# Patient Record
Sex: Male | Born: 1955 | Race: White | Hispanic: No | Marital: Married | Smoking: Never smoker
Health system: Southern US, Community
[De-identification: ages and names within clinical notes are randomized; demographics above are authoritative.]

## PROBLEM LIST (undated history)

## (undated) DIAGNOSIS — R7303 Prediabetes: Secondary | ICD-10-CM

## (undated) DIAGNOSIS — K219 Gastro-esophageal reflux disease without esophagitis: Secondary | ICD-10-CM

## (undated) DIAGNOSIS — E559 Vitamin D deficiency, unspecified: Secondary | ICD-10-CM

## (undated) DIAGNOSIS — E291 Testicular hypofunction: Secondary | ICD-10-CM

## (undated) DIAGNOSIS — I10 Essential (primary) hypertension: Secondary | ICD-10-CM

## (undated) DIAGNOSIS — E785 Hyperlipidemia, unspecified: Secondary | ICD-10-CM

## (undated) HISTORY — DX: Essential (primary) hypertension: I10

## (undated) HISTORY — DX: Gastro-esophageal reflux disease without esophagitis: K21.9

## (undated) HISTORY — DX: Prediabetes: R73.03

## (undated) HISTORY — DX: Hyperlipidemia, unspecified: E78.5

## (undated) HISTORY — DX: Vitamin D deficiency, unspecified: E55.9

## (undated) HISTORY — DX: Testicular hypofunction: E29.1

---

## 2000-02-28 ENCOUNTER — Encounter: Payer: Self-pay | Admitting: Emergency Medicine

## 2000-02-28 ENCOUNTER — Emergency Department (HOSPITAL_COMMUNITY): Admission: EM | Admit: 2000-02-28 | Discharge: 2000-02-28 | Payer: Self-pay | Admitting: Emergency Medicine

## 2003-11-10 ENCOUNTER — Ambulatory Visit (HOSPITAL_COMMUNITY): Admission: RE | Admit: 2003-11-10 | Discharge: 2003-11-10 | Payer: Self-pay | Admitting: Internal Medicine

## 2003-11-12 ENCOUNTER — Ambulatory Visit (HOSPITAL_COMMUNITY): Admission: RE | Admit: 2003-11-12 | Discharge: 2003-11-12 | Payer: Self-pay | Admitting: Internal Medicine

## 2004-01-26 HISTORY — PX: VASECTOMY: SHX75

## 2005-03-27 HISTORY — PX: URETHRAL DILATION: SUR417

## 2005-09-22 ENCOUNTER — Ambulatory Visit: Payer: Self-pay

## 2006-01-23 ENCOUNTER — Encounter: Admission: RE | Admit: 2006-01-23 | Discharge: 2006-01-23 | Payer: Self-pay | Admitting: Urology

## 2006-01-24 ENCOUNTER — Ambulatory Visit (HOSPITAL_BASED_OUTPATIENT_CLINIC_OR_DEPARTMENT_OTHER): Admission: RE | Admit: 2006-01-24 | Discharge: 2006-01-24 | Payer: Self-pay | Admitting: Urology

## 2007-11-08 IMAGING — CR DG CHEST 2V
2 series · 2 of 2 positions shown · non-contrast
Comparison: none

HISTORY: Urethral stricture, preoperative evaluation

CHEST 2 VIEWS:
Comparison 11/09/2005
Normal heart size, mediastinal contours, pulmonary vascularity.
Minimal prominence at left hilum stable.
Minimal scarring left lung base stable.
No infiltrate, effusion, or mass.
Bones unremarkable.
Very minimal chronic peribronchial thickening noted.

[view not recorded (1 of 2)]
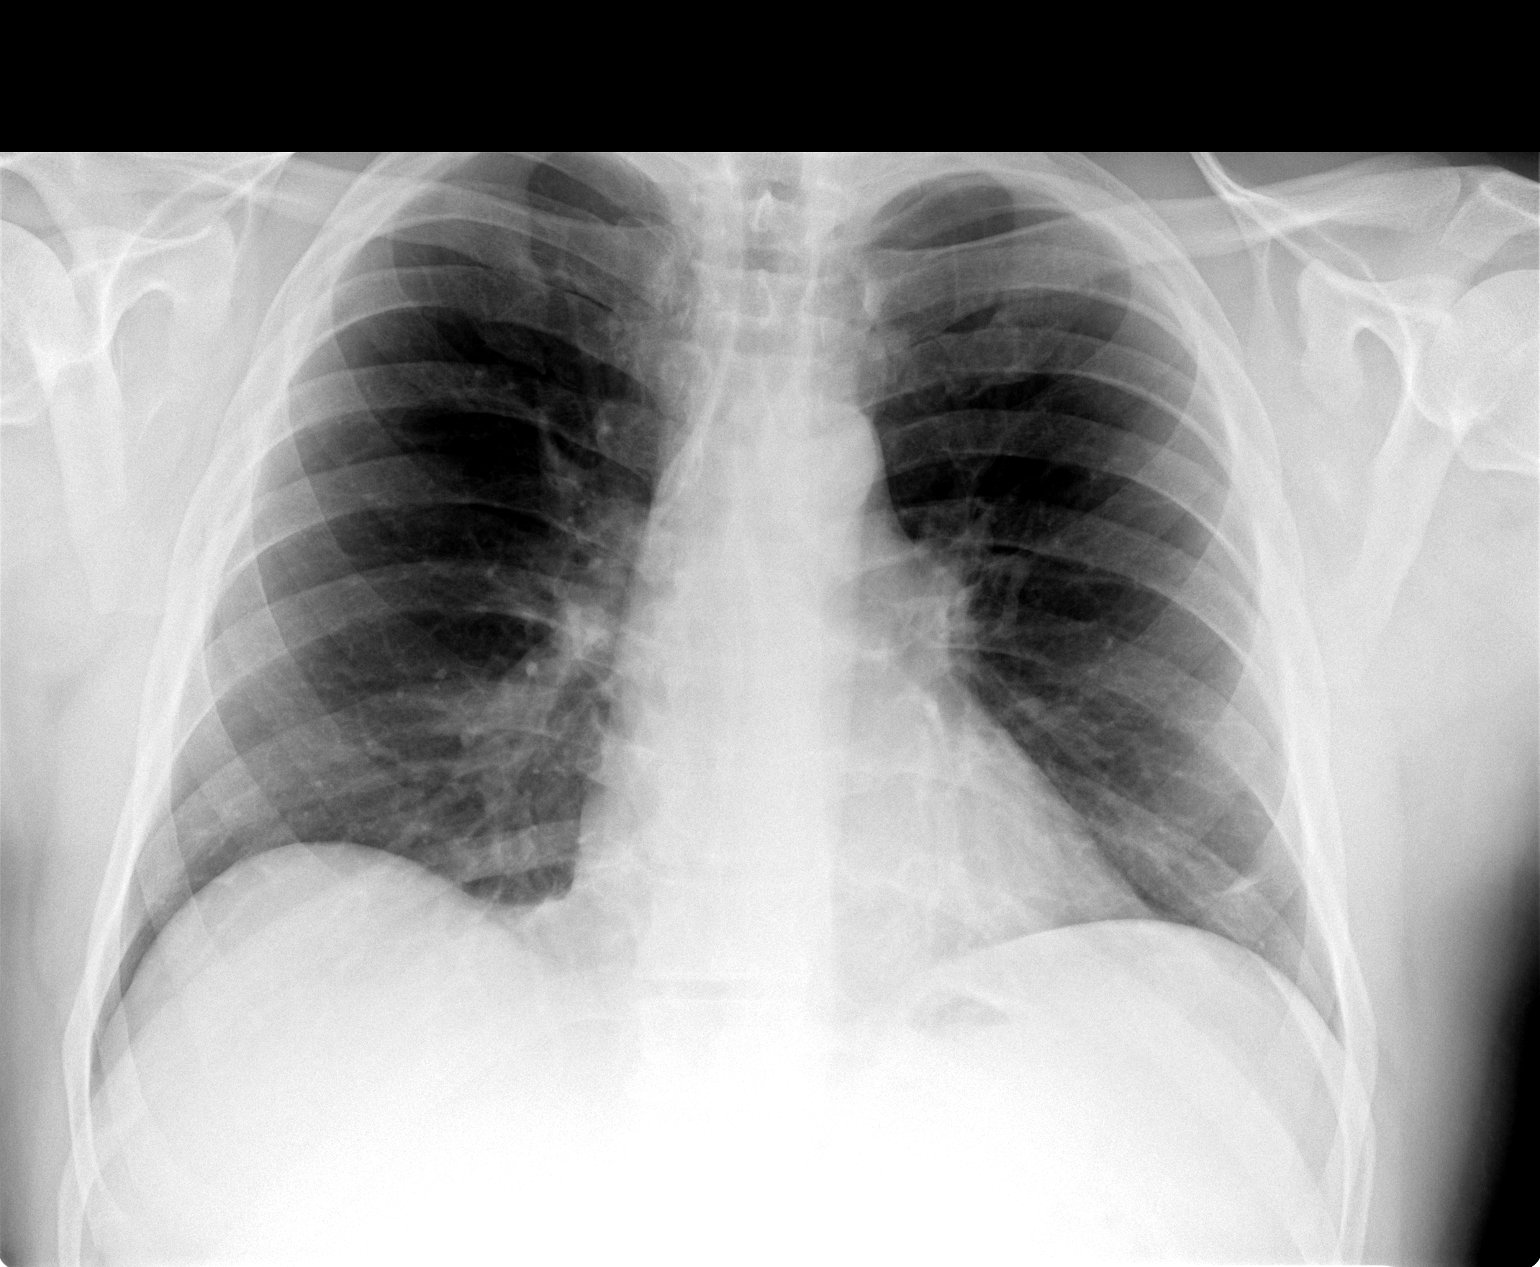

[view not recorded (2 of 2)]
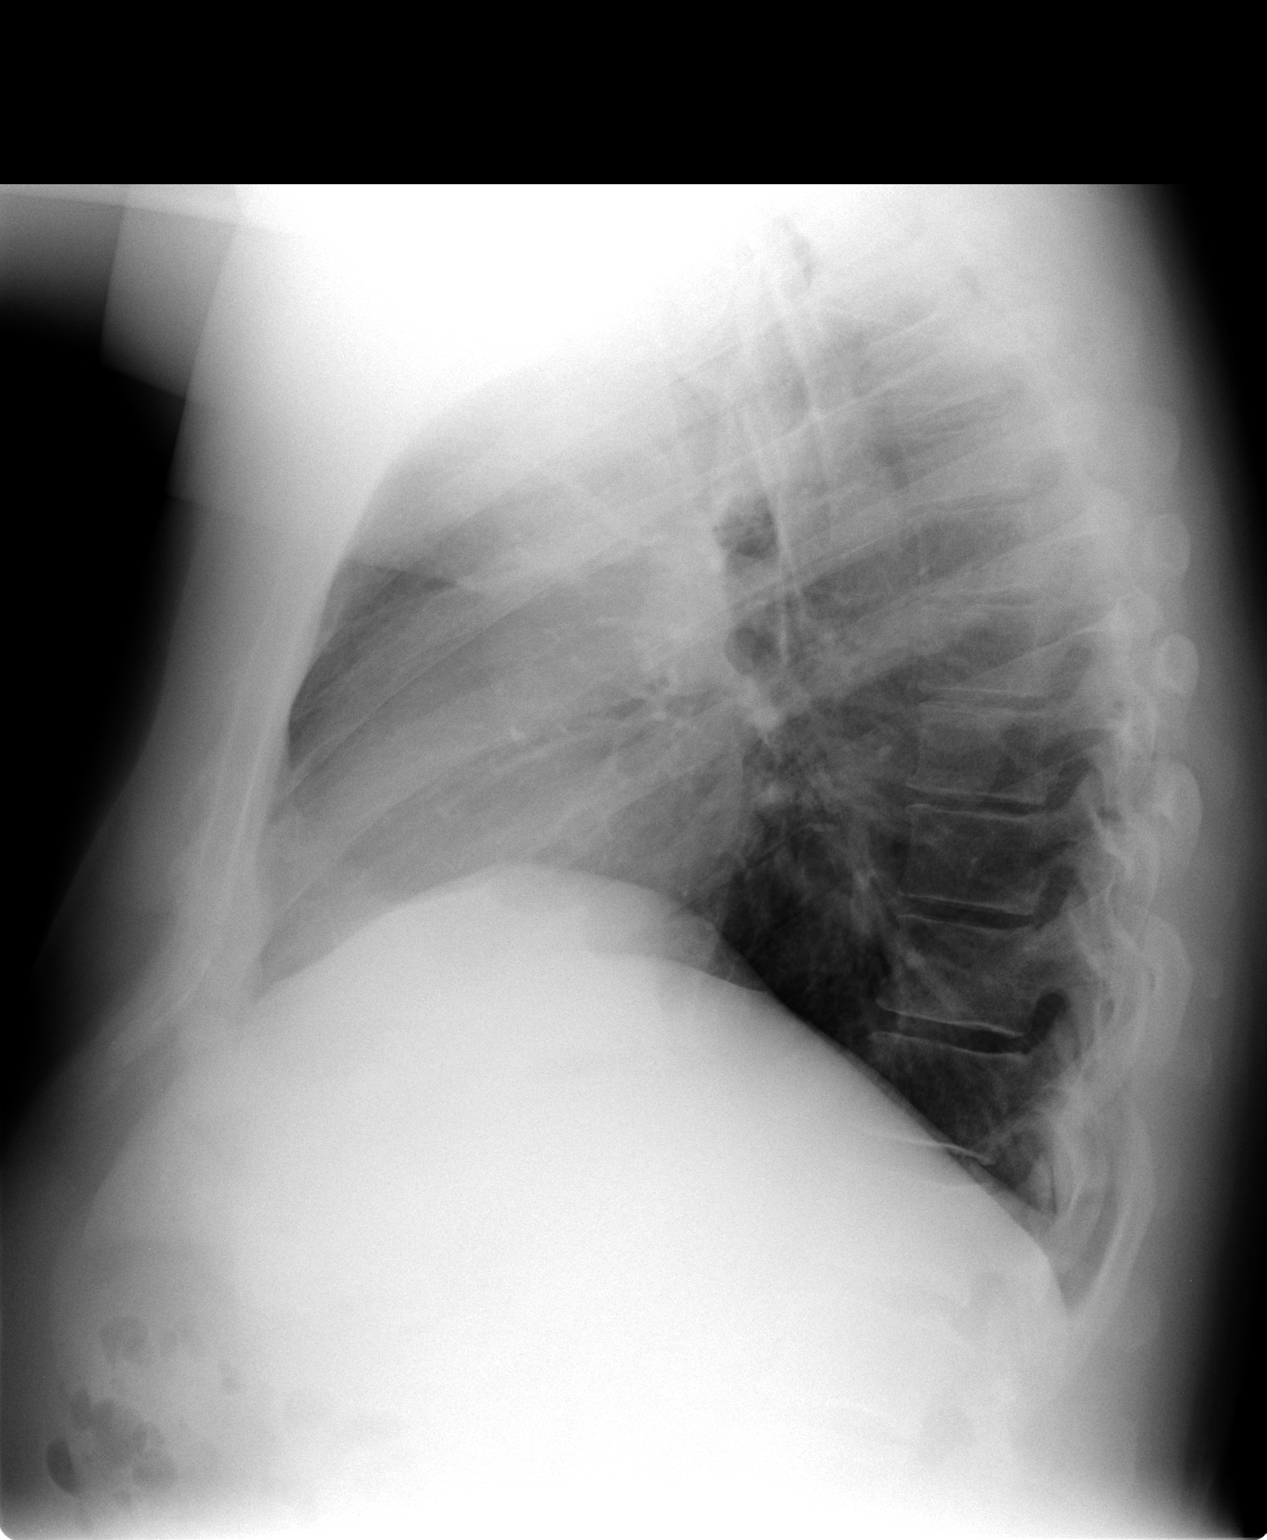

[2 of 2 positions shown; findings below may reference images not displayed]

IMPRESSION: No acute abnormalities as above.

## 2009-10-27 ENCOUNTER — Encounter (INDEPENDENT_AMBULATORY_CARE_PROVIDER_SITE_OTHER): Payer: Self-pay | Admitting: *Deleted

## 2009-10-28 ENCOUNTER — Ambulatory Visit: Payer: Self-pay | Admitting: Gastroenterology

## 2009-11-04 ENCOUNTER — Ambulatory Visit: Payer: Self-pay | Admitting: Gastroenterology

## 2009-11-08 ENCOUNTER — Encounter: Payer: Self-pay | Admitting: Gastroenterology

## 2010-04-28 NOTE — Miscellaneous (Signed)
Summary: [facility] Previsit/prep  Clinical Lists Changes  Medications: Added new medication of MOVIPREP 100 GM  SOLR (PEG-KCL-NACL-NASULF-NA ASC-C) As per prep instructions. - Signed Rx of MOVIPREP 100 GM  SOLR (PEG-KCL-NACL-NASULF-NA ASC-C) As per prep instructions.;  #1 x 0;  Signed;  Entered by: Wyona Almas RN;  Authorized by: Meryl Dare MD [facility];  Method used: Electronically to [facility] [address] [place], [place], [place]  [address], Ph: [phone], Fax: [phone] Allergies: Added new allergy or adverse reaction of CODEINE Observations: Added new observation of NKA: F (10/28/2009 16:12)    Prescriptions: MOVIPREP 100 GM  SOLR (PEG-KCL-NACL-NASULF-NA ASC-C) As per prep instructions.  #1 x 0   Entered by:   Wyona Almas RN   Authorized by:   Meryl Dare MD [facility]   Signed by:   Wyona Almas RN on 10/28/2009   Method used:   Electronically to        [facility] (retail)       [address]       [place], [place]  [address]       Ph: [phone]       Fax: [phone]   RxID:   [id number]

## 2010-04-28 NOTE — Letter (Signed)
Summary: [facility] Instructions  [facility]  [address] [place], [place] [address]   Phone: [phone]  Fax: [phone]       Mark [name]    April 01, 1955    MRN: [id number]        Procedure Day [name]:  Mark [name]  11/04/09     Arrival Time:  7:30AM     Procedure Time:  8:30AM     Location of Procedure:                    Mark [name]  [facility] (4th Floor)                       PREPARATION FOR COLONOSCOPY WITH MOVIPREP   Starting 5 days prior to your procedure 10/30/09 do not eat nuts, seeds, popcorn, corn, beans, peas,  salads, or any raw vegetables.  Do not take any fiber supplements (e.g. Metamucil, Citrucel, and Benefiber).  THE DAY BEFORE YOUR PROCEDURE         DATE: 11/03/09  DAY: WEDNESDAY  1.  Drink clear liquids the entire day-NO SOLID FOOD  2.  Do not drink anything colored red or purple.  Avoid juices with pulp.  No orange juice.  3.  Drink at least 64 oz. (8 glasses) of fluid/clear liquids during the day to prevent dehydration and help the prep work efficiently.  CLEAR LIQUIDS INCLUDE: Water Jello Ice Popsicles Tea (sugar ok, no milk/cream) Powdered fruit flavored drinks Coffee (sugar ok, no milk/cream) Gatorade Juice: apple, white grape, white cranberry  Lemonade Clear bullion, consomm, broth Carbonated beverages (any kind) Strained chicken noodle soup Hard Candy                             4.  In the morning, mix first dose of MoviPrep solution:    Empty 1 Pouch A and 1 Pouch B into the disposable container    Add lukewarm drinking water to the top line of the container. Mix to dissolve    Refrigerate (mixed solution should be used within 24 hrs)  5.  Begin drinking the prep at 5:00 p.m. The MoviPrep container is divided by 4 marks.   Every 15 minutes drink the solution down to the next mark (approximately 8 oz) until the full liter is complete.   6.  Follow completed prep with 16 oz of clear liquid of your choice (Nothing  red or purple).  Continue to drink clear liquids until bedtime.  7.  Before going to bed, mix second dose of MoviPrep solution:    Empty 1 Pouch A and 1 Pouch B into the disposable container    Add lukewarm drinking water to the top line of the container. Mix to dissolve    Refrigerate  THE DAY OF YOUR PROCEDURE      DATE: 11/04/09  DAY: THURSDAY  Beginning at 3:30AM (5 hours before procedure):         1. Every 15 minutes, drink the solution down to the next mark (approx 8 oz) until the full liter is complete.  2. Follow completed prep with 16 oz. of clear liquid of your choice.    3. You may drink clear liquids until 6:30AM (2 HOURS BEFORE PROCEDURE).   MEDICATION INSTRUCTIONS  Unless otherwise instructed, you should take regular prescription medications with a small sip of water   as early as possible the morning of  your procedure.    Additional medication instructions:  Hold Bisopr/HCTZ the day of procedure.         OTHER INSTRUCTIONS  You will need a responsible adult at least 56 years of age to accompany you and drive you home.   This person must remain in the waiting room during your procedure.  Wear loose fitting clothing that is easily removed.  Leave jewelry and other valuables at home.  However, you may wish to bring a book to read or  an iPod/MP3 player to listen to music as you wait for your procedure to start.  Remove all body piercing jewelry and leave at home.  Total time from sign-in until discharge is approximately 2-3 hours.  You should go home directly after your procedure and rest.  You can resume normal activities the  day after your procedure.  The day of your procedure you should not:   Drive   Make legal decisions   Operate machinery   Drink alcohol   Return to work  You will receive specific instructions about eating, activities and medications before you leave.    The above instructions have been reviewed and explained to me  by   Mark Almas RN  October 28, 2009 4:42 PM     I fully understand and can verbalize these instructions _____________________________ Date _________

## 2010-04-28 NOTE — Letter (Signed)
Summary: Patient Notice-Hyperplastic Polyps  [facility]  [address] [place], [place] [address]   Phone: [phone]  Fax: [phone]        November 08, 2009 MRN: [id number]    Mark [name] [address] [place], [place]  [address]    Dear [name],  I am pleased to inform you that the colon polyp(s) removed during your recent colonoscopy was (were) found to be hyperplastic. These types of polyps are NOT pre-cancerous.  It is my recommendation that you have a repeat colonoscopy examination in 10 years for routine colorectal cancer screening.  Should you develop new or worsening symptoms of abdominal pain, bowel habit changes or bleeding from the rectum or bowels, please schedule an evaluation with either your primary care physician or with me.  Continue treatment plan as outlined the day of your exam.  Please call us if you are having persistent problems or have questions about your condition that have not been fully answered at this time.  Sincerely,  Meryl Dare MD [facility]  This letter has been electronically signed by your physician.  Appended Document: Patient Notice-Hyperplastic Polyps letter mailed

## 2010-04-28 NOTE — Procedures (Signed)
Summary: Colonoscopy  Patient: [name] Note: All result statuses are Final unless otherwise noted.  Tests: (1) Colonoscopy (COL)   COL Colonoscopy           DONE     [facility]     [address]     [place], [place]  [address]           COLONOSCOPY PROCEDURE REPORT     PATIENT:  Mark [name], Mark [name]  MR#:  [id number]     BIRTHDATE:  November 19, 1955, 53 yrs. old  GENDER:  male     ENDOSCOPIST:  Judie Petit T. Russella Dar, MD, [facility]     Referred by:  Lucky Cowboy, M.D.     PROCEDURE DATE:  11/04/2009     PROCEDURE:  Colonoscopy with biopsy     ASA CLASS:  Class II     INDICATIONS:  1) Routine Risk Screening     MEDICATIONS:   Fentanyl 50 mcg IV, Versed 7 mg IV     DESCRIPTION OF PROCEDURE:   After the risks benefits and     alternatives of the procedure were thoroughly explained, informed     consent was obtained.  Digital rectal exam was performed and     revealed no abnormalities.   The LB PCF-H180AL [id number] endoscope     was introduced through the anus and advanced to the cecum, which     was identified by both the appendix and ileocecal valve, without     limitations.  The quality of the prep was excellent, using     MoviPrep.  The instrument was then slowly withdrawn as the colon     was fully examined.     <<PROCEDUREIMAGES>>     FINDINGS:  A sessile polyp was found at the hepatic flexure. It     was 4 mm in size. The polyp was removed using cold biopsy forceps.     A sessile polyp was found in the sigmoid colon. It was 4 mm in     size. The polyp was removed using cold biopsy forceps.  A normal     appearing cecum, ileocecal valve, and appendiceal orifice were     identified. The ascending, transverse, splenic flexure, descending     colon, and rectum appeared unremarkable.   Retroflexed views in     the rectum revealed no abnormalities.  The time to cecum =  2     minutes. The scope was then withdrawn (time =  9.67  min) from the     patient and the procedure completed.       COMPLICATIONS:  None           ENDOSCOPIC IMPRESSION:     1) 4 mm sessile polyp at the hepatic flexure     2) 4 mm sessile polyp in the sigmoid colon           RECOMMENDATIONS:     1) Await pathology results     2) If the polyps removed today are adenomatous (pre-cancerous),     you will need a repeat colonoscopy in 5 years. Otherwise you     should continue to follow colorectal cancer screening guidelines     for "routine risk" patients with colonoscopy in 10 years.     Venita Lick. Russella Dar, MD, [facility]           n.     eSIGNED:   Venita Lick. Stark at 11/04/2009 08:44 AM  Mark [name], Mark [name], [id number]  Note: An exclamation mark (!) indicates a result that was not dispersed into the flowsheet. Document Creation Date: 11/04/2009 8:44 AM _______________________________________________________________________  (1) Order result status: Final Collection or observation date-time: 11/04/2009 08:40 Requested date-time:  Receipt date-time:  Reported date-time:  Referring Physician:   Ordering Physician: Claudette Head [phone]) Specimen Source:  Source: Launa Grill Order Number: [id number] Lab site:   Appended Document: Colonoscopy     Procedures Next Due Date:    Colonoscopy: 10/2019

## 2010-08-12 NOTE — Op Note (Signed)
[name], Mark [name]                ACCOUNT NO.:  [id number]   MEDICAL RECORD NO.:  [id number]          PATIENT TYPE:  AMB   LOCATION:  [facility]                         [facility]ACILITY[facility]   PHYSICIAN:  Courtney Paris, M.D.DATE OF BIRTH:  21-Oct-1955   DATE OF PROCEDURE:  01/24/2006  DATE OF DISCHARGE:                                 OPERATIVE REPORT   PREOPERATIVE DIAGNOSES:  1. Urethral stricture.  2. Elective sterilization.   POSTOPERATIVE DIAGNOSES:  1. Urethral stricture.  2. Elective sterilization.   OPERATION:  Bilateral vas ligation and direct vision internal urethrotomy.   ANESTHESIA:  General.   SURGEON:  Dr. 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  He was previously dilated about 10-12 years ago, wore a  catheter for about a week afterwards.  He presents with a weak stream.  He  had questionable UTI off and on for 3 months.  He also has 3 children and  wishes elective sterilization at this time in the office.  He had a very  tight anterior stricture which was visualized but not dilated as he comes in  for treatment of this at this time.   The patient was placed on the operating table in dorsal lithotomy position.  After satisfactory induction of general anesthesia, was prepped and draped  with Betadine and given IV Cipro.  The scrotum was shaved and prepped.  The  left vas was picked up in the upper scrotum.  A nonscalpel technique was  used to make an opening in the skin.  The vas was delivered through this,  ligated, and cut and the ends tied off with a 3-0 chromic catgut suture.  This was dropped back in the left hemiscrotal compartment and on the right  side, a similar procedure was made.  A small opening with the sharpened  hemostat was made.  The vas was delivered through this and, again, a small  segment was cut and ligated with 3-0 chromic catgut suture.  There is very  little bleeding, and  there was good separation between the 2 cut ends of the  vas on each side.  There was a single stitch placed with 4-0 chromic on each  incision.  Next, attention was then turned to the urethra.  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  Using  the camera, pictures were made of the stricture.  At first, it seemed to be  about a centimeter long.  The #3 whistle-tip ureteral catheter was passed  through this without difficulty and using the curved blade at 12 o'clock,  the stricture was opened.  They really did not bleed much.  I was able get  to fairly easily into the bladder.  He had a mildly enlarged prostate.  The  bladder mucosal lesions were seen.  As I came out and incised a little bit  more anteriorly, made a picture of the final opening, and removed the  instrument and passed a #22 Foley catheter  without difficulty up into  the bladder, the irrigant was clear.  The patient  was then taken to the recovery room in good condition and later sent home  with this catheter which he will have as a stent for 2 weeks and come back  to the office in follow up.  Postop instructions were given.      Courtney Paris, M.D.  Electronically Signed     HMK/MEDQ  D:  01/24/2006  T:  01/24/2006  Job:  [id number]

## 2013-02-03 ENCOUNTER — Ambulatory Visit: Payer: Self-pay

## 2013-02-03 VITALS — BP 122/70 | HR 64 | Temp 98.1°F | Resp 16 | Ht 72.5 in | Wt 241.0 lb

## 2013-02-03 DIAGNOSIS — E291 Testicular hypofunction: Secondary | ICD-10-CM

## 2013-02-03 MED ORDER — TESTOSTERONE CYPIONATE 200 MG/ML IM SOLN
400.0000 mg | Freq: Once | INTRAMUSCULAR | Status: AC
  Administered 2013-02-03: 400 mg via INTRAMUSCULAR

## 2013-02-03 NOTE — Addendum Note (Signed)
Addended by: Ralph Leyden A on: 02/03/2013 04:47 PM   Modules accepted: Level of Service

## 2013-02-03 NOTE — Progress Notes (Signed)
Patient ID: Mark [name], male   DOB: [birth year], 57 y.o.   MRN: [id number] Patient received testosterone injection, 2 cc right glut, tolerated well.

## 2013-02-03 NOTE — Progress Notes (Signed)
Patient ID: Mark [name], male   DOB: [birth year], 57 y.o.   MRN: [id number]

## 2013-02-13 ENCOUNTER — Encounter: Payer: Self-pay | Admitting: Internal Medicine

## 2013-03-13 ENCOUNTER — Ambulatory Visit (INDEPENDENT_AMBULATORY_CARE_PROVIDER_SITE_OTHER): Payer: BC Managed Care – PPO

## 2013-03-13 DIAGNOSIS — E291 Testicular hypofunction: Secondary | ICD-10-CM

## 2013-03-13 MED ORDER — TESTOSTERONE CYPIONATE 200 MG/ML IM SOLN
400.0000 mg | Freq: Once | INTRAMUSCULAR | Status: AC
  Administered 2013-03-13: 400 mg via INTRAMUSCULAR

## 2013-03-13 NOTE — Progress Notes (Signed)
Patient ID: Mark [name], male   DOB: 03-Feb-1956, 57 y.o.   MRN: [id number]  Patient here today for testosterone injection, received 2 cc IM left glut.

## 2013-04-17 ENCOUNTER — Encounter: Payer: Self-pay | Admitting: Physician Assistant

## 2013-04-22 DIAGNOSIS — E785 Hyperlipidemia, unspecified: Secondary | ICD-10-CM | POA: Insufficient documentation

## 2013-04-22 DIAGNOSIS — E119 Type 2 diabetes mellitus without complications: Secondary | ICD-10-CM | POA: Insufficient documentation

## 2013-04-22 DIAGNOSIS — E1169 Type 2 diabetes mellitus with other specified complication: Secondary | ICD-10-CM | POA: Insufficient documentation

## 2013-04-22 DIAGNOSIS — I10 Essential (primary) hypertension: Secondary | ICD-10-CM | POA: Insufficient documentation

## 2013-04-22 DIAGNOSIS — E559 Vitamin D deficiency, unspecified: Secondary | ICD-10-CM | POA: Insufficient documentation

## 2013-04-22 DIAGNOSIS — E349 Endocrine disorder, unspecified: Secondary | ICD-10-CM | POA: Insufficient documentation

## 2013-04-22 DIAGNOSIS — K219 Gastro-esophageal reflux disease without esophagitis: Secondary | ICD-10-CM | POA: Insufficient documentation

## 2013-04-23 ENCOUNTER — Ambulatory Visit (INDEPENDENT_AMBULATORY_CARE_PROVIDER_SITE_OTHER): Payer: BC Managed Care – PPO | Admitting: Physician Assistant

## 2013-04-23 ENCOUNTER — Encounter: Payer: Self-pay | Admitting: Physician Assistant

## 2013-04-23 VITALS — BP 138/82 | HR 80 | Temp 98.1°F | Resp 16 | Ht 72.5 in | Wt 241.0 lb

## 2013-04-23 DIAGNOSIS — Z125 Encounter for screening for malignant neoplasm of prostate: Secondary | ICD-10-CM

## 2013-04-23 DIAGNOSIS — E559 Vitamin D deficiency, unspecified: Secondary | ICD-10-CM

## 2013-04-23 DIAGNOSIS — I10 Essential (primary) hypertension: Secondary | ICD-10-CM

## 2013-04-23 DIAGNOSIS — Z0000 Encounter for general adult medical examination without abnormal findings: Secondary | ICD-10-CM

## 2013-04-23 DIAGNOSIS — Z79899 Other long term (current) drug therapy: Secondary | ICD-10-CM

## 2013-04-23 LAB — CBC WITH DIFFERENTIAL/PLATELET
BASOS PCT: 0 % (ref 0–1)
Basophils Absolute: 0 10*3/uL (ref 0.0–0.1)
Eosinophils Absolute: 0.2 10*3/uL (ref 0.0–0.7)
Eosinophils Relative: 3 % (ref 0–5)
HCT: 43.6 % (ref 39.0–52.0)
HEMOGLOBIN: 14.8 g/dL (ref 13.0–17.0)
LYMPHS ABS: 2.6 10*3/uL (ref 0.7–4.0)
Lymphocytes Relative: 30 % (ref 12–46)
MCH: 29.4 pg (ref 26.0–34.0)
MCHC: 33.9 g/dL (ref 30.0–36.0)
MCV: 86.7 fL (ref 78.0–100.0)
Monocytes Absolute: 0.7 10*3/uL (ref 0.1–1.0)
Monocytes Relative: 8 % (ref 3–12)
NEUTROS PCT: 59 % (ref 43–77)
Neutro Abs: 5 10*3/uL (ref 1.7–7.7)
PLATELETS: 229 10*3/uL (ref 150–400)
RBC: 5.03 MIL/uL (ref 4.22–5.81)
RDW: 13.4 % (ref 11.5–15.5)
WBC: 8.5 10*3/uL (ref 4.0–10.5)

## 2013-04-23 NOTE — Progress Notes (Signed)
Complete Physical HPI Patient presents for complete physical.   Patient's blood pressure has been controlled at home. Patient denies chest pain, shortness of breath, dizziness. BP: 138/82 mmHg  Patient's cholesterol is diet controlled. They are on Pravastatin and denies myalgias. The patient's cholesterol last visit was LDL 121, trigs 222.  CPK was 244. Hypogonadism was 153- has been a long time since his last testosterone. Vitamin D 26.  The patient has been working on diet and exercise for prediabetes, denies changes in vision, polys, and paresthesias. Last A1C in office was 6.3.   Current Medications:  Current Outpatient Prescriptions on File Prior to Visit  Medication Sig Dispense Refill  . aspirin 81 MG chewable tablet Chew by mouth daily.      . bisoprolol-hydrochlorothiazide (ZIAC) 5-6.25 MG per tablet Take 1 tablet by mouth daily.      . fenofibrate micronized (LOFIBRA) 134 MG capsule Take 134 mg by mouth daily before breakfast.      . pravastatin (PRAVACHOL) 40 MG tablet Take 40 mg by mouth daily.      . ranitidine (ZANTAC) 300 MG tablet Take 300 mg by mouth at bedtime.      Marland Kitchen testosterone cypionate (DEPOTESTOTERONE CYPIONATE) 200 MG/ML injection Inject 400 mg into the muscle every 14 (fourteen) days.       No current facility-administered medications on file prior to visit.   Health Maintenance:  Tetanus: 2007 Pneumovax: 2013 Flu vaccine: N/A Zostavax:N/A DEXA: N/A Colonoscopy: 10/2009 due 10 years EGD:N/A  Allergies:  Allergies  Allergen Reactions  . Codeine     REACTION: Anxiety  . Sertraline Swelling   Medical History:  Past Medical History  Diagnosis Date  . Hyperlipidemia   . Hypertension   . Prediabetes   . GERD (gastroesophageal reflux disease)   . Vitamin D deficiency   . Other testicular hypofunction    Surgical History:  Past Surgical History  Procedure Laterality Date  . Vasectomy  01/2004  . Urethral dilation  2007   Family History:  Family  History  Problem Relation Age of Onset  . Hypertension Mother   . Hyperlipidemia Mother   . COPD Father   . Heart disease Father   . Hypertension Father    Social History:  History   Social History  . Marital Status: Married    Spouse Name: N/A    Number of Children: N/A  . Years of Education: N/A   Occupational History  . Not on file.   Social History Main Topics  . Smoking status: Never Smoker   . Smokeless tobacco: Not on file  . Alcohol Use: No  . Drug Use: No  . Sexual Activity: Not on file   Other Topics Concern  . Not on file   Social History Narrative  . No narrative on file   ROS Constitutional: Denies weight loss/gain, headaches, insomnia, fatigue, night sweats, and change in appetite. Eyes: Denies redness, blurred vision, diplopia, discharge, itchy, watery eyes.  ENT: Denies discharge, congestion, post nasal drip, sore throat, earache, hearing loss, dental pain, Tinnitus, Vertigo, Sinus pain, snoring.  Cardio: Denies chest pain, palpitations, irregular heartbeat, dyspnea, diaphoresis, orthopnea, PND, claudication, edema Respiratory: denies cough, dyspnea, pleurisy, hoarseness, wheezing.  Gastrointestinal: Dr. Russella Dar  Denies dysphagia, heartburn, pain, cramps, nausea, vomiting, bloating, diarrhea, constipation, hematemesis, melena, hematochezia, hemorrhoids Genitourinary: Denies dysuria, frequency, urgency, nocturia, hesitancy, discharge, hematuria, flank pain Musculoskeletal: Denies arthralgia, myalgia, stiffness, Jt. Swelling, pain, Skin: Denies pruritis, rash, hives,  acne, eczema, changing in skin lesion  Neuro: Denies Weakness, tremor, incoordination, spasms, paresthesia, pain Psychiatric: Denies confusion, memory loss, sensory loss Endocrine: Denies change in weight, skin, hair change, nocturia, and paresthesia, Diabetic Denies Polys, visual blurring, hyper /hypo glycemic episodes.  Heme/Lymph: Denies Excessive bleeding, bruising, enlarged lymph  nodes  Physical Exam: Estimated body mass index is 32.22 kg/(m^2) as calculated from the following:   Height as of this encounter: 6' 0.5" (1.842 m).   Weight as of this encounter: 241 lb (109.317 kg). Filed Vitals:   04/23/13 1355  BP: 138/82  Pulse: 80  Temp: 98.1 F (36.7 C)  Resp: 16   General Appearance: Well nourished, in no apparent distress. Eyes: PERRLA, EOMs, conjunctiva no swelling or erythema, normal fundi and vessels. Sinuses: No Frontal/maxillary tenderness ENT/Mouth: Ext aud canals clear, normal light reflex with TMs without erythema, bulging. Good dentition. No erythema, swelling, or exudate on post pharynx. Tonsils not swollen or erythematous. Hearing normal.  Neck: Supple, thyroid normal. No bruits Respiratory: Respiratory effort normal, BS equal bilaterally without rales, rhonchi, wheezing or stridor. Cardio: RRR without murmurs, rubs or gallops. Brisk peripheral pulses without edema.  Chest: symmetric, with normal excursions and percussion. Abdomen: Soft, +BS. Non tender, no guarding, rebound, hernias, masses, or organomegaly. .  Lymphatics: Non tender without lymphadenopathy.  Genitourinary: defer Musculoskeletal: Full ROM all peripheral extremities,5/5 strength, and normal gait. Skin: Warm, dry without rashes, lesions, ecchymosis.  Neuro: Cranial nerves intact, reflexes equal bilaterally. Normal muscle tone, no cerebellar symptoms. Sensation intact.  Psych: Awake and oriented X 3, normal affect, Insight and Judgment appropriate.   EKG: WNL no changes.  Assessment and Plan: Hyperlipidemia- check lipids, continue diet/exercise and medications  Hypertension- at goal, continue diet/exercise  Prediabetes- discussed diet/exercise, check A1C   GERD (gastroesophageal reflux disease)- continue medication  Vitamin D deficiency- check level, continue supplement  Other testicular hypofunction- continue injections, check level    Discussed med's effects and SE's.  Screening labs and tests as requested with regular follow-up as recommended.  Quentin Mullingollier, Grenda Lora 2:01 PM

## 2013-04-23 NOTE — Patient Instructions (Signed)
   Bad carbs also include fruit juice, alcohol, and sweet tea. These are empty calories that do not signal to your brain that you are full.   Please remember the good carbs are still carbs which convert into sugar. So please measure them out no more than 1/2-1 cup of rice, oatmeal, pasta, and beans.  Veggies are however free foods! Pile them on.   I like lean protein at every meal such as chicken, turkey, pork chops, cottage cheese, etc. Just do not fry these meats and please center your meal around vegetable, the meats should be a side dish.   No all fruit is created equal. Please see the list below, the fruit at the bottom is higher in sugars than the fruit at the top   We want weight loss that will last so you should lose 1-2 pounds a week.  THAT IS IT! Please pick THREE things a month to change. Once it is a habit check off the item. Then pick another three items off the list to become habits.  If you are already doing a habit on the list GREAT!  Cross that item off! o Don't drink your calories. Ie, alcohol, soda, fruit juice, and sweet tea.  o Drink more water. Drink a glass when you feel hungry or before each meal.  o Eat breakfast - Complex carb and protein (likeDannon light and fit yogurt, oatmeal, fruit, eggs, turkey bacon). o Measure your cereal.  Eat no more than one cup a day. (ie Kashi) o Eat an apple a day. o Add a vegetable a day. o Try a new vegetable a month. o Use Pam! Stop using oil or butter to cook. o Don't finish your plate or use smaller plates. o Share your dessert. o Eat sugar free Jello for dessert or frozen grapes. o Don't eat 2-3 hours before bed. o Switch to whole wheat bread, pasta, and brown rice. o Make healthier choices when you eat out. No fries! o Pick baked chicken, NOT fried. o Don't forget to SLOW DOWN when you eat. It is not going anywhere.  o Take the stairs. o Park far away in the parking lot o Lift soup cans (or weights) for 10 minutes while  watching TV. o Walk at work for 10 minutes during break. o Walk outside 1 time a week with your friend, kids, dog, or significant other. o Start a walking group at church. o Walk the mall as much as you can tolerate.  o Keep a food diary. o Weigh yourself daily. o Walk for 15 minutes 3 days per week. o Cook at home more often and eat out less.  If life happens and you go back to old habits, it is okay.  Just start over. You can do it!   If you experience chest pain, get short of breath, or tired during the exercise, please stop immediately and inform your doctor.   

## 2013-04-24 ENCOUNTER — Telehealth: Payer: Self-pay

## 2013-04-24 LAB — HEPATIC FUNCTION PANEL
ALT: 28 U/L (ref 0–53)
AST: 18 U/L (ref 0–37)
Albumin: 4.3 g/dL (ref 3.5–5.2)
Alkaline Phosphatase: 109 U/L (ref 39–117)
BILIRUBIN DIRECT: 0.1 mg/dL (ref 0.0–0.3)
BILIRUBIN INDIRECT: 0.2 mg/dL (ref 0.2–1.2)
BILIRUBIN TOTAL: 0.3 mg/dL (ref 0.2–1.2)
Total Protein: 7 g/dL (ref 6.0–8.3)

## 2013-04-24 LAB — TESTOSTERONE: Testosterone: 198 ng/dL — ABNORMAL LOW (ref 300–890)

## 2013-04-24 LAB — BASIC METABOLIC PANEL WITH GFR
BUN: 17 mg/dL (ref 6–23)
CALCIUM: 9.2 mg/dL (ref 8.4–10.5)
CO2: 29 meq/L (ref 19–32)
Chloride: 101 mEq/L (ref 96–112)
Creat: 1 mg/dL (ref 0.50–1.35)
GFR, Est Non African American: 83 mL/min
Glucose, Bld: 152 mg/dL — ABNORMAL HIGH (ref 70–99)
Potassium: 4.4 mEq/L (ref 3.5–5.3)
SODIUM: 138 meq/L (ref 135–145)

## 2013-04-24 LAB — URINALYSIS, ROUTINE W REFLEX MICROSCOPIC
Bilirubin Urine: NEGATIVE
Glucose, UA: 250 mg/dL — AB
Hgb urine dipstick: NEGATIVE
Ketones, ur: NEGATIVE mg/dL
LEUKOCYTES UA: NEGATIVE
Nitrite: NEGATIVE
PH: 6 (ref 5.0–8.0)
Protein, ur: NEGATIVE mg/dL
SPECIFIC GRAVITY, URINE: 1.02 (ref 1.005–1.030)
UROBILINOGEN UA: 0.2 mg/dL (ref 0.0–1.0)

## 2013-04-24 LAB — IRON AND TIBC
%SAT: 22 % (ref 20–55)
IRON: 54 ug/dL (ref 42–165)
TIBC: 248 ug/dL (ref 215–435)
UIBC: 194 ug/dL (ref 125–400)

## 2013-04-24 LAB — MICROALBUMIN / CREATININE URINE RATIO
Creatinine, Urine: 146.1 mg/dL
MICROALB/CREAT RATIO: 3.6 mg/g (ref 0.0–30.0)
Microalb, Ur: 0.53 mg/dL (ref 0.00–1.89)

## 2013-04-24 LAB — LIPID PANEL
CHOL/HDL RATIO: 5.8 ratio
CHOLESTEROL: 220 mg/dL — AB (ref 0–200)
HDL: 38 mg/dL — AB (ref >39)
TRIGLYCERIDES: 457 mg/dL — AB (ref <150)

## 2013-04-24 LAB — PSA: PSA: 1.53 ng/mL (ref <=4.00)

## 2013-04-24 LAB — HEMOGLOBIN A1C
Hgb A1c MFr Bld: 7 % — ABNORMAL HIGH (ref <5.7)
Mean Plasma Glucose: 154 mg/dL — ABNORMAL HIGH (ref <117)

## 2013-04-24 LAB — INSULIN, FASTING: Insulin fasting, serum: 74 u[IU]/mL — ABNORMAL HIGH (ref 3–28)

## 2013-04-24 LAB — TSH: TSH: 1.416 u[IU]/mL (ref 0.350–4.500)

## 2013-04-24 LAB — FERRITIN: Ferritin: 352 ng/mL — ABNORMAL HIGH (ref 22–322)

## 2013-04-24 LAB — VITAMIN D 25 HYDROXY (VIT D DEFICIENCY, FRACTURES): Vit D, 25-Hydroxy: 29 ng/mL — ABNORMAL LOW (ref 30–89)

## 2013-04-24 LAB — VITAMIN B12: Vitamin B-12: 373 pg/mL (ref 211–911)

## 2013-04-24 LAB — MAGNESIUM: Magnesium: 2 mg/dL (ref 1.5–2.5)

## 2013-04-24 MED ORDER — METFORMIN HCL ER 500 MG PO TB24: 1000.0000 mg | ORAL_TABLET | Freq: Two times a day (BID) | ORAL | Status: DC

## 2013-04-24 NOTE — Addendum Note (Signed)
Addended by: Quentin MullingOLLIER, Alfonzia Woolum R on: 04/24/2013 08:09 AM   Modules accepted: Orders

## 2013-04-24 NOTE — Progress Notes (Signed)
PT CALLED SAID HE KNOWS WHAT TO DO & IS GOING TO WORK ON IT WILL CALL US BACK WITH ANY QUESTIONS OR CONCERNS

## 2013-04-24 NOTE — Telephone Encounter (Signed)
Spoke with patient regarding his lab results and instructions, declines medication at this time, states he will stay on a strict diet and come back in 3 months, states he will call back to schedule, also checked [facility] for patient to see when testosterone was last filled, was last filled 11-27-12, advised patient I had refilled

## 2013-04-30 ENCOUNTER — Other Ambulatory Visit: Payer: Self-pay | Admitting: Physician Assistant

## 2013-04-30 MED ORDER — PRAVASTATIN SODIUM 40 MG PO TABS: 40.0000 mg | ORAL_TABLET | Freq: Every day | ORAL | Status: DC

## 2013-04-30 MED ORDER — FENOFIBRATE MICRONIZED 134 MG PO CAPS: 134.0000 mg | ORAL_CAPSULE | Freq: Every day | ORAL | Status: DC

## 2013-05-01 ENCOUNTER — Ambulatory Visit (INDEPENDENT_AMBULATORY_CARE_PROVIDER_SITE_OTHER): Payer: BC Managed Care – PPO | Admitting: *Deleted

## 2013-05-01 ENCOUNTER — Encounter: Payer: Self-pay | Admitting: Internal Medicine

## 2013-05-01 DIAGNOSIS — E291 Testicular hypofunction: Secondary | ICD-10-CM

## 2013-05-01 MED ORDER — TESTOSTERONE CYPIONATE 200 MG/ML IM SOLN
400.0000 mg | Freq: Once | INTRAMUSCULAR | Status: AC
  Administered 2013-05-01: 400 mg via INTRAMUSCULAR

## 2013-06-26 ENCOUNTER — Ambulatory Visit: Payer: Self-pay | Admitting: Internal Medicine

## 2013-06-27 ENCOUNTER — Ambulatory Visit: Payer: Self-pay | Admitting: Emergency Medicine

## 2013-06-27 ENCOUNTER — Ambulatory Visit (INDEPENDENT_AMBULATORY_CARE_PROVIDER_SITE_OTHER): Payer: BC Managed Care – PPO | Admitting: Physician Assistant

## 2013-06-27 VITALS — BP 122/70 | HR 60 | Temp 100.0°F | Resp 16 | Wt 233.0 lb

## 2013-06-27 DIAGNOSIS — J0100 Acute maxillary sinusitis, unspecified: Secondary | ICD-10-CM

## 2013-06-27 DIAGNOSIS — E291 Testicular hypofunction: Secondary | ICD-10-CM

## 2013-06-27 LAB — POC INFLUENZA A&B (BINAX/QUICKVUE)
INFLUENZA B, POC: NEGATIVE
Influenza A, POC: NEGATIVE

## 2013-06-27 MED ORDER — PREDNISONE 20 MG PO TABS: ORAL_TABLET | ORAL | Status: DC

## 2013-06-27 MED ORDER — TESTOSTERONE CYPIONATE 200 MG/ML IM SOLN
400.0000 mg | Freq: Once | INTRAMUSCULAR | Status: AC
  Administered 2013-06-27: 400 mg via INTRAMUSCULAR

## 2013-06-27 MED ORDER — AZITHROMYCIN 250 MG PO TABS: ORAL_TABLET | ORAL | Status: DC

## 2013-06-27 NOTE — Patient Instructions (Signed)

## 2013-06-27 NOTE — Progress Notes (Signed)
   Subjective:    Patient ID: Mark [name], male    DOB: [birth year], 58 y.o.   MRN: [id number]  Cough This is a new problem. Episode onset: 2 days. The problem has been gradually worsening. The problem occurs constantly. The cough is productive of purulent sputum. Associated symptoms include chills, a fever, myalgias, nasal congestion, postnasal drip, rhinorrhea and a sore throat. Pertinent negatives include no chest pain, ear congestion, ear pain, headaches, heartburn, hemoptysis, rash, shortness of breath, sweats, weight loss or wheezing. Nothing aggravates the symptoms. Treatments tried: advil, nyquil. The treatment provided no relief.    Review of Systems  Constitutional: Positive for fever and chills. Negative for weight loss.  HENT: Positive for postnasal drip, rhinorrhea and sore throat. Negative for ear pain.   Respiratory: Positive for cough. Negative for hemoptysis, shortness of breath and wheezing.   Cardiovascular: Negative for chest pain.  Gastrointestinal: Negative for heartburn.  Musculoskeletal: Positive for myalgias.  Skin: Negative for rash.  Neurological: Negative for headaches.       Objective:   Physical Exam  Constitutional: He appears well-developed and well-nourished.  HENT:  Head: Normocephalic and atraumatic.  Right Ear: External ear normal.  Left Ear: External ear normal.  Nose: Right sinus exhibits maxillary sinus tenderness. Left sinus exhibits maxillary sinus tenderness.  Mouth/Throat: Posterior oropharyngeal erythema present. No oropharyngeal exudate, posterior oropharyngeal edema or tonsillar abscesses.  Eyes: Conjunctivae are normal. Pupils are equal, round, and reactive to light.  Neck: Normal range of motion. Neck supple.  Cardiovascular: Normal rate, regular rhythm and normal heart sounds.   Pulmonary/Chest: Effort normal and breath sounds normal. He has no wheezes.  Abdominal: Soft. Bowel sounds are normal.  Lymphadenopathy:    He has no  cervical adenopathy.  Skin: Skin is warm and dry.       Assessment & Plan:  Acute maxillary sinusitis - Plan: azithromycin (ZITHROMAX) 250 MG tablet, predniSONE (DELTASONE) 20 MG tablet, POC Influenza A&B  Other testicular hypofunction - Plan: testosterone cypionate (DEPOTESTOTERONE CYPIONATE) injection 400 mg

## 2013-07-23 ENCOUNTER — Ambulatory Visit: Payer: Self-pay | Admitting: Internal Medicine

## 2013-07-30 ENCOUNTER — Encounter: Payer: Self-pay | Admitting: Cardiovascular Disease

## 2013-07-30 ENCOUNTER — Encounter: Payer: Self-pay | Admitting: Cardiology

## 2013-08-12 ENCOUNTER — Encounter: Payer: Self-pay | Admitting: Physician Assistant

## 2013-08-12 ENCOUNTER — Ambulatory Visit (INDEPENDENT_AMBULATORY_CARE_PROVIDER_SITE_OTHER): Payer: BC Managed Care – PPO | Admitting: Physician Assistant

## 2013-08-12 VITALS — BP 122/78 | HR 68 | Temp 98.8°F | Resp 16 | Wt 230.0 lb

## 2013-08-12 DIAGNOSIS — E559 Vitamin D deficiency, unspecified: Secondary | ICD-10-CM

## 2013-08-12 DIAGNOSIS — E291 Testicular hypofunction: Secondary | ICD-10-CM

## 2013-08-12 DIAGNOSIS — R7309 Other abnormal glucose: Secondary | ICD-10-CM

## 2013-08-12 DIAGNOSIS — Z79899 Other long term (current) drug therapy: Secondary | ICD-10-CM

## 2013-08-12 DIAGNOSIS — I10 Essential (primary) hypertension: Secondary | ICD-10-CM

## 2013-08-12 DIAGNOSIS — R7303 Prediabetes: Secondary | ICD-10-CM

## 2013-08-12 DIAGNOSIS — E785 Hyperlipidemia, unspecified: Secondary | ICD-10-CM

## 2013-08-12 LAB — CBC WITH DIFFERENTIAL/PLATELET
BASOS ABS: 0 10*3/uL (ref 0.0–0.1)
Basophils Relative: 0 % (ref 0–1)
Eosinophils Absolute: 0.2 10*3/uL (ref 0.0–0.7)
Eosinophils Relative: 2 % (ref 0–5)
HEMATOCRIT: 40.7 % (ref 39.0–52.0)
HEMOGLOBIN: 13.9 g/dL (ref 13.0–17.0)
LYMPHS PCT: 38 % (ref 12–46)
Lymphs Abs: 2.9 10*3/uL (ref 0.7–4.0)
MCH: 29.4 pg (ref 26.0–34.0)
MCHC: 34.2 g/dL (ref 30.0–36.0)
MCV: 86 fL (ref 78.0–100.0)
MONO ABS: 0.5 10*3/uL (ref 0.1–1.0)
Monocytes Relative: 7 % (ref 3–12)
Neutro Abs: 4 10*3/uL (ref 1.7–7.7)
Neutrophils Relative %: 53 % (ref 43–77)
Platelets: 211 10*3/uL (ref 150–400)
RBC: 4.73 MIL/uL (ref 4.22–5.81)
RDW: 13.7 % (ref 11.5–15.5)
WBC: 7.5 10*3/uL (ref 4.0–10.5)

## 2013-08-12 MED ORDER — TESTOSTERONE CYPIONATE 200 MG/ML IM SOLN
400.0000 mg | Freq: Once | INTRAMUSCULAR | Status: AC
Start: 2013-08-12 — End: 2013-08-12
  Administered 2013-08-12: 400 mg via INTRAMUSCULAR

## 2013-08-12 NOTE — Patient Instructions (Signed)

## 2013-08-12 NOTE — Progress Notes (Signed)
HPI 58 y.o. male  presents for 3 month follow up with hypertension, hyperlipidemia, diabetes and vitamin D. His blood pressure has been controlled at home, today their BP is BP: 122/78 mmHg He does workout, goes to the gym. He denies chest pain, shortness of breath, dizziness.  He is on cholesterol medication and denies myalgias. His cholesterol is not at goal. The cholesterol last visit was:   Lab Results  Component Value Date   CHOL 220* 04/23/2013   HDL 38* 04/23/2013   LDLCALC Comment:   Not calculated due to Triglyceride >400. Suggest ordering Direct LDL (Unit Code: 16109).   Total Cholesterol/HDL Ratio:CHD Risk                        Coronary Heart Disease Risk Table                                        Men       Women          1/2 Average Risk              3.4        3.3              Average Risk              5.0        4.4           2X Average Risk              9.6        7.1           3X Average Risk             23.4       11.0 Use the calculated Patient Ratio above and the CHD Risk table  to determine the patient's CHD Risk. ATP III Classification (LDL):       < 100        mg/dL         Optimal      604 - 129     mg/dL         Near or Above Optimal      130 - 159     mg/dL         Borderline High      160 - 189     mg/dL         High       > 540        mg/dL         Very High   9/81/1914   TRIG 457* 04/23/2013   CHOLHDL 5.8 04/23/2013   He has been working on diet and exercise for diabetes, he has been decreasing white stuff, sodas, and denies paresthesia of the feet, polydipsia and polyuria. Last A1C in the office was:  Lab Results  Component Value Date   HGBA1C 7.0* 04/23/2013   Patient is on Vitamin D supplement.   He is has a history of testosterone deficiency and is on testosterone replacement, he is due today. He states that he feels the shot wears off, gets it every two weeks. He states that the testosterone helps with his energy, libido, muscle mass. Lab Results  Component Value  Date   TESTOSTERONE 198* 04/23/2013    Current Medications:  Current Outpatient  Prescriptions on File Prior to Visit  Medication Sig Dispense Refill  . aspirin 81 MG chewable tablet Chew by mouth daily.      . bisoprolol-hydrochlorothiazide (ZIAC) 5-6.25 MG per tablet Take 1 tablet by mouth daily.      . fenofibrate micronized (LOFIBRA) 134 MG capsule Take 1 capsule (134 mg total) by mouth daily before breakfast.  90 capsule  0  . pravastatin (PRAVACHOL) 40 MG tablet Take 1 tablet (40 mg total) by mouth daily.  90 tablet  0  . ranitidine (ZANTAC) 300 MG tablet Take 300 mg by mouth at bedtime.      Marland Kitchen. testosterone cypionate (DEPOTESTOTERONE CYPIONATE) 200 MG/ML injection Inject 400 mg into the muscle every 14 (fourteen) days.       No current facility-administered medications on file prior to visit.   Medical History:  Past Medical History  Diagnosis Date  . Hyperlipidemia   . Hypertension   . Prediabetes   . GERD (gastroesophageal reflux disease)   . Vitamin D deficiency   . Other testicular hypofunction    Allergies:  Allergies  Allergen Reactions  . Codeine     REACTION: Anxiety  . Sertraline Swelling     Review of Systems: [X]  = complains of  [ ]  = denies  General: Fatigue [ ]  Fever [ ]  Chills [ ]  Weakness [ ]   Insomnia [ ]  Eyes: Redness [ ]  Blurred vision [ ]  Diplopia [ ]   ENT: Congestion [ ]  Sinus Pain [ ]  Post Nasal Drip [ ]  Sore Throat [ ]  Earache [ ]   Cardiac: Chest pain/pressure [ ]  SOB [ ]  Orthopnea [ ]   Palpitations [ ]   Paroxysmal nocturnal dyspnea[ ]  Claudication [ ]  Edema [ ]   Pulmonary: Cough [ ]  Wheezing[ ]   SOB [ ]   Snoring [ ]   GI: Nausea [ ]  Vomiting[ ]  Dysphagia[ ]  Heartburn[ ]  Abdominal pain [ ]  Constipation [ ] ; Diarrhea [ ] ; BRBPR [ ]  Melena[ ]  GU: Hematuria[ ]  Dysuria [ ]  Nocturia[ ]  Urgency [ ]   Hesitancy [ ]  Discharge [ ]  Neuro: Headaches[ ]  Vertigo[ ]  Paresthesias[ ]  Spasm [ ]  Speech changes [ ]  Incoordination [ ]   Ortho: Arthritis [ ]  Joint pain [  ] Muscle pain [ ]  Joint swelling [ ]  Back Pain [ ]  Skin:  Rash [ ]   Pruritis [ ]  Change in skin lesion [ ]   Psych: Depression[ ]  Anxiety[ ]  Confusion [ ]  Memory loss [ ]   Heme/Lypmh: Bleeding [ ]  Bruising [ ]  Enlarged lymph nodes [ ]   Endocrine: Visual blurring [ ]  Paresthesia [ ]  Polyuria [ ]  Polydypsea [ ]    Heat/cold intolerance [ ]  Hypoglycemia [ ]   Family history- Review and unchanged Social history- Review and unchanged Physical Exam: BP 122/78  Pulse 68  Temp(Src) 98.8 F (37.1 C)  Resp 16  Wt 230 lb (104.327 kg) Wt Readings from Last 3 Encounters:  08/12/13 230 lb (104.327 kg)  06/27/13 233 lb (105.688 kg)  04/23/13 241 lb (109.317 kg)   General Appearance: Well nourished, in no apparent distress. Eyes: PERRLA, EOMs, conjunctiva no swelling or erythema Sinuses: No Frontal/maxillary tenderness ENT/Mouth: Ext aud canals clear, TMs without erythema, bulging. No erythema, swelling, or exudate on post pharynx.  Tonsils not swollen or erythematous. Hearing normal.  Neck: Supple, thyroid normal.  Respiratory: Respiratory effort normal, BS equal bilaterally without rales, rhonchi, wheezing or stridor.  Cardio: RRR with no MRGs. Brisk peripheral pulses without edema.  Abdomen: Soft, + BS.  Non tender, no guarding, rebound, hernias, masses. Lymphatics: Non tender without lymphadenopathy.  Musculoskeletal: Full ROM, 5/5 strength, normal gait.  Skin: Warm, dry without rashes, lesions, ecchymosis.  Neuro: Cranial nerves intact. Normal muscle tone, no cerebellar symptoms. Sensation intact.  Psych: Awake and oriented X 3, normal affect, Insight and Judgment appropriate.   Assessment and Plan:  Hypertension: Continue medication, monitor blood pressure at home. Continue DASH diet. Cholesterol: Continue diet and exercise. Check cholesterol.  Diabetes-Continue diet and exercise. Check A1C Vitamin D Def- check level and continue medications.  Hypogonadism- continue to monitor, states  medication is helping with symptoms of low T but feels wearing off.    Continue diet and meds as discussed. Further disposition pending results of labs.  Quentin MullingAmanda Harlyn Italiano 3:45 PM

## 2013-08-13 ENCOUNTER — Other Ambulatory Visit: Payer: Self-pay

## 2013-08-13 LAB — BASIC METABOLIC PANEL WITH GFR
BUN: 20 mg/dL (ref 6–23)
CO2: 26 mEq/L (ref 19–32)
Calcium: 8.9 mg/dL (ref 8.4–10.5)
Chloride: 104 mEq/L (ref 96–112)
Creat: 1.07 mg/dL (ref 0.50–1.35)
GFR, EST AFRICAN AMERICAN: 88 mL/min
GFR, EST NON AFRICAN AMERICAN: 76 mL/min
Glucose, Bld: 80 mg/dL (ref 70–99)
Potassium: 4 mEq/L (ref 3.5–5.3)
SODIUM: 137 meq/L (ref 135–145)

## 2013-08-13 LAB — LIPID PANEL
CHOL/HDL RATIO: 5.6 ratio
Cholesterol: 203 mg/dL — ABNORMAL HIGH (ref 0–200)
HDL: 36 mg/dL — ABNORMAL LOW (ref >39)
TRIGLYCERIDES: 427 mg/dL — AB (ref <150)

## 2013-08-13 LAB — HEPATIC FUNCTION PANEL
ALK PHOS: 84 U/L (ref 39–117)
ALT: 28 U/L (ref 0–53)
AST: 20 U/L (ref 0–37)
Albumin: 4.1 g/dL (ref 3.5–5.2)
BILIRUBIN DIRECT: 0.1 mg/dL (ref 0.0–0.3)
BILIRUBIN TOTAL: 0.4 mg/dL (ref 0.2–1.2)
Indirect Bilirubin: 0.3 mg/dL (ref 0.2–1.2)
TOTAL PROTEIN: 7 g/dL (ref 6.0–8.3)

## 2013-08-13 LAB — MAGNESIUM: Magnesium: 1.9 mg/dL (ref 1.5–2.5)

## 2013-08-13 LAB — HEMOGLOBIN A1C
Hgb A1c MFr Bld: 7.1 % — ABNORMAL HIGH (ref <5.7)
Mean Plasma Glucose: 157 mg/dL — ABNORMAL HIGH (ref <117)

## 2013-08-13 LAB — TSH: TSH: 2.013 u[IU]/mL (ref 0.350–4.500)

## 2013-08-13 LAB — VITAMIN D 25 HYDROXY (VIT D DEFICIENCY, FRACTURES): VIT D 25 HYDROXY: 32 ng/mL (ref 30–89)

## 2013-08-13 LAB — TESTOSTERONE: Testosterone: 227 ng/dL — ABNORMAL LOW (ref 300–890)

## 2013-08-13 MED ORDER — METFORMIN HCL ER 500 MG PO TB24: ORAL_TABLET | ORAL | Status: DC

## 2013-08-13 MED ORDER — ATORVASTATIN CALCIUM 40 MG PO TABS: 40.0000 mg | ORAL_TABLET | Freq: Every day | ORAL | Status: DC

## 2013-08-29 ENCOUNTER — Other Ambulatory Visit: Payer: Self-pay | Admitting: Internal Medicine

## 2013-10-16 ENCOUNTER — Ambulatory Visit (INDEPENDENT_AMBULATORY_CARE_PROVIDER_SITE_OTHER): Payer: BC Managed Care – PPO

## 2013-10-16 DIAGNOSIS — E291 Testicular hypofunction: Secondary | ICD-10-CM

## 2013-10-16 MED ORDER — TESTOSTERONE CYPIONATE 200 MG/ML IM SOLN
400.0000 mg | Freq: Once | INTRAMUSCULAR | Status: AC
  Administered 2013-10-16: 400 mg via INTRAMUSCULAR

## 2013-10-16 NOTE — Progress Notes (Signed)
Patient ID: Mark [name], male   DOB: September 12, 1955, 58 y.o.   MRN: [id number] Patient here today for testosterone injection.Received 2.0 ml IM right glut. Patient tolerated well.

## 2013-10-30 ENCOUNTER — Ambulatory Visit: Payer: Self-pay

## 2013-11-12 ENCOUNTER — Ambulatory Visit: Payer: Self-pay | Admitting: Physician Assistant

## 2013-11-20 ENCOUNTER — Ambulatory Visit (INDEPENDENT_AMBULATORY_CARE_PROVIDER_SITE_OTHER): Payer: BC Managed Care – PPO | Admitting: *Deleted

## 2013-11-20 DIAGNOSIS — E291 Testicular hypofunction: Secondary | ICD-10-CM

## 2013-11-20 MED ORDER — TESTOSTERONE CYPIONATE 200 MG/ML IM SOLN
400.0000 mg | Freq: Once | INTRAMUSCULAR | Status: AC
  Administered 2013-11-20: 400 mg via INTRAMUSCULAR

## 2013-11-20 NOTE — Progress Notes (Signed)
Patient ID: Mark [name], male   DOB: [birth year], 58 y.o.   MRN: [id number] Patient presents for testosterone injection for hypogonadism Tx.  Patient received 2 cc IM left glut.  Patient tolerated well.

## 2014-01-19 ENCOUNTER — Other Ambulatory Visit: Payer: Self-pay | Admitting: Internal Medicine

## 2014-01-21 ENCOUNTER — Ambulatory Visit (INDEPENDENT_AMBULATORY_CARE_PROVIDER_SITE_OTHER): Payer: BC Managed Care – PPO | Admitting: *Deleted

## 2014-01-21 DIAGNOSIS — E291 Testicular hypofunction: Secondary | ICD-10-CM

## 2014-01-21 MED ORDER — TESTOSTERONE CYPIONATE 200 MG/ML IM SOLN
400.0000 mg | Freq: Once | INTRAMUSCULAR | Status: AC
  Administered 2014-01-21: 400 mg via INTRAMUSCULAR

## 2014-01-21 NOTE — Progress Notes (Signed)
Patient ID: Mark [name], male   DOB: 1955-12-28, 58 y.o.   MRN: [id number] Patient presents for testosterone injection for hypogonadism treatment.  Patient received 2 cc IM right glut and tolerated well.  Will f/u for next injection AD.

## 2014-02-17 ENCOUNTER — Encounter: Payer: Self-pay | Admitting: Internal Medicine

## 2014-03-31 ENCOUNTER — Ambulatory Visit (INDEPENDENT_AMBULATORY_CARE_PROVIDER_SITE_OTHER): Payer: BC Managed Care – PPO | Admitting: *Deleted

## 2014-03-31 DIAGNOSIS — E291 Testicular hypofunction: Secondary | ICD-10-CM

## 2014-03-31 MED ORDER — TESTOSTERONE CYPIONATE 200 MG/ML IM SOLN
400.0000 mg | Freq: Once | INTRAMUSCULAR | Status: AC
  Administered 2014-03-31: 400 mg via INTRAMUSCULAR

## 2014-03-31 NOTE — Progress Notes (Signed)
Patient ID: Mark BeagleMichael J Woodfin, male   DOB: 1955-10-29, 59 y.o.   MRN: [id number] Patient presents for testosterone injection for hypogonadism Tx.  Patient received 2 cc IM left glut.  Patient tolerated well.

## 2014-04-05 ENCOUNTER — Other Ambulatory Visit: Payer: Self-pay | Admitting: Physician Assistant

## 2014-04-23 ENCOUNTER — Encounter: Payer: Self-pay | Admitting: Physician Assistant

## 2014-05-12 ENCOUNTER — Other Ambulatory Visit: Payer: Self-pay | Admitting: Physician Assistant

## 2014-05-12 NOTE — Telephone Encounter (Signed)
No refill without OV 

## 2014-05-13 ENCOUNTER — Ambulatory Visit (INDEPENDENT_AMBULATORY_CARE_PROVIDER_SITE_OTHER): Payer: BC Managed Care – PPO | Admitting: *Deleted

## 2014-05-13 DIAGNOSIS — E291 Testicular hypofunction: Secondary | ICD-10-CM

## 2014-05-13 MED ORDER — TESTOSTERONE CYPIONATE 200 MG/ML IM SOLN
400.0000 mg | Freq: Once | INTRAMUSCULAR | Status: AC
  Administered 2014-05-13: 400 mg via INTRAMUSCULAR

## 2014-05-13 NOTE — Progress Notes (Signed)
Patient ID: Mark BeagleMichael J [name], male   DOB: [birth year], 59 y.o.   MRN: [id number] Patient presents for testosterone injection for hypogonadism treatment.  Patient received 2 cc IM right glut.  Patient tolerated well.

## 2014-05-19 ENCOUNTER — Telehealth: Payer: Self-pay | Admitting: *Deleted

## 2014-05-19 MED ORDER — MECLIZINE HCL 25 MG PO TABS: 25.0000 mg | ORAL_TABLET | Freq: Three times a day (TID) | ORAL | Status: DC | PRN

## 2014-05-19 MED ORDER — PREDNISONE 10 MG PO TABS
ORAL_TABLET | ORAL | Status: DC
Start: 2014-05-19 — End: 2014-07-27

## 2014-05-19 NOTE — Telephone Encounter (Signed)
Patient called and states he is having dizzy spells x 3 days.  RX sent in for Meclizine and Prednisone per Dr Oneta RackMcKeown.

## 2014-06-23 ENCOUNTER — Encounter: Payer: Self-pay | Admitting: Physician Assistant

## 2014-07-06 ENCOUNTER — Ambulatory Visit (INDEPENDENT_AMBULATORY_CARE_PROVIDER_SITE_OTHER): Payer: BC Managed Care – PPO

## 2014-07-06 DIAGNOSIS — E291 Testicular hypofunction: Secondary | ICD-10-CM

## 2014-07-06 MED ORDER — TESTOSTERONE CYPIONATE 200 MG/ML IM SOLN
400.0000 mg | Freq: Once | INTRAMUSCULAR | Status: AC
  Administered 2014-07-06: 400 mg via INTRAMUSCULAR

## 2014-07-06 NOTE — Progress Notes (Signed)
Patient ID: Mark [name], male   DOB: 02-08-1956, 59 y.o.   MRN: [id number] Patient here today for testosterone injection. Patient received 2.0 mL IM left glut. Patient tolerated well.

## 2014-07-27 ENCOUNTER — Ambulatory Visit (INDEPENDENT_AMBULATORY_CARE_PROVIDER_SITE_OTHER): Payer: BC Managed Care – PPO | Admitting: Physician Assistant

## 2014-07-27 ENCOUNTER — Encounter: Payer: Self-pay | Admitting: Physician Assistant

## 2014-07-27 VITALS — BP 138/78 | HR 80 | Temp 98.1°F | Resp 16 | Ht 72.5 in | Wt 239.0 lb

## 2014-07-27 DIAGNOSIS — Z125 Encounter for screening for malignant neoplasm of prostate: Secondary | ICD-10-CM

## 2014-07-27 DIAGNOSIS — Z0001 Encounter for general adult medical examination with abnormal findings: Secondary | ICD-10-CM

## 2014-07-27 DIAGNOSIS — Z79899 Other long term (current) drug therapy: Secondary | ICD-10-CM | POA: Insufficient documentation

## 2014-07-27 DIAGNOSIS — R3911 Hesitancy of micturition: Secondary | ICD-10-CM

## 2014-07-27 DIAGNOSIS — R6889 Other general symptoms and signs: Secondary | ICD-10-CM

## 2014-07-27 DIAGNOSIS — E66811 Obesity, class 1: Secondary | ICD-10-CM | POA: Insufficient documentation

## 2014-07-27 DIAGNOSIS — E291 Testicular hypofunction: Secondary | ICD-10-CM

## 2014-07-27 DIAGNOSIS — E559 Vitamin D deficiency, unspecified: Secondary | ICD-10-CM

## 2014-07-27 DIAGNOSIS — E669 Obesity, unspecified: Secondary | ICD-10-CM

## 2014-07-27 DIAGNOSIS — K219 Gastro-esophageal reflux disease without esophagitis: Secondary | ICD-10-CM

## 2014-07-27 DIAGNOSIS — N182 Chronic kidney disease, stage 2 (mild): Secondary | ICD-10-CM

## 2014-07-27 DIAGNOSIS — E1122 Type 2 diabetes mellitus with diabetic chronic kidney disease: Secondary | ICD-10-CM

## 2014-07-27 DIAGNOSIS — E785 Hyperlipidemia, unspecified: Secondary | ICD-10-CM

## 2014-07-27 DIAGNOSIS — I10 Essential (primary) hypertension: Secondary | ICD-10-CM

## 2014-07-27 LAB — CBC WITH DIFFERENTIAL/PLATELET
BASOS ABS: 0 10*3/uL (ref 0.0–0.1)
Basophils Relative: 0 % (ref 0–1)
EOS ABS: 0.2 10*3/uL (ref 0.0–0.7)
EOS PCT: 2 % (ref 0–5)
HEMATOCRIT: 43 % (ref 39.0–52.0)
HEMOGLOBIN: 14.2 g/dL (ref 13.0–17.0)
LYMPHS ABS: 2.1 10*3/uL (ref 0.7–4.0)
Lymphocytes Relative: 24 % (ref 12–46)
MCH: 29 pg (ref 26.0–34.0)
MCHC: 33 g/dL (ref 30.0–36.0)
MCV: 87.8 fL (ref 78.0–100.0)
MONO ABS: 0.5 10*3/uL (ref 0.1–1.0)
MONOS PCT: 6 % (ref 3–12)
MPV: 11 fL (ref 8.6–12.4)
Neutro Abs: 5.8 10*3/uL (ref 1.7–7.7)
Neutrophils Relative %: 68 % (ref 43–77)
Platelets: 253 10*3/uL (ref 150–400)
RBC: 4.9 MIL/uL (ref 4.22–5.81)
RDW: 13 % (ref 11.5–15.5)
WBC: 8.6 10*3/uL (ref 4.0–10.5)

## 2014-07-27 LAB — HEMOGLOBIN A1C
HEMOGLOBIN A1C: 7.9 % — AB (ref <5.7)
MEAN PLASMA GLUCOSE: 180 mg/dL — AB (ref <117)

## 2014-07-27 MED ORDER — TESTOSTERONE CYPIONATE 200 MG/ML IM SOLN
400.0000 mg | Freq: Once | INTRAMUSCULAR | Status: AC
  Administered 2014-07-27: 400 mg via INTRAMUSCULAR

## 2014-07-27 MED ORDER — SULFAMETHOXAZOLE-TRIMETHOPRIM 800-160 MG PO TABS: 1.0000 | ORAL_TABLET | Freq: Two times a day (BID) | ORAL | Status: DC

## 2014-07-27 MED ORDER — TESTOSTERONE 50 MG/5GM (1%) TD GEL: 5.0000 g | Freq: Every day | TRANSDERMAL | Status: DC

## 2014-07-27 NOTE — Progress Notes (Signed)
Complete Physical  Assessment and Plan: 1. Male hypogonadism He would like to try to switch to testim, printed out RX for him - testosterone cypionate (DEPOTESTOTERONE CYPIONATE) injection 400 mg; Inject 2 mLs (400 mg total) into the muscle once. - Testosterone  2. Essential hypertension - continue medications, DASH diet, exercise and monitor at home. Call if greater than 130/80.  - CBC with Differential/Platelet - Hepatic function panel - TSH - Urinalysis, Routine w reflex microscopic - Microalbumin / creatinine urine ratio - EKG 12-Lead  3. Hyperlipidemia -continue medications, check lipids, decrease fatty foods, increase activity.  Not at goal, goal is LDL less than 70 - Lipid panel  4. CKD stage 2 due to type 2 diabetes mellitus Discussed general issues about diabetes pathophysiology and management., Educational material distributed., Suggested low cholesterol diet., Encouraged aerobic exercise., Discussed foot care., Reminded to get yearly retinal exam. - BASIC METABOLIC PANEL WITH GFR - Hemoglobin A1c - Insulin, fasting - HM DIABETES FOOT EXAM  5. Obesity Obesity with co morbidities- long discussion about weight loss, diet, and exercise  6. Gastroesophageal reflux disease without esophagitis Continue PPI/H2 blocker, diet discussed  7. Vitamin D deficiency - Vit D  25 hydroxy (rtn osteoporosis monitoring)  8. Medication management - Magnesium  9. Prostate cancer screening - PSA  10. Urinary hesitancy ? Prostate infection will put on Bactrim BID for 1 month - Urine culture  Discussed med's effects and SE's. Screening labs and tests as requested with regular follow-up as recommended. Moderate- high complexity decision making was done during the course of this visit.   HPI Patient presents for complete physical.   Patient's blood pressure has been controlled at home. Patient denies chest pain, shortness of breath, dizziness. BP: 138/78 mmHg  Patient's  cholesterol is diet controlled. They are on lipitor and denies myalgias. The patient's cholesterol last visit was: Lab Results  Component Value Date   CHOL 203* 08/12/2013   HDL 36* 08/12/2013   LDLCALC NOT CALC 08/12/2013   TRIG 427* 08/12/2013   CHOLHDL 5.6 08/12/2013  CPK was 244.  He has a history of testosterone deficiency and is on testosterone replacement. He states that the testosterone helps with his energy, libido, muscle mass. Lab Results  Component Value Date   TESTOSTERONE 227* 08/12/2013  He is not on supplement. Last Vitamin D was Lab Results  Component Value Date   VD25OH 9 08/12/2013   He has been working on diet and exercise for Diabetes with diabetic chronic kidney disease stage 2 (GFR 76 and 83), his A1C has been in the DM range for the last year, he is on metformin he is on bASA, he is not on ACE/ARB, and denies paresthesia of the feet, polydipsia, polyuria and visual disturbances. Last A1C was:  Lab Results  Component Value Date   HGBA1C 7.1* 08/12/2013   BMI is Body mass index is 31.95 kg/(m^2)., he is working on diet and exercise. Wt Readings from Last 3 Encounters:  07/27/14 239 lb (108.41 kg)  08/12/13 230 lb (104.327 kg)  06/27/13 233 lb (105.688 kg)   He has had cold symptoms for 1 week but has also been having prostate symptoms, he has weak stream, abnormal odor, and states this is like when he has a prostate infection in the past and he has had fever, chills.   Current Medications:  Current Outpatient Prescriptions on File Prior to Visit  Medication Sig Dispense Refill  . aspirin 81 MG chewable tablet Chew by mouth daily.    Marland Kitchen  atorvastatin (LIPITOR) 40 MG tablet TAKE ONE TABLET BY MOUTH ONCE DAILY 30 tablet 0  . bisoprolol-hydrochlorothiazide (ZIAC) 5-6.25 MG per tablet Take 1 tablet by mouth daily.    . fenofibrate micronized (LOFIBRA) 134 MG capsule Take 1 capsule (134 mg total) by mouth daily before breakfast. 90 capsule 0  . meclizine  (ANTIVERT) 25 MG tablet Take 1 tablet (25 mg total) by mouth 3 (three) times daily as needed for dizziness. 50 tablet 0  . metFORMIN (GLUCOPHAGE XR) 500 MG 24 hr tablet Take 2 tablets twice daily 120 tablet 1  . predniSONE (DELTASONE) 10 MG tablet Take 1 tab tid x 5 days 15 tablet 0  . ranitidine (ZANTAC) 300 MG tablet TAKE ONE TABLET BY MOUTH ONCE DAILY FOR  ACID  REFLUX 90 tablet 0  . testosterone cypionate (DEPOTESTOTERONE CYPIONATE) 200 MG/ML injection inject 2 milliliter intramuscularly every 2 weeks 10 mL 0   No current facility-administered medications on file prior to visit.   Health Maintenance:  Tetanus: 2007 Pneumovax: 2013 Prevnar 13: due age 59 Flu vaccine: N/A Zostavax:N/A DEXA: N/A Colonoscopy: 10/2009 due 10 years EGD:N/A  Allergies:  Allergies  Allergen Reactions  . Codeine     REACTION: Anxiety  . Sertraline Swelling   Medical History:  Past Medical History  Diagnosis Date  . Hyperlipidemia   . Hypertension   . Prediabetes   . GERD (gastroesophageal reflux disease)   . Vitamin D deficiency   . Other testicular hypofunction    Surgical History:  Past Surgical History  Procedure Laterality Date  . Vasectomy  01/2004  . Urethral dilation  2007   Family History:  Family History  Problem Relation Age of Onset  . Hypertension Mother   . Hyperlipidemia Mother   . COPD Father   . Heart disease Father   . Hypertension Father    Social History:  History   Social History  . Marital Status: Married    Spouse Name: N/A  . Number of Children: N/A  . Years of Education: N/A   Occupational History  . Not on file.   Social History Main Topics  . Smoking status: Never Smoker   . Smokeless tobacco: Not on file  . Alcohol Use: No  . Drug Use: No  . Sexual Activity: Not on file   Other Topics Concern  . Not on file   Social History Narrative   Review of Systems  Constitutional: Negative.   HENT: Positive for congestion and sore throat. Negative  for ear discharge, ear pain, hearing loss, nosebleeds and tinnitus.   Eyes: Positive for blurred vision. Negative for double vision, photophobia, pain, discharge and redness.  Respiratory: Negative.  Negative for stridor.   Cardiovascular: Negative.  Negative for chest pain.  Gastrointestinal: Negative.   Genitourinary: Positive for dysuria. Negative for urgency, frequency, hematuria and flank pain.  Musculoskeletal: Negative.   Skin: Negative.   Neurological: Negative.  Negative for headaches.  Psychiatric/Behavioral: Negative.  Negative for depression. The patient is not nervous/anxious.    Physical Exam: Estimated body mass index is 31.95 kg/(m^2) as calculated from the following:   Height as of this encounter: 6' 0.5" (1.842 m).   Weight as of this encounter: 239 lb (108.41 kg). Filed Vitals:   07/27/14 1344  BP: 138/78  Pulse: 80  Temp: 98.1 F (36.7 C)  Resp: 16   General Appearance: Well nourished, in no apparent distress. Eyes: PERRLA, EOMs, conjunctiva no swelling or erythema, normal fundi and vessels. Sinuses: No  Frontal/maxillary tenderness ENT/Mouth: Ext aud canals clear, normal light reflex with TMs without erythema, bulging. Good dentition. No erythema, swelling, or exudate on post pharynx. Tonsils not swollen or erythematous. Hearing normal.  Neck: Supple, thyroid normal. No bruits Respiratory: Respiratory effort normal, BS equal bilaterally without rales, rhonchi, wheezing or stridor. Cardio: RRR without murmurs, rubs or gallops. Brisk peripheral pulses without edema.  Chest: symmetric, with normal excursions and percussion. Abdomen: Soft, +BS. Non tender, no guarding, rebound, hernias, masses, or organomegaly. .  Lymphatics: Non tender without lymphadenopathy.  Genitourinary: defer Musculoskeletal: Full ROM all peripheral extremities,5/5 strength, and normal gait. Skin: Warm, dry without rashes, lesions, ecchymosis.  Neuro: Cranial nerves intact, reflexes equal  bilaterally. Normal muscle tone, no cerebellar symptoms. Sensation intact.  Psych: Awake and oriented X 3, normal affect, Insight and Judgment appropriate.   EKG: WNL no changes.   Quentin Mullingollier, Evert Wenrich 2:07 PM

## 2014-07-27 NOTE — Patient Instructions (Addendum)
Diabetes is a very complicated disease...lets simplify it.  An easy way to look at it to understand the complications is if you think of the extra sugar floating in your blood stream as glass shards floating through your blood stream.   Diabetes affects your small vessels first: 1) The glass shards (sugar) scraps down the tiny blood vessels in your eyes and lead to diabetic retinopathy, the leading cause of blindness in the [place]. Diabetes is the leading cause of newly diagnosed adult (4820 to 60 years of age) blindness in the [place].  2) The glass shards scratches down the tiny vessels of your legs leading to nerve damage called neuropathy and can lead to amputations of your feet. More than 60% of all non-traumatic amputations of lower limbs occur in people with diabetes.  3) Over time the small vessels in your brain are shredded and closed off, individually this does not cause any problems but over a long period of time many of the small vessels being blocked can lead to Vascular Dementia.   4) Your kidney's are a filter system and have a "net" that keeps certain things in the body and lets bad things out. Sugar shreds this net and leads to kidney damage and eventually failure. Decreasing the sugar that is destroying the net and certain blood pressure medications can help stop or decrease progression of kidney disease. Diabetes was the primary cause of kidney failure in 44 percent of all new cases in 2011.  5) Diabetes also destroys the small vessels in your penis that lead to erectile dysfunction. Eventually the vessels are so damaged that you may not be responsive to cialis or viagra.   Diabetes and your large vessels: Your larger vessels consist of your coronary arteries in your heart and the carotid vessels to your brain. Diabetes or even increased sugars put you at 300% increased risk of heart attack and stroke and this is why.. The sugar scrapes down your large blood vessels and your body  sees this as an internal injury and tries to repair itself. Just like you get a scab on your skin, your platelets will stick to the blood vessel wall trying to heal it. This is why we have diabetics on low dose aspirin daily, this prevents the platelets from sticking and can prevent plaque formation. In addition, your body takes cholesterol and tries to shove it into the open wound. This is why we want your LDL, or bad cholesterol, below 70.   The combination of platelets and cholesterol over 5-10 years forms plaque that can break off and cause a heart attack or stroke.   PLEASE REMEMBER:  Diabetes is preventable! Up to 85 percent of complications and morbidities among individuals with type 2 diabetes can be prevented, delayed, or effectively treated and minimized with regular visits to a health professional, appropriate monitoring and medication, and a healthy diet and lifestyle.     Bad carbs also include fruit juice, alcohol, and sweet tea. These are empty calories that do not signal to your brain that you are full.   Please remember the good carbs are still carbs which convert into sugar. So please measure them out no more than 1/2-1 cup of rice, oatmeal, pasta, and beans  Veggies are however free foods! Pile them on.   Not all fruit is created equal. Please see the list below, the fruit at the bottom is higher in sugars than the fruit at the top. Please avoid all dried fruits.  9 Ways to Naturally Increase Testosterone Levels  1.   Lose Weight If you're overweight, shedding the excess pounds may increase your testosterone levels, according to research presented at the Endocrine Society's 2012 meeting. Overweight men are more likely to have low testosterone levels to begin with, so this is an important trick to increase your body's testosterone production when you need it most.  2.   High-Intensity Exercise like Peak Fitness  Short intense exercise has a proven positive effect on  increasing testosterone levels and preventing its decline. That's unlike aerobics or prolonged moderate exercise, which have shown to have negative or no effect on testosterone levels. Having a whey protein meal after exercise can further enhance the satiety/testosterone-boosting impact (hunger hormones cause the opposite effect on your testosterone and libido). Here's a summary of what a typical high-intensity Peak Fitness routine might look like: " Warm up for three minutes  " Exercise as hard and fast as you can for 30 seconds. You should feel like you couldn't possibly go on another few seconds  " Recover at a slow to moderate pace for 90 seconds  " Repeat the high intensity exercise and recovery 7 more times .  3.   Consume Plenty of Zinc The mineral zinc is important for testosterone production, and supplementing your diet for as little as six weeks has been shown to cause a marked improvement in testosterone among men with low levels.1 Likewise, research has shown that restricting dietary sources of zinc leads to a significant decrease in testosterone, while zinc supplementation increases it2 -- and even protects men from exercised-induced reductions in testosterone levels.3 It's estimated that up to 61 percent of adults over the age of 60 may have lower than recommended zinc intakes; even when dietary supplements were added in, an estimated 20-25 percent of older adults still had inadequate zinc intakes, according to a [employer] and Nutrition Examination Survey.4 Your diet is the best source of zinc; along with protein-rich foods like meats and fish, other good dietary sources of zinc include raw milk, raw cheese, beans, and yogurt or kefir made from raw milk. It can be difficult to obtain enough dietary zinc if you're a vegetarian, and also for meat-eaters as well, largely because of conventional farming methods that rely heavily on chemical fertilizers and pesticides. These chemicals  deplete the soil of nutrients ... nutrients like zinc that must be absorbed by plants in order to be passed on to you. In many cases, you may further deplete the nutrients in your food by the way you prepare it. For most food, cooking it will drastically reduce its levels of nutrients like zinc . particularly over-cooking, which many people do. If you decide to use a zinc supplement, stick to a dosage of less than 40 mg a day, as this is the recommended adult upper limit. Taking too much zinc can interfere with your body's ability to absorb other minerals, especially copper, and may cause nausea as a side effect.  4.   Strength Training In addition to Peak Fitness, strength training is also known to boost testosterone levels, provided you are doing so intensely enough. When strength training to boost testosterone, you'll want to increase the weight and lower your number of reps, and then focus on exercises that work a large number of muscles, such as dead lifts or squats.  You can "turbo-charge" your weight training by going slower. By slowing down your movement, you're actually turning it into a high-intensity exercise. Super  Slow movement allows your muscle, at the microscopic level, to access the maximum number of cross-bridges between the protein filaments that produce movement in the muscle.   5.   Optimize Your Vitamin D Levels Vitamin D, a steroid hormone, is essential for the healthy development of the nucleus of the sperm cell, and helps maintain semen quality and sperm count. Vitamin D also increases levels of testosterone, which may boost libido. In one study, overweight men who were given vitamin D supplements had a significant increase in testosterone levels after one year.5   6.   Reduce Stress When you're under a lot of stress, your body releases high levels of the stress hormone cortisol. This hormone actually blocks the effects of testosterone,6 presumably because, from a biological  standpoint, testosterone-associated behaviors (mating, competing, aggression) may have lowered your chances of survival in an emergency (hence, the "fight or flight" response is dominant, courtesy of cortisol).  7.   Limit or Eliminate Sugar from Your Diet Testosterone levels decrease after you eat sugar, which is likely because the sugar leads to a high insulin level, another factor leading to low testosterone.7 Based on USDA estimates, the average American consumes 12 teaspoons of sugar a day, which equates to about TWO TONS of sugar during a lifetime.  8.   Eat Healthy Fats By healthy, this means not only mon- and polyunsaturated fats, like that found in avocadoes and nuts, but also saturated, as these are essential for building testosterone. Research shows that a diet with less than 40 percent of energy as fat (and that mainly from animal sources, i.e. saturated) lead to a decrease in testosterone levels.8 My personal diet is about 60-70 percent healthy fat, and other experts agree that the ideal diet includes somewhere between 50-70 percent fat.  It's important to understand that your body requires saturated fats from animal and vegetable sources (such as meat, dairy, certain oils, and tropical plants like coconut) for optimal functioning, and if you neglect this important food group in favor of sugar, grains and other starchy carbs, your health and weight are almost guaranteed to suffer. Examples of healthy fats you can eat more of to give your testosterone levels a boost include: Olives and Olive oil  Coconuts and coconut oil Butter made from raw grass-fed organic milk Raw nuts, such as, almonds or pecans Organic pastured egg yolks Avocados Grass-fed meats Palm oil Unheated organic nut oils   9.   Boost Your Intake of Branch Chain Amino Acids (BCAA) from Foods Like Whey Protein Research suggests that BCAAs result in higher testosterone levels, particularly when taken along with resistance  training.9 While BCAAs are available in supplement form, you'll find the highest concentrations of BCAAs like leucine in dairy products - especially quality cheeses and whey protein. Even when getting leucine from your natural food supply, it's often wasted or used as a building block instead of an anabolic agent. So to create the correct anabolic environment, you need to boost leucine consumption way beyond mere maintenance levels. That said, keep in mind that using leucine as a free form amino acid can be highly counterproductive as when free form amino acids are artificially administrated, they rapidly enter your circulation while disrupting insulin function, and impairing your body's glycemic control. Food-based leucine is really the ideal form that can benefit your muscles without side effects.   Can do spinach, strawberries, whey protein( try to get low sugar), avocado oil, almond mild can add ice if want more like a  smoothie.   I think it is possible that you have sleep apnea. It can cause interrupted sleep, headaches, frequent awakenings, fatigue, dry mouth, fast/slow heart beats, memory issues, anxiety/depression, swelling, numbness tingling hands/feet, weight gain, shortness of breath, and the list goes on. Sleep apnea needs to be ruled out because if it is left untreated it does eventually lead to abnormal heart beats, lung failure or heart failure as well as increasing the risk of heart attack and stroke. There are masks you can wear OR a mouth piece that I can give you information about. Often times though people feel MUCH better after getting treatment.   Sleep Apnea  Sleep apnea is a sleep disorder characterized by abnormal pauses in breathing while you sleep. When your breathing pauses, the level of oxygen in your blood decreases. This causes you to move out of deep sleep and into light sleep. As a result, your quality of sleep is poor, and the system that carries your blood throughout your  body (cardiovascular system) experiences stress. If sleep apnea remains untreated, the following conditions can develop:  High blood pressure (hypertension).  Coronary artery disease.  Inability to achieve or maintain an erection (impotence).  Impairment of your thought process (cognitive dysfunction). There are three types of sleep apnea: 1. Obstructive sleep apnea--Pauses in breathing during sleep because of a blocked airway. 2. Central sleep apnea--Pauses in breathing during sleep because the area of the brain that controls your breathing does not send the correct signals to the muscles that control breathing. 3. Mixed sleep apnea--A combination of both obstructive and central sleep apnea.  RISK FACTORS The following risk factors can increase your risk of developing sleep apnea:  Being overweight.  Smoking.  Having narrow passages in your nose and throat.  Being of older age.  Being male.  Alcohol use.  Sedative and tranquilizer use.  Ethnicity. Among individuals younger than 35 years, African Americans are at increased risk of sleep apnea. SYMPTOMS   Difficulty staying asleep.  Daytime sleepiness and fatigue.  Loss of energy.  Irritability.  Loud, heavy snoring.  Morning headaches.  Trouble concentrating.  Forgetfulness.  Decreased interest in sex. DIAGNOSIS  In order to diagnose sleep apnea, your caregiver will perform a physical examination. Your caregiver may suggest that you take a home sleep test. Your caregiver may also recommend that you spend the night in a sleep lab. In the sleep lab, several monitors record information about your heart, lungs, and brain while you sleep. Your leg and arm movements and blood oxygen level are also recorded. TREATMENT The following actions may help to resolve mild sleep apnea:  Sleeping on your side.   Using a decongestant if you have nasal congestion.   Avoiding the use of depressants, including alcohol,  sedatives, and narcotics.   Losing weight and modifying your diet if you are overweight. There also are devices and treatments to help open your airway:  Oral appliances. These are custom-made mouthpieces that shift your lower jaw forward and slightly open your bite. This opens your airway.  Devices that create positive airway pressure. This positive pressure "splints" your airway open to help you breathe better during sleep. The following devices create positive airway pressure:  Continuous positive airway pressure (CPAP) device. The CPAP device creates a continuous level of air pressure with an air pump. The air is delivered to your airway through a mask while you sleep. This continuous pressure keeps your airway open.  Nasal expiratory positive airway pressure (EPAP)  device. The EPAP device creates positive air pressure as you exhale. The device consists of single-use valves, which are inserted into each nostril and held in place by adhesive. The valves create very little resistance when you inhale but create much more resistance when you exhale. That increased resistance creates the positive airway pressure. This positive pressure while you exhale keeps your airway open, making it easier to breath when you inhale again.  Bilevel positive airway pressure (BPAP) device. The BPAP device is used mainly in patients with central sleep apnea. This device is similar to the CPAP device because it also uses an air pump to deliver continuous air pressure through a mask. However, with the BPAP machine, the pressure is set at two different levels. The pressure when you exhale is lower than the pressure when you inhale.  Surgery. Typically, surgery is only done if you cannot comply with less invasive treatments or if the less invasive treatments do not improve your condition. Surgery involves removing excess tissue in your airway to create a wider passage way. Document Released: 03/03/2002 Document Revised:  07/08/2012 Document Reviewed: 07/20/2011 [facility] Patient Information 2015 [place], [place]. This information is not intended to replace advice given to you by your health care provider. Make sure you discuss any questions you have with your health care provider.

## 2014-07-28 ENCOUNTER — Encounter: Payer: Self-pay | Admitting: Physician Assistant

## 2014-07-28 LAB — LIPID PANEL
CHOLESTEROL: 171 mg/dL (ref 0–200)
HDL: 30 mg/dL — AB (ref >=40)
LDL CALC: 75 mg/dL (ref 0–99)
TRIGLYCERIDES: 332 mg/dL — AB (ref <150)
Total CHOL/HDL Ratio: 5.7 Ratio
VLDL: 66 mg/dL — AB (ref 0–40)

## 2014-07-28 LAB — URINE CULTURE: Colony Count: 60000

## 2014-07-28 LAB — BASIC METABOLIC PANEL WITH GFR
BUN: 16 mg/dL (ref 6–23)
CHLORIDE: 101 meq/L (ref 96–112)
CO2: 26 mEq/L (ref 19–32)
Calcium: 9.1 mg/dL (ref 8.4–10.5)
Creat: 1.14 mg/dL (ref 0.50–1.35)
GFR, Est African American: 81 mL/min
GFR, Est Non African American: 70 mL/min
Glucose, Bld: 218 mg/dL — ABNORMAL HIGH (ref 70–99)
Potassium: 4.2 mEq/L (ref 3.5–5.3)
Sodium: 137 mEq/L (ref 135–145)

## 2014-07-28 LAB — URINALYSIS, MICROSCOPIC ONLY
Casts: NONE SEEN
Crystals: NONE SEEN
SQUAMOUS EPITHELIAL / LPF: NONE SEEN

## 2014-07-28 LAB — URINALYSIS, ROUTINE W REFLEX MICROSCOPIC
Bilirubin Urine: NEGATIVE
GLUCOSE, UA: 500 mg/dL — AB
HGB URINE DIPSTICK: NEGATIVE
Ketones, ur: NEGATIVE mg/dL
LEUKOCYTES UA: NEGATIVE
NITRITE: NEGATIVE
PH: 6.5 (ref 5.0–8.0)
Protein, ur: 30 mg/dL — AB
SPECIFIC GRAVITY, URINE: 1.028 (ref 1.005–1.030)
UROBILINOGEN UA: 0.2 mg/dL (ref 0.0–1.0)

## 2014-07-28 LAB — HEPATIC FUNCTION PANEL
ALBUMIN: 4.2 g/dL (ref 3.5–5.2)
ALT: 23 U/L (ref 0–53)
AST: 17 U/L (ref 0–37)
Alkaline Phosphatase: 98 U/L (ref 39–117)
Bilirubin, Direct: 0.1 mg/dL (ref 0.0–0.3)
Indirect Bilirubin: 0.2 mg/dL (ref 0.2–1.2)
Total Bilirubin: 0.3 mg/dL (ref 0.2–1.2)
Total Protein: 7 g/dL (ref 6.0–8.3)

## 2014-07-28 LAB — MICROALBUMIN / CREATININE URINE RATIO
Creatinine, Urine: 128.5 mg/dL
Microalb Creat Ratio: 3.1 mg/g (ref 0.0–30.0)
Microalb, Ur: 0.4 mg/dL (ref <2.0)

## 2014-07-28 LAB — PSA: PSA: 1.77 ng/mL (ref <=4.00)

## 2014-07-28 LAB — MAGNESIUM: Magnesium: 2.1 mg/dL (ref 1.5–2.5)

## 2014-07-28 LAB — INSULIN, FASTING: INSULIN FASTING, SERUM: 31.2 u[IU]/mL — AB (ref 2.0–19.6)

## 2014-07-28 LAB — TESTOSTERONE: Testosterone: 347 ng/dL (ref 300–890)

## 2014-07-28 LAB — TSH: TSH: 1.469 u[IU]/mL (ref 0.350–4.500)

## 2014-07-28 LAB — VITAMIN D 25 HYDROXY (VIT D DEFICIENCY, FRACTURES): VIT D 25 HYDROXY: 17 ng/mL — AB (ref 30–100)

## 2014-08-06 ENCOUNTER — Other Ambulatory Visit: Payer: Self-pay | Admitting: Physician Assistant

## 2014-08-06 ENCOUNTER — Other Ambulatory Visit: Payer: Self-pay | Admitting: Internal Medicine

## 2014-08-06 DIAGNOSIS — E782 Mixed hyperlipidemia: Secondary | ICD-10-CM

## 2014-10-11 ENCOUNTER — Other Ambulatory Visit: Payer: Self-pay | Admitting: Physician Assistant

## 2014-11-09 ENCOUNTER — Other Ambulatory Visit: Payer: Self-pay | Admitting: Physician Assistant

## 2014-11-13 ENCOUNTER — Encounter: Payer: Self-pay | Admitting: Internal Medicine

## 2014-11-13 ENCOUNTER — Ambulatory Visit (INDEPENDENT_AMBULATORY_CARE_PROVIDER_SITE_OTHER): Payer: BC Managed Care – PPO | Admitting: Internal Medicine

## 2014-11-13 VITALS — BP 110/70 | HR 56 | Temp 98.1°F | Resp 16 | Ht 72.5 in | Wt 225.8 lb

## 2014-11-13 DIAGNOSIS — I10 Essential (primary) hypertension: Secondary | ICD-10-CM

## 2014-11-13 DIAGNOSIS — E559 Vitamin D deficiency, unspecified: Secondary | ICD-10-CM | POA: Diagnosis not present

## 2014-11-13 DIAGNOSIS — E291 Testicular hypofunction: Secondary | ICD-10-CM

## 2014-11-13 DIAGNOSIS — Z6832 Body mass index (BMI) 32.0-32.9, adult: Secondary | ICD-10-CM | POA: Diagnosis not present

## 2014-11-13 DIAGNOSIS — Z79899 Other long term (current) drug therapy: Secondary | ICD-10-CM

## 2014-11-13 DIAGNOSIS — E785 Hyperlipidemia, unspecified: Secondary | ICD-10-CM

## 2014-11-13 DIAGNOSIS — E1122 Type 2 diabetes mellitus with diabetic chronic kidney disease: Secondary | ICD-10-CM

## 2014-11-13 DIAGNOSIS — E349 Endocrine disorder, unspecified: Secondary | ICD-10-CM

## 2014-11-13 DIAGNOSIS — N182 Chronic kidney disease, stage 2 (mild): Secondary | ICD-10-CM

## 2014-11-13 LAB — CBC WITH DIFFERENTIAL/PLATELET
BASOS ABS: 0.1 10*3/uL (ref 0.0–0.1)
Basophils Relative: 1 % (ref 0–1)
EOS ABS: 0.1 10*3/uL (ref 0.0–0.7)
EOS PCT: 2 % (ref 0–5)
HEMATOCRIT: 42.7 % (ref 39.0–52.0)
Hemoglobin: 14.6 g/dL (ref 13.0–17.0)
LYMPHS ABS: 2.1 10*3/uL (ref 0.7–4.0)
Lymphocytes Relative: 32 % (ref 12–46)
MCH: 29.5 pg (ref 26.0–34.0)
MCHC: 34.2 g/dL (ref 30.0–36.0)
MCV: 86.3 fL (ref 78.0–100.0)
MONOS PCT: 7 % (ref 3–12)
MPV: 10.7 fL (ref 8.6–12.4)
Monocytes Absolute: 0.5 10*3/uL (ref 0.1–1.0)
Neutro Abs: 3.8 10*3/uL (ref 1.7–7.7)
Neutrophils Relative %: 58 % (ref 43–77)
Platelets: 211 10*3/uL (ref 150–400)
RBC: 4.95 MIL/uL (ref 4.22–5.81)
RDW: 13.6 % (ref 11.5–15.5)
WBC: 6.5 10*3/uL (ref 4.0–10.5)

## 2014-11-13 LAB — BASIC METABOLIC PANEL WITH GFR
BUN: 17 mg/dL (ref 7–25)
CHLORIDE: 102 mmol/L (ref 98–110)
CO2: 27 mmol/L (ref 20–31)
CREATININE: 1.2 mg/dL (ref 0.70–1.33)
Calcium: 9 mg/dL (ref 8.6–10.3)
GFR, Est African American: 76 mL/min (ref >=60)
GFR, Est Non African American: 66 mL/min (ref >=60)
GLUCOSE: 157 mg/dL — AB (ref 65–99)
POTASSIUM: 4.3 mmol/L (ref 3.5–5.3)
Sodium: 139 mmol/L (ref 135–146)

## 2014-11-13 LAB — HEPATIC FUNCTION PANEL
ALBUMIN: 4.2 g/dL (ref 3.6–5.1)
ALK PHOS: 103 U/L (ref 40–115)
ALT: 23 U/L (ref 9–46)
AST: 18 U/L (ref 10–35)
Bilirubin, Direct: 0.1 mg/dL (ref <=0.2)
Indirect Bilirubin: 0.4 mg/dL (ref 0.2–1.2)
TOTAL PROTEIN: 7 g/dL (ref 6.1–8.1)
Total Bilirubin: 0.5 mg/dL (ref 0.2–1.2)

## 2014-11-13 LAB — HEMOGLOBIN A1C
HEMOGLOBIN A1C: 6.8 % — AB (ref <5.7)
MEAN PLASMA GLUCOSE: 148 mg/dL — AB (ref <117)

## 2014-11-13 LAB — LIPID PANEL
Cholesterol: 182 mg/dL (ref 125–200)
HDL: 35 mg/dL — ABNORMAL LOW (ref >=40)
LDL CALC: 124 mg/dL (ref <130)
Total CHOL/HDL Ratio: 5.2 Ratio — ABNORMAL HIGH (ref <=5.0)
Triglycerides: 117 mg/dL (ref <150)
VLDL: 23 mg/dL (ref <30)

## 2014-11-13 LAB — MAGNESIUM: MAGNESIUM: 2 mg/dL (ref 1.5–2.5)

## 2014-11-13 LAB — TSH: TSH: 1.734 u[IU]/mL (ref 0.350–4.500)

## 2014-11-13 NOTE — Progress Notes (Signed)
Patient ID: Mark [name], male   DOB: [birth year], 59 y.o.   MRN: [id number]     This very nice 59 y.o. 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      Patient is treated for HTN since 2007 & BP has been controlled at home. Today's BP: 110/70 mmHg. Patient had a negative Cardiolite in 2007. Patient has had no complaints of any cardiac type chest pain, palpitations, dyspnea/orthopnea/PND, dizziness, claudication, or dependent edema.     Hyperlipidemia is controlled with diet & meds. Patient denies myalgias or other med SE's. Last Lipids were at goal - Cholesterol 171; HDL 30*; LDL 75;with elevated Triglycerides 332 on 07/27/2014.      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  Last A1c was 7.9% on 07/27/2014.     Patient has been on Testosterone replacement since April 2012 with a low level of 184. He does report improved sense of well being on therapy. Further, the patient also has history of Vitamin D Deficiency  of 24 in 2013 and sporadically supplements vitamin D without any suspected side-effects. Last vitamin D was still very low at 17 on 07/27/2014.     Medication Sig  . aspirin 81 MG chewable tablet Chew by mouth daily.  Marland Kitchen atorvastatin  40 MG tablet TAKE ONE TABLET BY MOUTH ONCE DAILY  . bisoprolol-hctz (ZIAC) 5-6.25  TAKE ONE TABLET BY MOUTH EVERY DAY FOR BLOOD PRESSURE  . fenofibrate  134 MG capsule TAKE ONE CAPSULE BY MOUTH ONCE DAILY BEFORE BREAKFAST.  Marland Kitchen meclizine  25 MG tablet Take 1 tablet (25 mg total) by mouth 3 (three) times daily as needed for dizziness.  . metFORMIN -XR 500 MG  TAKE TWO TABLETS BY MOUTH TWICE DAILY  . pravastatin  40 MG tablet TAKE ONE TABLET BY MOUTH ONCE DAILY  . ranitidine  300 MG tablet  TAKE ONE TABLET BY MOUTH ONCE DAILY FOR  ACID  REFLUX  . testosterone (TESTIM) 50 MG/5GM (1%) GEL Place 5 g onto the skin daily.  Marland Kitchen DEPO-TESTOTERONE  200 MG/ML inj inject 2 milliliter intramuscularly every 2 weeks   Allergies  Allergen Reactions  . Codeine     REACTION: Anxiety  . Sertraline Swelling   PMHx:   Past Medical History  Diagnosis Date  . Hyperlipidemia   . Hypertension   . Prediabetes   . GERD (gastroesophageal reflux disease)   . Vitamin D deficiency   . Other testicular hypofunction    Immunization History  Administered Date(s) Administered  . Td 03/27/2005   Past Surgical History  Procedure Laterality Date  . Vasectomy  01/2004  . Urethral dilation  2007   FHx:    Reviewed / unchanged  SHx:    Reviewed / unchanged  Systems Review:  Constitutional: Denies fever, chills, wt changes, headaches, insomnia, fatigue, night sweats, change in appetite. Eyes: Denies redness, blurred vision, diplopia, discharge, itchy, watery eyes.  ENT: Denies discharge, congestion, post nasal drip, epistaxis, sore throat, earache, hearing loss, dental pain, tinnitus, vertigo, sinus pain, snoring.  CV: Denies chest pain, palpitations, irregular heartbeat, syncope, dyspnea, diaphoresis, orthopnea, PND, claudication or edema. Respiratory: denies cough, dyspnea, DOE, pleurisy, hoarseness, laryngitis, wheezing.  Gastrointestinal: Denies dysphagia, odynophagia, heartburn, reflux, water brash, abdominal pain  or cramps, nausea, vomiting, bloating, diarrhea, constipation, hematemesis, melena, hematochezia  or hemorrhoids. Genitourinary: Denies dysuria, frequency, urgency, nocturia, hesitancy, discharge, hematuria or flank pain. Musculoskeletal: Denies arthralgias, myalgias, stiffness, jt. swelling, pain, limping or strain/sprain.  Skin: Denies pruritus, rash, hives, warts, acne, eczema or change in skin lesion(s). Neuro: No weakness, tremor, incoordination, spasms, paresthesia or  pain. Psychiatric: Denies confusion, memory loss or sensory loss. Endo: Denies change in weight, skin or hair change.  Heme/Lymph: No excessive bleeding, bruising or enlarged lymph nodes.  Physical Exam  BP 110/70   Pulse 56  Temp 98.1 F Resp 16  Ht 6' 0.5"   Wt 225 lb 12.8 oz     BMI 30.19   Appears well nourished and in no distress. Eyes: PERRLA, EOMs, conjunctiva no swelling or erythema. Sinuses: No frontal/maxillary tenderness ENT/Mouth: EAC's clear, TM's nl w/o erythema, bulging. Nares clear w/o erythema, swelling, exudates. Oropharynx clear without erythema or exudates. Oral hygiene is good. Tongue normal, non obstructing. Hearing intact.  Neck: Supple. Thyroid nl. Car 2+/2+ without bruits, nodes or JVD. Chest: Respirations nl with BS clear & equal w/o rales, rhonchi, wheezing or stridor.  Cor: Heart sounds normal w/ regular rate and rhythm without sig. murmurs, gallops, clicks, or rubs. Peripheral pulses normal and equal  without edema.  Abdomen: Soft & bowel sounds normal. Non-tender w/o guarding, rebound, hernias, masses, or organomegaly.  Lymphatics: Unremarkable.  Musculoskeletal: Full ROM all peripheral extremities, joint stability, 5/5 strength, and normal gait.  Skin: Warm, dry without exposed rashes, lesions or ecchymosis apparent.  Neuro: Cranial nerves intact, reflexes equal bilaterally. Sensory-motor testing grossly intact. Tendon reflexes grossly intact.  Pysch: Alert & oriented x 3.  Insight and judgement nl & appropriate. No ideations.  Assessment and Plan:  1. Essential hypertension  - TSH  2. Hyperlipidemia  - Lipid panel  3. 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 Vitamin D deficiency  - Vit D  25 hydroxy (rtn osteoporosis monitoring)  5. Testosterone deficiency  - Testosterone  6. Morbid obesity (BMI 32)    7. BMI 32.0-32.9,adult   8. Medication management  - CBC with Differential/Platelet - BASIC  METABOLIC PANEL WITH GFR - Hepatic function panel - Magnesium   Recommended regular exercise, BP monitoring, weight control, and discussed med and SE's. Recommended labs to assess and monitor clinical status. Further disposition pending results of labs. Over 30 minutes of exam, counseling, chart review was performed

## 2014-11-13 NOTE — Patient Instructions (Addendum)
9 Ways to Naturally Increase Testosterone Levels  1.   Lose Weight  If you're overweight, shedding the excess pounds may increase your testosterone levels, according to research presented at the Endocrine Society's 2012 meeting. Overweight men are more likely to have low testosterone levels to begin with, so this is an important trick to increase your body's testosterone production when you need it most.  2.   High-Intensity Exercise like Peak Fitness   Short intense exercise has a proven positive effect on increasing testosterone levels and preventing its decline. That's unlike aerobics or prolonged moderate exercise, which have shown to have negative or no effect on testosterone levels. Having a whey protein meal after exercise can further enhance the satiety/testosterone-boosting impact (hunger hormones cause the opposite effect on your testosterone and libido). Here's a summary of what a typical high-intensity Peak Fitness routine might look like: " Warm up for three minutes  " Exercise as hard and fast as you can for 30 seconds. You should feel like you couldn't possibly go on another few seconds  " Recover at a slow to moderate pace for 90 seconds  " Repeat the high intensity exercise and recovery 7 more times .  3.   Consume Plenty of Zinc  The mineral zinc is important for testosterone production, and supplementing your diet for as little as six weeks has been shown to cause a marked improvement in testosterone among men with low levels.1 Likewise, research has shown that restricting dietary sources of zinc leads to a significant decrease in testosterone, while zinc supplementation increases it2 -- and even protects men from exercised-induced reductions in testosterone levels.3 It's estimated that up to 45 percent of adults over the age of 60 may have lower than recommended zinc intakes; even when dietary supplements were added in, an estimated 20-25 percent of older adults still had  inadequate zinc intakes, according to a National Health and Nutrition Examination Survey.4 Your diet is the best source of zinc; along with protein-rich foods like meats and fish, other good dietary sources of zinc include raw milk, raw cheese, beans, and yogurt or kefir made from raw milk. It can be difficult to obtain enough dietary zinc if you're a vegetarian, and also for meat-eaters as well, largely because of conventional farming methods that rely heavily on chemical fertilizers and pesticides. These chemicals deplete the soil of nutrients ... nutrients like zinc that must be absorbed by plants in order to be passed on to you. In many cases, you may further deplete the nutrients in your food by the way you prepare it. For most food, cooking it will drastically reduce its levels of nutrients like zinc . particularly over-cooking, which many people do. If you decide to use a zinc supplement, stick to a dosage of less than 40 mg a day, as this is the recommended adult upper limit. Taking too much zinc can interfere with your body's ability to absorb other minerals, especially copper, and may cause nausea as a side effect.  4.   Strength Training  In addition to Peak Fitness, strength training is also known to boost testosterone levels, provided you are doing so intensely enough. When strength training to boost testosterone, you'll want to increase the weight and lower your number of reps, and then focus on exercises that work a large number of muscles, such as dead lifts or squats.  You can "turbo-charge" your weight training by going slower. By slowing down your movement, you're actually turning it into a   high-intensity exercise. Super Slow movement allows your muscle, at the microscopic level, to access the maximum number of cross-bridges between the protein filaments that produce movement in the muscle.   5.   Optimize Your Vitamin D Levels  Vitamin D, a steroid hormone, is essential for the  healthy development of the nucleus of the sperm cell, and helps maintain semen quality and sperm count. Vitamin D also increases levels of testosterone, which may boost libido. In one study, overweight men who were given vitamin D supplements had a significant increase in testosterone levels after one year.5   6.   Reduce Stress  When you're under a lot of stress, your body releases high levels of the stress hormone cortisol. This hormone actually blocks the effects of testosterone,6 presumably because, from a biological standpoint, testosterone-associated behaviors (mating, competing, aggression) may have lowered your chances of survival in an emergency (hence, the "fight or flight" response is dominant, courtesy of cortisol).  7.   Limit or Eliminate Sugar from Your Diet  Testosterone levels decrease after you eat sugar, which is likely because the sugar leads to a high insulin level, another factor leading to low testosterone.7 Based on USDA estimates, the average American consumes 12 teaspoons of sugar a day, which equates to about TWO TONS of sugar during a lifetime.  8.   Eat Healthy Fats  By healthy, this means not only mono- and polyunsaturated fats, like that found in avocadoes and nuts, but also saturated, as these are essential for building testosterone. Research shows that a diet with less than 40 percent of energy as fat (and that mainly from animal sources, i.e. saturated) lead to a decrease in testosterone levels. ie eat less animal products - as Meat , poultry and dairy. Experts agree that the ideal diet includes somewhere between 50-70 percent fat.  It's important to understand that your body requires saturated fats from animal and vegetable sources (such as meat, dairy, certain oils, and tropical plants like coconut) for optimal functioning, and if you neglect this important food group in favor of sugar, grains and other starchy carbs, your health and weight are almost guaranteed to  suffer. Examples of healthy fats you can eat more of to give your testosterone levels a boost include: Olives and Olive oil  Coconuts and coconut oil Butter made from raw grass-fed organic milk Raw nuts, such as, almonds or pecans Organic pastured egg yolks Avocados Grass-fed meats Palm oil Unheated organic nut oils   9.   Boost Your Intake of Branch Chain Amino Acids (BCAA) from Foods Like Whey Protein Research suggests that BCAAs result in higher testosterone levels, particularly when taken along with resistance training. While BCAAs are available in supplement form, you'll find the highest concentrations of BCAAs like leucine in whey protein. Even when getting leucine from your natural food supply, it's often wasted or used as a building block instead of an anabolic agent. So to create the correct anabolic environment, you need to boost leucine consumption way beyond mere maintenance levels. That said, keep in mind that using leucine as a free form amino acid can be highly counterproductive as when free form amino acids are artificially administrated, they rapidly enter your circulation while disrupting insulin function, and impairing your body's glycemic control. Food-based leucine is really the ideal form that can benefit your muscles without side effects.   +++++++++++++++++++++++++++++++ Recommend Adult Low dose Aspirin or   coated  Aspirin 81 mg daily   To reduce risk of Colon   Cancer 20 %,   Skin Cancer 26 % ,   Melanoma 46%   and   Pancreatic cancer 60%  ++++++++++++++++++  Vitamin D goal   is between 70-100.   Please make sure that you are taking your Vitamin D as directed.   It is very important as a natural anti-inflammatory   helping hair, skin, and nails, as well as reducing stroke and heart attack risk.   It helps your bones and helps with mood.  It also decreases numerous cancer risks so please take it as directed.   Low Vit D is associated with a 200-300%  higher risk for CANCER   and 200-300% higher risk for HEART   ATTACK  &  STROKE.   ......................................  It is also associated with higher death rate at younger ages,   autoimmune diseases like Rheumatoid arthritis, Lupus, Multiple Sclerosis.     Also many other serious conditions, like depression, Alzheimer's  Dementia, infertility, muscle aches, fatigue, fibromyalgia - just to name a few.  +++++++++++++++++++  Recommend the book "The END of DIETING" by Dr Joel Fuhrman   & the book "The END of DIABETES " by Dr Joel Fuhrman  At [employer].com - get book & Audio CD's     Being diabetic has a  300% increased risk for heart attack, stroke, cancer, and alzheimer- type vascular dementia. It is very important that you work harder with diet by avoiding all foods that are white. Avoid white rice (brown & wild rice is OK), white potatoes (sweetpotatoes in moderation is OK), White bread or wheat bread or anything made out of white flour like bagels, donuts, rolls, buns, biscuits, cakes, pastries, cookies, pizza crust, and pasta (made from white flour & egg whites) - vegetarian pasta or spinach or wheat pasta is OK. Multigrain breads like Arnold's or Pepperidge Farm, or multigrain sandwich thins or flatbreads.  Diet, exercise and weight loss can reverse and cure diabetes in the early stages.  Diet, exercise and weight loss is very important in the control and prevention of complications of diabetes which affects every system in your body, ie. Brain - dementia/stroke, eyes - glaucoma/blindness, heart - heart attack/heart failure, kidneys - dialysis, stomach - gastric paralysis, intestines - malabsorption, nerves - severe painful neuritis, circulation - gangrene & loss of a leg(s), and finally cancer and Alzheimers.    I recommend avoid fried & greasy foods,  sweets/candy, white rice (brown or wild rice or Quinoa is OK), white potatoes (sweet potatoes are OK) - anything made from white flour  - bagels, doughnuts, rolls, buns, biscuits,white and wheat breads, pizza crust and traditional pasta made of white flour & egg white(vegetarian pasta or spinach or wheat pasta is OK).  Multi-grain bread is OK - like multi-grain flat bread or sandwich thins. Avoid alcohol in excess. Exercise is also important.    Eat all the vegetables you want - avoid meat, especially red meat and dairy - especially cheese.  Cheese is the most concentrated form of trans-fats which is the worst thing to clog up our arteries. Veggie cheese is OK which can be found in the fresh produce section at [employer] or [employer] or [employer]  ++++++++++++++++++++++++++    

## 2014-11-14 ENCOUNTER — Other Ambulatory Visit: Payer: Self-pay | Admitting: Internal Medicine

## 2014-11-14 DIAGNOSIS — E782 Mixed hyperlipidemia: Secondary | ICD-10-CM

## 2014-11-14 LAB — INSULIN, RANDOM: INSULIN: 16.5 u[IU]/mL (ref 2.0–19.6)

## 2014-11-14 LAB — TESTOSTERONE: TESTOSTERONE: 202 ng/dL — AB (ref 300–890)

## 2014-11-14 LAB — VITAMIN D 25 HYDROXY (VIT D DEFICIENCY, FRACTURES): VIT D 25 HYDROXY: 22 ng/mL — AB (ref 30–100)

## 2014-11-14 MED ORDER — ATORVASTATIN CALCIUM 80 MG PO TABS: ORAL_TABLET | ORAL | Status: DC

## 2014-11-15 ENCOUNTER — Encounter: Payer: Self-pay | Admitting: Internal Medicine

## 2014-11-15 NOTE — Addendum Note (Signed)
Addended by: Lucky Cowboy on: 11/15/2014 04:06 PM   Modules accepted: Orders

## 2014-11-24 ENCOUNTER — Other Ambulatory Visit: Payer: Self-pay | Admitting: *Deleted

## 2014-11-24 MED ORDER — TESTOSTERONE CYPIONATE 200 MG/ML IM SOLN: INTRAMUSCULAR | Status: DC

## 2014-12-01 ENCOUNTER — Ambulatory Visit (INDEPENDENT_AMBULATORY_CARE_PROVIDER_SITE_OTHER): Payer: BC Managed Care – PPO

## 2014-12-01 DIAGNOSIS — E291 Testicular hypofunction: Secondary | ICD-10-CM | POA: Diagnosis not present

## 2014-12-01 MED ORDER — TESTOSTERONE CYPIONATE 200 MG/ML IM SOLN
400.0000 mg | INTRAMUSCULAR | Status: DC
  Administered 2014-12-01 – 2015-10-18 (×11): 400 mg via INTRAMUSCULAR

## 2014-12-08 ENCOUNTER — Other Ambulatory Visit: Payer: Self-pay | Admitting: Internal Medicine

## 2014-12-15 ENCOUNTER — Ambulatory Visit: Payer: Self-pay

## 2014-12-15 ENCOUNTER — Ambulatory Visit (INDEPENDENT_AMBULATORY_CARE_PROVIDER_SITE_OTHER): Payer: BC Managed Care – PPO | Admitting: *Deleted

## 2014-12-15 DIAGNOSIS — E291 Testicular hypofunction: Secondary | ICD-10-CM

## 2014-12-15 NOTE — Progress Notes (Signed)
Patient ID: Mark [name], male   DOB: 08/09/1955, 58 y.o.   MRN: [id number] Patient presents for testosterone injection for hypogonadism Tx.  Patient received 2 cc IM left glute and tolerated well. 

## 2014-12-29 ENCOUNTER — Ambulatory Visit: Payer: Self-pay

## 2014-12-31 ENCOUNTER — Ambulatory Visit: Payer: Self-pay

## 2015-01-05 ENCOUNTER — Ambulatory Visit (INDEPENDENT_AMBULATORY_CARE_PROVIDER_SITE_OTHER): Payer: BC Managed Care – PPO | Admitting: *Deleted

## 2015-01-05 DIAGNOSIS — E291 Testicular hypofunction: Secondary | ICD-10-CM

## 2015-01-05 MED ORDER — TESTOSTERONE CYPIONATE 200 MG/ML IM SOLN: 400.0000 mg | Freq: Once | INTRAMUSCULAR | Status: DC

## 2015-01-05 NOTE — Progress Notes (Signed)
Patient ID: Mark [name], male   DOB: 01/14/1956, 59 y.o.   MRN: [id number]  Patient presents for testosterone injection for hypogonadism Tx.  Patient received 2 cc IM right glute and tolerated well.  Will RTC in 2 weeks for nect injection.

## 2015-02-05 ENCOUNTER — Ambulatory Visit (INDEPENDENT_AMBULATORY_CARE_PROVIDER_SITE_OTHER): Payer: BC Managed Care – PPO | Admitting: *Deleted

## 2015-02-05 DIAGNOSIS — E291 Testicular hypofunction: Secondary | ICD-10-CM

## 2015-02-05 NOTE — Progress Notes (Signed)
Patient ID: Mark BeagleMichael J [name], male   DOB: [birth year], 59 y.o.   MRN: [id number] Patient presents for testosterone injection for hypogonadism Tx.  Patient received 2 cc IM left glute and tolerated well.

## 2015-03-10 ENCOUNTER — Ambulatory Visit (INDEPENDENT_AMBULATORY_CARE_PROVIDER_SITE_OTHER): Payer: BC Managed Care – PPO

## 2015-03-10 DIAGNOSIS — E291 Testicular hypofunction: Secondary | ICD-10-CM

## 2015-03-10 NOTE — Progress Notes (Signed)
Patient presents for testosterone injection for hypogonadism Tx. Patient received 2 cc IM RIGHT glute and tolerated well.  

## 2015-03-23 ENCOUNTER — Ambulatory Visit (INDEPENDENT_AMBULATORY_CARE_PROVIDER_SITE_OTHER): Payer: BC Managed Care – PPO | Admitting: Physician Assistant

## 2015-03-23 VITALS — BP 132/78 | HR 100 | Temp 98.4°F | Resp 14 | Ht 72.5 in | Wt 230.0 lb

## 2015-03-23 DIAGNOSIS — N182 Chronic kidney disease, stage 2 (mild): Secondary | ICD-10-CM

## 2015-03-23 DIAGNOSIS — Z79899 Other long term (current) drug therapy: Secondary | ICD-10-CM | POA: Diagnosis not present

## 2015-03-23 DIAGNOSIS — J0100 Acute maxillary sinusitis, unspecified: Secondary | ICD-10-CM

## 2015-03-23 DIAGNOSIS — E559 Vitamin D deficiency, unspecified: Secondary | ICD-10-CM | POA: Diagnosis not present

## 2015-03-23 DIAGNOSIS — M791 Myalgia, unspecified site: Secondary | ICD-10-CM

## 2015-03-23 DIAGNOSIS — I10 Essential (primary) hypertension: Secondary | ICD-10-CM

## 2015-03-23 DIAGNOSIS — E349 Endocrine disorder, unspecified: Secondary | ICD-10-CM

## 2015-03-23 DIAGNOSIS — E291 Testicular hypofunction: Secondary | ICD-10-CM | POA: Diagnosis not present

## 2015-03-23 DIAGNOSIS — E785 Hyperlipidemia, unspecified: Secondary | ICD-10-CM

## 2015-03-23 DIAGNOSIS — E1122 Type 2 diabetes mellitus with diabetic chronic kidney disease: Secondary | ICD-10-CM

## 2015-03-23 LAB — LIPID PANEL
CHOL/HDL RATIO: 4.6 ratio (ref <=5.0)
CHOLESTEROL: 153 mg/dL (ref 125–200)
HDL: 33 mg/dL — AB (ref >=40)
LDL Cholesterol: 81 mg/dL (ref <130)
Triglycerides: 193 mg/dL — ABNORMAL HIGH (ref <150)
VLDL: 39 mg/dL — ABNORMAL HIGH (ref <30)

## 2015-03-23 LAB — CBC WITH DIFFERENTIAL/PLATELET
BASOS PCT: 0 % (ref 0–1)
Basophils Absolute: 0 10*3/uL (ref 0.0–0.1)
EOS ABS: 0.3 10*3/uL (ref 0.0–0.7)
EOS PCT: 4 % (ref 0–5)
HCT: 47.2 % (ref 39.0–52.0)
HEMOGLOBIN: 15.8 g/dL (ref 13.0–17.0)
Lymphocytes Relative: 23 % (ref 12–46)
Lymphs Abs: 1.9 10*3/uL (ref 0.7–4.0)
MCH: 29.4 pg (ref 26.0–34.0)
MCHC: 33.5 g/dL (ref 30.0–36.0)
MCV: 87.9 fL (ref 78.0–100.0)
MONO ABS: 0.9 10*3/uL (ref 0.1–1.0)
MONOS PCT: 11 % (ref 3–12)
MPV: 10.3 fL (ref 8.6–12.4)
Neutro Abs: 5 10*3/uL (ref 1.7–7.7)
Neutrophils Relative %: 62 % (ref 43–77)
Platelets: 184 10*3/uL (ref 150–400)
RBC: 5.37 MIL/uL (ref 4.22–5.81)
RDW: 12.7 % (ref 11.5–15.5)
WBC: 8.1 10*3/uL (ref 4.0–10.5)

## 2015-03-23 LAB — HEPATIC FUNCTION PANEL
ALBUMIN: 3.8 g/dL (ref 3.6–5.1)
ALT: 28 U/L (ref 9–46)
AST: 19 U/L (ref 10–35)
Alkaline Phosphatase: 95 U/L (ref 40–115)
BILIRUBIN DIRECT: 0.1 mg/dL (ref <=0.2)
BILIRUBIN TOTAL: 0.5 mg/dL (ref 0.2–1.2)
Indirect Bilirubin: 0.4 mg/dL (ref 0.2–1.2)
Total Protein: 6.7 g/dL (ref 6.1–8.1)

## 2015-03-23 LAB — BASIC METABOLIC PANEL WITH GFR
BUN: 13 mg/dL (ref 7–25)
CALCIUM: 9.1 mg/dL (ref 8.6–10.3)
CO2: 26 mmol/L (ref 20–31)
Chloride: 99 mmol/L (ref 98–110)
Creat: 1.08 mg/dL (ref 0.70–1.25)
GFR, Est African American: 86 mL/min (ref >=60)
GFR, Est Non African American: 74 mL/min (ref >=60)
Glucose, Bld: 193 mg/dL — ABNORMAL HIGH (ref 65–99)
Potassium: 4 mmol/L (ref 3.5–5.3)
SODIUM: 136 mmol/L (ref 135–146)

## 2015-03-23 LAB — HEMOGLOBIN A1C
Hgb A1c MFr Bld: 7 % — ABNORMAL HIGH (ref <5.7)
MEAN PLASMA GLUCOSE: 154 mg/dL — AB (ref <117)

## 2015-03-23 LAB — POC INFLUENZA A&B (BINAX/QUICKVUE)
INFLUENZA A, POC: NEGATIVE
INFLUENZA B, POC: NEGATIVE

## 2015-03-23 LAB — TSH: TSH: 1.653 u[IU]/mL (ref 0.350–4.500)

## 2015-03-23 LAB — CK: Total CK: 101 U/L (ref 7–232)

## 2015-03-23 MED ORDER — SULFAMETHOXAZOLE-TRIMETHOPRIM 800-160 MG PO TABS: 1.0000 | ORAL_TABLET | Freq: Two times a day (BID) | ORAL | Status: DC

## 2015-03-23 MED ORDER — PREDNISONE 20 MG PO TABS: ORAL_TABLET | ORAL | Status: DC

## 2015-03-23 NOTE — Patient Instructions (Signed)
Sinusitis can be uncomfortable. People with sinusitis have congestion with yellow/green/gray discharge, sinus pain/pressure, pain around the eyes. Sinus infections almost ALWAYS stem from a viral infection and antibiotics don't work against a virus. Even when bacteria is responsible, the infections usually clear up on their own in a week or so.   PLEASE TRY TO DO OVER THE COUNTER TREATMENT AND PREDNISONE FOR 5-7 DAYS AND IF YOU ARE NOT GETTING BETTER OR GETTING WORSE THEN YOU CAN START ON AN ANTIBIOTIC GIVEN.  Can take the prednisone AT NIGHT WITH DINNER, it take 8-12 hours to start working so it will NOT affect your sleeping if you take it at night with your food!! Take two pills the first night and 1 or two pill the second night and then 1 pill the other nights.   Risk of antibiotic use: About 1 in 4 people who take antibiotics have side effects including stomach problems, dizziness, or rashes. Those problems clear up soon after stopping the drugs, but in rare cases antibiotics can cause severe allergic reaction. Over use of antibiotics also encourages the growth of bacteria that can't be controlled easily with drugs. That makes you more vunerable to antibiotic-resistant infections and undermines the benefits of antibiotics for others.   Waste of Money: Antibiotics often aren't very expensive, but any money spent on unnecessary drugs is money down the drain.   When are antibiotics needed? Only when symptoms last longer than a week.  Start to improve but then worsen again  -It can take up to 2 weeks to feel better.   -If you do not get better in 7-10 days (Have fever, facial pain, dental pain and swelling), then please call the office and it is now appropriate to start an antibiotic.   -Please take Tylenol or Ibuprofen for pain. -Acetaminiphen 325mg orally every 4-6 hours for pain.  Max: 10 per day -Ibuprofen 200mg orally every 6-8 hours for pain.  Take with food to avoid ulcers.   Max 10 per  day  Please pick one of the over the counter allergy medications below and take it once daily for allergies.  Claritin or loratadine cheapest but likely the weakest  Zyrtec or certizine at night because it can make you sleepy The strongest is allegra or fexafinadine  Cheapest at [employer], [employer], [employer]  -While drinking fluids, pinch and hold nose close and swallow.  This will help open up your eustachian tubes to drain the fluid behind your ear drums. -Try steam showers to open your nasal passages.   Drink lots of water to stay hydrated and to thin mucous.  Flonase/Nasonex is to help the inflammation.  Take 2 sprays in each nostril at bedtime.  Make sure you spray towards the outside of each nostril towards the outer corner of your eye, hold nose close and tilt head back.  This will help the medication get into your sinuses.  If you do not like this medication, then use saline nasal sprays same directions as above for Flonase. Stop the medication right away if you get blurring of your vision or nose bleeds.  Sinusitis Sinusitis is redness, soreness, and inflammation of the paranasal sinuses. Paranasal sinuses are air pockets within the bones of your face (beneath the eyes, the middle of the forehead, or above the eyes). In healthy paranasal sinuses, mucus is able to drain out, and air is able to circulate through them by way of your nose. However, when your paranasal sinuses are inflamed, mucus and air can   become trapped. This can allow bacteria and other germs to grow and cause infection. Sinusitis can develop quickly and last only a short time (acute) or continue over a long period (chronic). Sinusitis that lasts for more than 12 weeks is considered chronic.  CAUSES  Causes of sinusitis include: Allergies. Structural abnormalities, such as displacement of the cartilage that separates your nostrils (deviated septum), which can decrease the air flow through your nose and sinuses and affect sinus  drainage. Functional abnormalities, such as when the small hairs (cilia) that line your sinuses and help remove mucus do not work properly or are not present. SIGNS AND SYMPTOMS  Symptoms of acute and chronic sinusitis are the same. The primary symptoms are pain and pressure around the affected sinuses. Other symptoms include: Upper toothache. Earache. Headache. Bad breath. Decreased sense of smell and taste. A cough, which worsens when you are lying flat. Fatigue. Fever. Thick drainage from your nose, which often is green and may contain pus (purulent). Swelling and warmth over the affected sinuses. DIAGNOSIS  Your health care provider will perform a physical exam. During the exam, your health care provider may: Look in your nose for signs of abnormal growths in your nostrils (nasal polyps).  Tap over the affected sinus to check for signs of infection. View the inside of your sinuses (endoscopy) using an imaging device that has a light attached (endoscope). If your health care provider suspects that you have chronic sinusitis, one or more of the following tests may be recommended: Allergy tests. Nasal culture. A sample of mucus is taken from your nose, sent to a lab, and screened for bacteria. Nasal cytology. A sample of mucus is taken from your nose and examined by your health care provider to determine if your sinusitis is related to an allergy. TREATMENT  Most cases of acute sinusitis are related to a viral infection and will resolve on their own within 10 days. Sometimes medicines are prescribed to help relieve symptoms (pain medicine, decongestants, nasal steroid sprays, or saline sprays).  However, for sinusitis related to a bacterial infection, your health care provider will prescribe antibiotic medicines. These are medicines that will help kill the bacteria causing the infection.  Rarely, sinusitis is caused by a fungal infection. In theses cases, your health care provider will  prescribe antifungal medicine. For some cases of chronic sinusitis, surgery is needed. Generally, these are cases in which sinusitis recurs more than 3 times per year, despite other treatments. HOME CARE INSTRUCTIONS  Drink plenty of water. Water helps thin the mucus so your sinuses can drain more easily. Use a humidifier. Inhale steam 3 to 4 times a day (for example, sit in the bathroom with the shower running). Apply a warm, moist washcloth to your face 3 to 4 times a day, or as directed by your health care provider. Use saline nasal sprays to help moisten and clean your sinuses. Take medicines only as directed by your health care provider. If you were prescribed either an antibiotic or antifungal medicine, finish it all even if you start to feel better. SEEK IMMEDIATE MEDICAL CARE IF: You have increasing pain or severe headaches. You have nausea, vomiting, or drowsiness. You have swelling around your face. You have vision problems. You have a stiff neck. You have difficulty breathing. MAKE SURE YOU:  Understand these instructions. Will watch your condition. Will get help right away if you are not doing well or get worse. Document Released: 03/13/2005 Document Revised: 07/28/2013 Document Reviewed: 03/28/2011 ExitCare   Patient Information 2015 [employer] [place]. This information is not intended to replace advice given to you by your health care provider. Make sure you discuss any questions you have with your health care provider.   Diabetes is a very complicated disease...lets simplify it.  An easy way to look at it to understand the complications is if you think of the extra sugar floating in your blood stream as glass shards floating through your blood stream.    Diabetes affects your small vessels first: 1) The glass shards (sugar) scraps down the tiny blood vessels in your eyes and lead to diabetic retinopathy, the leading cause of blindness in the [place]. Diabetes is the leading cause  of newly diagnosed adult (2020 to 60 years of age) blindness in the [place].  2) The glass shards scratches down the tiny vessels of your legs leading to nerve damage called neuropathy and can lead to amputations of your feet. More than 60% of all non-traumatic amputations of lower limbs occur in people with diabetes.  3) Over time the small vessels in your brain are shredded and closed off, individually this does not cause any problems but over a long period of time many of the small vessels being blocked can lead to Vascular Dementia.   4) Your kidney's are a filter system and have a "net" that keeps certain things in the body and lets bad things out. Sugar shreds this net and leads to kidney damage and eventually failure. Decreasing the sugar that is destroying the net and certain blood pressure medications can help stop or decrease progression of kidney disease. Diabetes was the primary cause of kidney failure in 44 percent of all new cases in 2011.  5) Diabetes also destroys the small vessels in your penis that lead to erectile dysfunction. Eventually the vessels are so damaged that you may not be responsive to cialis or viagra.   Diabetes and your large vessels: Your larger vessels consist of your coronary arteries in your heart and the carotid vessels to your brain. Diabetes or even increased sugars put you at 300% increased risk of heart attack and stroke and this is why.. The sugar scrapes down your large blood vessels and your body sees this as an internal injury and tries to repair itself. Just like you get a scab on your skin, your platelets will stick to the blood vessel wall trying to heal it. This is why we have diabetics on low dose aspirin daily, this prevents the platelets from sticking and can prevent plaque formation. In addition, your body takes cholesterol and tries to shove it into the open wound. This is why we want your LDL, or bad cholesterol, below 70.   The combination  of platelets and cholesterol over 5-10 years forms plaque that can break off and cause a heart attack or stroke.   PLEASE REMEMBER:  Diabetes is preventable! Up to 85 percent of complications and morbidities among individuals with type 2 diabetes can be prevented, delayed, or effectively treated and minimized with regular visits to a health professional, appropriate monitoring and medication, and a healthy diet and lifestyle.  Recommendations For Diabetic/Prediabetic Patients:   -  Take medications as prescribed  -  Recommend Dr Francis DowseJoel Fuhrman's book "The End of Diabetes "  And "The End of Dieting"- Can get at  www.Amazon.com and encourage also get the Audio CD book  - AVOID Animal products, ie. Meat - red/white, Poultry and Dairy/especially cheese - Exercise at least 5 times  a week for 30 minutes or preferably daily.  - No Smoking - Drink less than 2 drinks a day.  - Monitor your feet for sores - Have yearly Eye Exams - Recommend annual Flu vaccine  - Recommend Pneumovax and Prevnar vaccines - Shingles Vaccine (Zostavax) if over 33 y.o.  Goals:   - BMI less than 24 - Fasting sugar less than 130 or less than 150 if tapering medicines to lose weight  - Systolic BP less than 130  - Diastolic BP less than 80 - Bad LDL Cholesterol less than 70 - Triglycerides less than 150

## 2015-03-23 NOTE — Progress Notes (Signed)
Assessment and Plan:  1. Hypertension -Continue medication, monitor blood pressure at home. Continue DASH diet.  Reminder to go to the ER if any CP, SOB, nausea, dizziness, severe HA, changes vision/speech, left arm numbness and tingling and jaw pain.  2. Cholesterol -Continue diet and exercise. Check cholesterol.   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 Check A1C  4. Vitamin D Def - check level and continue medications.   5. Myalgias/ sinus symptoms Negative flu Bactrim, prednisone  Continue diet and meds as discussed. Further disposition pending results of labs. Discussed med's effects and SE's.    Over 30 minutes of exam, counseling, chart review, and critical decision making was performed   HPI 59 y.o. male  presents for 3 month follow up on hypertension, cholesterol, diabetes and vitamin D deficiency.   His blood pressure has been controlled at home, today his BP is BP: 132/78 mmHg.  He does not workout. He denies chest pain, shortness of breath, dizziness. Patient complains of christmas eve with some feeling bad, then woke up christmas day with HA, muscle aches, sinus congestion, dry cough, no fever/chills.   He is on cholesterol medication and denies myalgias. His cholesterol is not at goal,, he was switched to lipitor last visit. The cholesterol was:   Lab Results  Component Value Date   CHOL 182 11/13/2014   HDL 35* 11/13/2014   LDLCALC 124 11/13/2014   TRIG 117 11/13/2014   CHOLHDL 5.2* 11/13/2014    He has been working on diet and exercise for diabetes with diabetic chronic kidney disease, he is on bASA, he is not on ACE/ARB, and denies  paresthesia of the feet, polydipsia, polyuria and visual disturbances. Last A1C was:  Lab Results  Component Value Date   HGBA1C 6.8* 11/13/2014  Last GFR:   Lab Results  Component Value Date   GFRNONAA 66 11/13/2014   Patient is on Vitamin D supplement. Lab Results  Component Value  Date   VD25OH 22* 11/13/2014   He has a history of testosterone deficiency and is on testosterone replacement. He states that the testosterone helps with his energy, libido, muscle mass. Lab Results  Component Value Date   TESTOSTERONE 202* 11/13/2014   BMI is Body mass index is 30.75 kg/(m^2)., he is working on diet and exercise. Wt Readings from Last 3 Encounters:  03/23/15 230 lb (104.327 kg)  11/13/14 225 lb 12.8 oz (102.422 kg)  07/27/14 239 lb (108.41 kg)    Current Medications:  Current Outpatient Prescriptions on File Prior to Visit  Medication Sig Dispense Refill  . aspirin 81 MG chewable tablet Chew by mouth daily.    Marland Kitchen atorvastatin (LIPITOR) 80 MG tablet Take 1/2 to 1 tablet daily or as directed for Cholesterol 90 tablet 1  . bisoprolol-hydrochlorothiazide (ZIAC) 5-6.25 MG per tablet TAKE ONE TABLET BY MOUTH EVERY DAY FOR BLOOD PRESSURE 90 tablet 0  . metFORMIN (GLUCOPHAGE-XR) 500 MG 24 hr tablet TAKE TWO TABLETS BY MOUTH TWICE DAILY 120 tablet 0  . pravastatin (PRAVACHOL) 40 MG tablet     . ranitidine (ZANTAC) 300 MG tablet TAKE ONE TABLET BY MOUTH ONCE DAILY FOR ACID REFLUX 90 tablet 1  . testosterone cypionate (DEPOTESTOSTERONE CYPIONATE) 200 MG/ML injection Inject 2 cc IM every 2 weeks or as directed. 10 mL 1   Current Facility-Administered Medications on File Prior to Visit  Medication Dose Route Frequency Provider Last Rate Last Dose  . testosterone cypionate (DEPOTESTOSTERONE CYPIONATE) injection 400 mg  400 mg Intramuscular Q14 Days Lucky CowboyWilliam McKeown, MD   400 mg at 03/10/15 1032   Medical History:  Past Medical History  Diagnosis Date  . Hyperlipidemia   . Hypertension   . Prediabetes   . GERD (gastroesophageal reflux disease)   . Vitamin D deficiency   . Other testicular hypofunction    Allergies:  Allergies  Allergen Reactions  . Codeine     REACTION: Anxiety  . Sertraline Swelling     Review of Systems:  Review of Systems  Constitutional:  Positive for malaise/fatigue. Negative for fever, chills, weight loss and diaphoresis.  HENT: Positive for congestion and sore throat. Negative for ear discharge, ear pain, hearing loss, nosebleeds and tinnitus.   Eyes: Negative for blurred vision, double vision, photophobia, pain, discharge and redness.  Respiratory: Negative.  Negative for stridor.   Cardiovascular: Negative.  Negative for chest pain.  Gastrointestinal: Negative.   Genitourinary: Positive for dysuria. Negative for urgency, frequency, hematuria and flank pain.  Musculoskeletal: Positive for myalgias. Negative for back pain, joint pain, falls and neck pain.  Skin: Negative.   Neurological: Positive for headaches. Negative for dizziness, tingling, tremors, sensory change, speech change, focal weakness, seizures, loss of consciousness and weakness.  Psychiatric/Behavioral: Negative.  Negative for depression. The patient is not nervous/anxious.     Family history- Review and unchanged Social history- Review and unchanged Physical Exam: BP 132/78 mmHg  Pulse 100  Temp(Src) 98.4 F (36.9 C) (Temporal)  Resp 14  Ht 6' 0.5" (1.842 m)  Wt 230 lb (104.327 kg)  BMI 30.75 kg/m2  SpO2 93% Wt Readings from Last 3 Encounters:  03/23/15 230 lb (104.327 kg)  11/13/14 225 lb 12.8 oz (102.422 kg)  07/27/14 239 lb (108.41 kg)   General Appearance: Well nourished, in no apparent distress. Eyes: PERRLA, EOMs, conjunctiva no swelling or erythema Sinuses: + Frontal/maxillary tenderness ENT/Mouth: Ext aud canals clear, TMs without erythema, bulging. No erythema, swelling, or exudate on post pharynx.  Tonsils not swollen or erythematous. Hearing normal.  Neck: Supple, thyroid normal.  Respiratory: Respiratory effort normal, BS equal bilaterally without rales, rhonchi, wheezing or stridor.  Cardio: RRR with no MRGs. Brisk peripheral pulses without edema.  Abdomen: Soft, + BS.  Non tender, no guarding, rebound, hernias,  masses. Lymphatics: Non tender without lymphadenopathy.  Musculoskeletal: Full ROM, 5/5 strength, Normal gait Skin: Warm, dry without rashes, lesions, ecchymosis.  Neuro: Cranial nerves intact. No cerebellar symptoms.  Psych: Awake and oriented X 3, normal affect, Insight and Judgment appropriate.    Quentin MullingAmanda Collier, PA-C 11:28 AM [facility]

## 2015-03-24 ENCOUNTER — Other Ambulatory Visit: Payer: Self-pay | Admitting: Internal Medicine

## 2015-03-25 ENCOUNTER — Telehealth: Payer: Self-pay | Admitting: Physician Assistant

## 2015-03-25 ENCOUNTER — Other Ambulatory Visit: Payer: Self-pay | Admitting: Physician Assistant

## 2015-03-25 ENCOUNTER — Other Ambulatory Visit: Payer: Self-pay

## 2015-03-25 MED ORDER — EZETIMIBE 10 MG PO TABS: 10.0000 mg | ORAL_TABLET | Freq: Every day | ORAL | Status: DC

## 2015-03-25 NOTE — Telephone Encounter (Signed)
Patient calling, staying that he is having muscle aches with the lipitor, will stop and try zetia.

## 2015-03-30 ENCOUNTER — Other Ambulatory Visit: Payer: Self-pay | Admitting: Internal Medicine

## 2015-04-06 ENCOUNTER — Other Ambulatory Visit: Payer: Self-pay | Admitting: *Deleted

## 2015-04-06 MED ORDER — TESTOSTERONE CYPIONATE 200 MG/ML IM SOLN: INTRAMUSCULAR | Status: DC

## 2015-04-12 ENCOUNTER — Ambulatory Visit (INDEPENDENT_AMBULATORY_CARE_PROVIDER_SITE_OTHER): Payer: BC Managed Care – PPO

## 2015-04-12 DIAGNOSIS — E291 Testicular hypofunction: Secondary | ICD-10-CM

## 2015-04-12 NOTE — Progress Notes (Signed)
Patient presents for testosterone injection for hypogonadism Tx. Patient received 2 cc IM LEFT glute and tolerated well. 

## 2015-05-04 ENCOUNTER — Encounter: Payer: Self-pay | Admitting: Physician Assistant

## 2015-05-06 ENCOUNTER — Encounter: Payer: Self-pay | Admitting: Internal Medicine

## 2015-05-06 ENCOUNTER — Ambulatory Visit (INDEPENDENT_AMBULATORY_CARE_PROVIDER_SITE_OTHER): Payer: BC Managed Care – PPO | Admitting: Internal Medicine

## 2015-05-06 VITALS — BP 136/82 | HR 76 | Temp 97.9°F | Resp 16 | Ht 72.5 in | Wt 231.6 lb

## 2015-05-06 DIAGNOSIS — E291 Testicular hypofunction: Secondary | ICD-10-CM | POA: Diagnosis not present

## 2015-05-06 DIAGNOSIS — T63301A Toxic effect of unspecified spider venom, accidental (unintentional), initial encounter: Secondary | ICD-10-CM

## 2015-05-06 MED ORDER — PREDNISONE 20 MG PO TABS
ORAL_TABLET | ORAL | Status: DC
Start: 2015-05-06 — End: 2015-05-15

## 2015-05-06 MED ORDER — TESTOSTERONE CYPIONATE 200 MG/ML IM SOLN
400.0000 mg | Freq: Once | INTRAMUSCULAR | Status: AC
  Administered 2015-05-06: 400 mg via INTRAMUSCULAR

## 2015-05-06 MED ORDER — HYDROCODONE-ACETAMINOPHEN 5-325 MG PO TABS: ORAL_TABLET | ORAL | Status: DC

## 2015-05-06 MED ORDER — DOXYCYCLINE HYCLATE 100 MG PO CAPS: ORAL_CAPSULE | ORAL | Status: DC

## 2015-05-06 NOTE — Progress Notes (Signed)
  Subjective:    Patient ID: Mark [name], male    DOB: 1956/01/11, 60 y.o.   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 He suspected a spider bite , but did not actually see a spider and reports the area startted with a fairly acute onset of a sharp stinging pain.   Medication Sig  . aspirin 81 MG  Chew by mouth daily.  . bisoprolol-hctz (ZIAC) 5-6.25  TAKE ONE TABLET BY MOUTH ONCE DAILY FOR  BLOOD  PRESSURE  . ezetimibe  10 MG  Take 1 tablet (10 mg total) by mouth daily.  . metFORMIN -XR 500 MG  TAKE TWO TABLETS BY MOUTH TWICE DAILY  . ranitidine300 MG tablet TAKE ONE TABLET BY MOUTH ONCE DAILY FOR ACID REFLUX  . testosterone cypionate  200 MG/ML inj Inject 2 cc IM every 2 weeks or as directed.   Allergies  Allergen Reactions  . Codeine     REACTION: Anxiety  . Sertraline Swelling   Past Medical History  Diagnosis Date  . Hyperlipidemia   . Hypertension   . Prediabetes   . GERD (gastroesophageal reflux disease)   . Vitamin D deficiency   . Other testicular hypofunction    Review of Systems  10 point systems review negative except as above.    Objective:   Physical Exam  BP 136/82 mmHg  Pulse 76  Temp(Src) 97.9 F (36.6 C)  Resp 16  Ht 6' 0.5" (1.842 m)  Wt 231 lb 9.6 oz (105.053 kg)  BMI 30.96 kg/m2  Focused exam finds a tender indurated area approx 2" x 4" of the mid L buttock area with a tiny 3-4 mm punctate central eschar. No fluctuance or lymphangitic streaking.     Assessment & Plan:   1. Spider bite, accidental or unintentional, initial encounter  - predniSONE (DELTASONE) 20 MG tablet; 1 tab 3 x day for 3 days, then 1 tab 2 x day for 3 days, then 1 tab 1 x day for 5 days  Dispense: 20 tablet; Refill: 0  - HYDROcodone-acetaminophen (NORCO) 5-325 MG tablet; Take 1 tablet every 3-4 hours if needed for pain   Dispense: 30 tablet; Refill: 0  - doxycycline (VIBRAMYCIN) 100 MG capsule; Take 2 capsules at once on a full stomach, then take 1 capsule  2 x day on a full stomach for infection  Dispense: 22 capsule; Refill: 0  2. Hypogonadism in male  - testosterone cypionate (DEPOTESTOSTERONE CYPIONATE) injection 400 mg; Inject 2 mLs (400 mg total) into the muscle once.

## 2015-05-15 NOTE — Patient Instructions (Addendum)

## 2015-05-15 NOTE — Progress Notes (Signed)
Patient ID: Mark [name], male   DOB: 08/11/1955, 60 y.o.   MRN: [id number]  C  A  N  C  E  L  E  D

## 2015-05-17 ENCOUNTER — Ambulatory Visit: Payer: Self-pay | Admitting: Internal Medicine

## 2015-05-19 ENCOUNTER — Ambulatory Visit (INDEPENDENT_AMBULATORY_CARE_PROVIDER_SITE_OTHER): Payer: BC Managed Care – PPO | Admitting: *Deleted

## 2015-05-19 DIAGNOSIS — E291 Testicular hypofunction: Secondary | ICD-10-CM | POA: Diagnosis not present

## 2015-05-19 NOTE — Progress Notes (Signed)
Patient ID: Mark [name], male   DOB: 04/27/1955, 60 y.o.   MRN: [id number]  Patient presents for testosterone injection for hypogonadism Tx.  Patient received 2 cc IM left glute and tolerated well.

## 2015-06-10 ENCOUNTER — Ambulatory Visit (INDEPENDENT_AMBULATORY_CARE_PROVIDER_SITE_OTHER): Payer: BC Managed Care – PPO

## 2015-06-14 ENCOUNTER — Ambulatory Visit (INDEPENDENT_AMBULATORY_CARE_PROVIDER_SITE_OTHER): Payer: BC Managed Care – PPO | Admitting: Physician Assistant

## 2015-06-14 ENCOUNTER — Encounter: Payer: Self-pay | Admitting: Physician Assistant

## 2015-06-14 ENCOUNTER — Ambulatory Visit: Payer: Self-pay | Admitting: Internal Medicine

## 2015-06-14 VITALS — BP 140/80 | HR 61 | Temp 97.5°F | Resp 16 | Ht 72.5 in | Wt 233.4 lb

## 2015-06-14 DIAGNOSIS — N400 Enlarged prostate without lower urinary tract symptoms: Secondary | ICD-10-CM

## 2015-06-14 DIAGNOSIS — R319 Hematuria, unspecified: Secondary | ICD-10-CM | POA: Diagnosis not present

## 2015-06-14 DIAGNOSIS — R350 Frequency of micturition: Secondary | ICD-10-CM

## 2015-06-14 LAB — BASIC METABOLIC PANEL WITH GFR
BUN: 16 mg/dL (ref 7–25)
CALCIUM: 8.7 mg/dL (ref 8.6–10.3)
CHLORIDE: 103 mmol/L (ref 98–110)
CO2: 27 mmol/L (ref 20–31)
CREATININE: 1.1 mg/dL (ref 0.70–1.25)
GFR, Est African American: 84 mL/min (ref >=60)
GFR, Est Non African American: 73 mL/min (ref >=60)
GLUCOSE: 131 mg/dL — AB (ref 65–99)
Potassium: 4 mmol/L (ref 3.5–5.3)
SODIUM: 139 mmol/L (ref 135–146)

## 2015-06-14 LAB — CBC WITH DIFFERENTIAL/PLATELET
Basophils Absolute: 0.1 10*3/uL (ref 0.0–0.1)
Basophils Relative: 1 % (ref 0–1)
Eosinophils Absolute: 0.1 10*3/uL (ref 0.0–0.7)
Eosinophils Relative: 2 % (ref 0–5)
HEMATOCRIT: 44.8 % (ref 39.0–52.0)
HEMOGLOBIN: 15.3 g/dL (ref 13.0–17.0)
LYMPHS ABS: 2.6 10*3/uL (ref 0.7–4.0)
Lymphocytes Relative: 36 % (ref 12–46)
MCH: 30.6 pg (ref 26.0–34.0)
MCHC: 34.2 g/dL (ref 30.0–36.0)
MCV: 89.6 fL (ref 78.0–100.0)
MONO ABS: 0.4 10*3/uL (ref 0.1–1.0)
MONOS PCT: 6 % (ref 3–12)
MPV: 10.5 fL (ref 8.6–12.4)
NEUTROS ABS: 4 10*3/uL (ref 1.7–7.7)
NEUTROS PCT: 55 % (ref 43–77)
Platelets: 250 10*3/uL (ref 150–400)
RBC: 5 MIL/uL (ref 4.22–5.81)
RDW: 13.8 % (ref 11.5–15.5)
WBC: 7.3 10*3/uL (ref 4.0–10.5)

## 2015-06-14 MED ORDER — SULFAMETHOXAZOLE-TRIMETHOPRIM 800-160 MG PO TABS: 1.0000 | ORAL_TABLET | Freq: Two times a day (BID) | ORAL | Status: DC

## 2015-06-14 MED ORDER — DOXAZOSIN MESYLATE 4 MG PO TABS: 4.0000 mg | ORAL_TABLET | Freq: Every day | ORAL | Status: DC

## 2015-06-14 NOTE — Progress Notes (Addendum)
Subjective:    Patient ID: Mark [name], male    DOB: 01-28-1956, 60 y.o.   MRN: [id number]  HPI 60 y.o. WM has been having urinary issues x 1 week. Has had decreased urination with urgency/frequency, weak stream, dribbling, he denies fever/chills but has had nausea, lower back pain and suprapubic pain. Denies any new sexualy partners.   Lab Results  Component Value Date   PSA 1.77 07/27/2014   PSA 1.53 04/23/2013    Blood pressure 140/80, pulse 61, temperature 97.5 F (36.4 C), temperature source Temporal, resp. rate 16, height 6' 0.5" (1.842 m), weight 233 lb 6.4 oz (105.87 kg), SpO2 97 %.  Past Medical History  Diagnosis Date  . Hyperlipidemia   . Hypertension   . Prediabetes   . GERD (gastroesophageal reflux disease)   . Vitamin D deficiency   . Other testicular hypofunction    Current Outpatient Prescriptions on File Prior to Visit  Medication Sig Dispense Refill  . aspirin 81 MG chewable tablet Chew by mouth daily.    . bisoprolol-hydrochlorothiazide (ZIAC) 5-6.25 MG tablet TAKE ONE TABLET BY MOUTH ONCE DAILY FOR  BLOOD  PRESSURE 90 tablet 0  . doxycycline (VIBRAMYCIN) 100 MG capsule Take 2 capsules at once on a full stomach, then take 1 capsule  2 x day on a full stomach for infection 22 capsule 0  . ezetimibe (ZETIA) 10 MG tablet Take 1 tablet (10 mg total) by mouth daily. 30 tablet 2  . metFORMIN (GLUCOPHAGE-XR) 500 MG 24 hr tablet TAKE TWO TABLETS BY MOUTH TWICE DAILY 120 tablet 0  . ranitidine (ZANTAC) 300 MG tablet TAKE ONE TABLET BY MOUTH ONCE DAILY FOR ACID REFLUX 90 tablet 1  . testosterone cypionate (DEPOTESTOSTERONE CYPIONATE) 200 MG/ML injection Inject 2 cc IM every 2 weeks or as directed. 10 mL 1   Current Facility-Administered Medications on File Prior to Visit  Medication Dose Route Frequency Provider Last Rate Last Dose  . testosterone cypionate (DEPOTESTOSTERONE CYPIONATE) injection 400 mg  400 mg Intramuscular Q14 Days Lucky CowboyWilliam McKeown, MD   400 mg at  05/19/15 [id number]    Review of Systems  Constitutional: Negative.   HENT: Negative.   Respiratory: Negative.   Cardiovascular: Negative.   Gastrointestinal: Positive for nausea and abdominal pain. Negative for vomiting, diarrhea, constipation, blood in stool, abdominal distention, anal bleeding and rectal pain.  Genitourinary: Positive for urgency, frequency, decreased urine volume and difficulty urinating. Negative for dysuria, hematuria, flank pain, discharge, penile swelling, scrotal swelling, enuresis, genital sores, penile pain and testicular pain.  Musculoskeletal: Positive for back pain (lower back). Negative for myalgias, joint swelling, arthralgias, gait problem, neck pain and neck stiffness.       Objective:   Physical Exam  Constitutional: He is oriented to person, place, and time. He appears well-developed and well-nourished.  Cardiovascular: Normal rate, regular rhythm and normal heart sounds.   Pulmonary/Chest: Effort normal and breath sounds normal.  Abdominal: Soft. Bowel sounds are normal. There is no tenderness.  Genitourinary: Rectal exam shows no mass. Guaiac negative stool. Prostate is enlarged. Prostate is not tender.  Musculoskeletal: Normal range of motion.  Neurological: He is alert and oriented to person, place, and time. No cranial nerve deficit.  Skin: Skin is warm and dry.  Psychiatric: He has a normal mood and affect. His behavior is normal.       Assessment & Plan:  1. Urinary frequency - sulfamethoxazole-trimethoprim (BACTRIM DS,SEPTRA DS) 800-160 MG tablet; Take 1 tablet by mouth 2 (  two) times daily.  Dispense: 20 tablet; Refill: 0 - CBC with Differential/Platelet - BASIC METABOLIC PANEL WITH GFR - Urinalysis, Routine w reflex microscopic (not at [facility]) - Urine culture  2. BPH without obstruction/lower urinary tract symptoms - doxazosin (CARDURA) 4 MG tablet; Take 1 tablet (4 mg total) by mouth daily.  Dispense: 30 tablet; Refill:  11   Addendum: Pending urine culture of urine shows calcium oxalate crystals and hematuria, will refer to urology

## 2015-06-14 NOTE — Patient Instructions (Addendum)
Start 1/4 of the doxazosin at night before bed for 4 nights, then do 1/2 of the doxazosin at night for 1 week. Stay on the doxazosin or increase to 1 pill. If you get dizzy with the doxazosin go back to the previous dose. This medication can cause hypotension so be careful with standing in the beginning and decrease the dose if you get dizzy. Please drink plenty of fluids.   If you are unable to void for more than 4-6 hours than go to the ER.   Benign Prostatic Hypertrophy The prostate gland is part of the reproductive system of men. A normal prostate is about the size and shape of a walnut. The prostate gland produces a fluid that is mixed with sperm to make semen. This gland surrounds the urethra and is located in front of the rectum and just below the bladder. The bladder is where urine is stored. The urethra is the tube through which urine passes from the bladder to get out of the body. The prostate grows as a man ages. An enlarged prostate not caused by cancer is called benign prostatic hypertrophy (BPH). An enlarged prostate can press on the urethra. This can make it harder to pass urine. In the early stages of enlargement, the bladder can get by with a narrowed urethra by forcing the urine through. If the problem gets worse, medical or surgical treatment may be required.  This condition should be followed by your health care provider. The accumulation of urine in the bladder can cause infection. Back pressure and infection can progress to bladder damage and kidney (renal) failure. If needed, your health care provider may refer you to a specialist in kidney and prostate disease (urologist). CAUSES  BPH is a common health problem in men older than 50 years. This condition is a normal part of aging. However, not all men will develop problems from this condition. If the enlargement grows away from the urethra, then there will not be any compression of the urethra and resistance to urine flow.If the  growth is toward the urethra and compresses it, you will experience difficulty urinating.  SYMPTOMS   Not able to completely empty your bladder.  Getting up often during the night to urinate.  Need to urinate frequently during the day.  Difficultly starting urine flow.  Decrease in size and strength of your urine stream.  Dribbling after urination.  Pain on urination (more common with infection).  Inability to pass urine. This needs immediate treatment.  The development of a urinary tract infection. DIAGNOSIS  These tests will help your health care provider understand your problem:  A thorough history and physical examination.  A urination history, with the number of times you urinate, the amounts of urine, the strength of the urine stream, and the feeling of emptiness or fullness after urinating.  A postvoid bladder scan that measures any amount of urine that may remain in your bladder after you finish urinating.  Digital rectal exam. In a rectal exam, your health care provider checks your prostate by putting a gloved, lubricated finger into your rectum to feel the back of your prostate gland. This exam detects the size of your gland and abnormal lumps or growths.  Exam of your urine (urinalysis).  Prostate specific antigen (PSA) screening. This is a blood test used to screen for prostate cancer.  Rectal ultrasonography. This test uses sound waves to electronically produce a picture of your prostate gland. TREATMENT  Once symptoms begin, your health care  provider will monitor your condition. Of the men with this condition, one third will have symptoms that stabilize, one third will have symptoms that improve, and one third will have symptoms that progress in the first year. Mild symptoms may not need treatment. Simple observation and yearly exams may be all that is required. Medicines and surgery are options for more severe problems. Your health care provider can help you make  an informed decision for what is best. Two classes of medicines are available for relief of prostate symptoms:  Medicines that shrink the prostate. This helps relieve symptoms. These medicines take time to work, and it may be months before any improvement is seen.  Uncommon side effects include problems with sexual function.  Medicines to relax the muscle of the prostate. This also relieves the obstruction by reducing any compression on the urethra.This group of medicines work much faster than those that reduce the size of the prostate gland. Usually, one can experience improvement in days to weeks..  Side effects can include dizziness, fatigue, lightheadedness, and retrograde ejaculation (diminished volume of ejaculate). Several types of surgical treatments are available for relief of prostate symptoms:  Transurethral resection of the prostate (TURP)--In this treatment, an instrument is inserted through opening at the tip of the penis. It is used to cut away pieces of the inner core of the prostate. The pieces are removed through the same opening of the penis. This removes the obstruction and helps get rid of the symptoms.  Transurethral incision (TUIP)--In this procedure, small cuts are made in the prostate. This lessens the prostates pressure on the urethra.  Transurethral microwave thermotherapy (TUMT)--This procedure uses microwaves to create heat. The heat destroys and removes a small amount of prostate tissue.  Transurethral needle ablation (TUNA)--This is a procedure that uses radio frequencies to do the same as TUMT.  Interstitial laser coagulation (ILC)--This is a procedure that uses a laser to do the same as TUMT and TUNA.  Transurethral electrovaporization (TUVP)--This is a procedure that uses electrodes to do the same as the procedures listed above. SEEK MEDICAL CARE IF:   You develop a fever.  There is unexplained back pain.  Symptoms are not helped by medicines  prescribed.  You develop side effects from the medicine you are taking.  Your urine becomes very dark or has a bad smell.  Your lower abdomen becomes distended and you have difficulty passing your urine. SEEK IMMEDIATE MEDICAL CARE IF:   You are suddenly unable to urinate. This is an emergency. You should be seen immediately.  There are large amounts of blood or clots in the urine.  Your urinary problems become unmanageable.  You develop lightheadedness, severe dizziness, or you feel faint.  You develop moderate to severe low back or flank pain.  You develop chills or fever.   This information is not intended to replace advice given to you by your health care provider. Make sure you discuss any questions you have with your health care provider.   Document Released: 03/13/2005 Document Revised: 03/18/2013 Document Reviewed: 09/26/2012 Elsevier Interactive Patient Education [employer]2016 [employer]

## 2015-06-15 LAB — URINALYSIS, MICROSCOPIC ONLY
BACTERIA UA: NONE SEEN [HPF]
Casts: NONE SEEN [LPF]
RBC / HPF: NONE SEEN RBC/HPF (ref <=2)
SQUAMOUS EPITHELIAL / LPF: NONE SEEN [HPF] (ref <=5)
WBC, UA: NONE SEEN WBC/HPF (ref <=5)
YEAST: NONE SEEN [HPF]

## 2015-06-15 LAB — URINALYSIS, ROUTINE W REFLEX MICROSCOPIC
Bilirubin Urine: NEGATIVE
Ketones, ur: NEGATIVE
LEUKOCYTES UA: NEGATIVE
NITRITE: NEGATIVE
PH: 6 (ref 5.0–8.0)
PROTEIN: NEGATIVE
Specific Gravity, Urine: 1.028 (ref 1.001–1.035)

## 2015-06-15 LAB — URINE CULTURE: Colony Count: 1000

## 2015-06-15 NOTE — Addendum Note (Signed)
Addended by: Quentin MullingOLLIER, AMANDA R on: 06/15/2015 02:35 PM   Modules accepted: Orders

## 2015-07-01 ENCOUNTER — Ambulatory Visit (INDEPENDENT_AMBULATORY_CARE_PROVIDER_SITE_OTHER): Payer: BC Managed Care – PPO | Admitting: *Deleted

## 2015-07-01 DIAGNOSIS — E291 Testicular hypofunction: Secondary | ICD-10-CM | POA: Diagnosis not present

## 2015-07-01 NOTE — Progress Notes (Signed)
Patient ID: Mark [name], male   DOB: 09/02/1955, 60 y.o.   MRN: [id number] Patient presents for testosterone injection for hypogonadism Tx.  Patient received 2 cc IM right glute and tolerated well.

## 2015-07-28 ENCOUNTER — Encounter: Payer: Self-pay | Admitting: Physician Assistant

## 2015-07-31 ENCOUNTER — Other Ambulatory Visit: Payer: Self-pay | Admitting: Internal Medicine

## 2015-08-02 NOTE — Telephone Encounter (Signed)
Rx called into [facility]

## 2015-08-04 ENCOUNTER — Ambulatory Visit (INDEPENDENT_AMBULATORY_CARE_PROVIDER_SITE_OTHER): Payer: BC Managed Care – PPO | Admitting: Internal Medicine

## 2015-08-04 ENCOUNTER — Encounter: Payer: Self-pay | Admitting: Internal Medicine

## 2015-08-04 VITALS — BP 130/64 | HR 74 | Temp 97.7°F | Resp 16 | Ht 72.5 in | Wt 230.6 lb

## 2015-08-04 DIAGNOSIS — L0231 Cutaneous abscess of buttock: Secondary | ICD-10-CM | POA: Diagnosis not present

## 2015-08-04 MED ORDER — SULFAMETHOXAZOLE-TRIMETHOPRIM 800-160 MG PO TABS: 1.0000 | ORAL_TABLET | Freq: Two times a day (BID) | ORAL | Status: DC

## 2015-08-04 NOTE — Patient Instructions (Signed)

## 2015-08-04 NOTE — Progress Notes (Signed)
   Subjective:    Patient ID: Jaye BeagleMichael J Seibold, male    DOB: 02-25-1956, 60 y.o.   MRN: [id number]  HPI  Patient presents to the office for evalaution of a buttock wound that has been there off and on for the last 1.5 months.  He reports that he has been putting on various over the counter creams and rubbing alcohol with no relief.   He has a history of abscess 2 months ago which was treated with doxycycline.  He has been using heating pads and also using epsom salt baths.  He has also been picking at it.   Review of Systems  Constitutional: Negative for fever, chills and fatigue.  Gastrointestinal: Negative for nausea and vomiting.  Skin: Positive for color change, rash and wound.       Objective:   Physical Exam  Constitutional: He appears well-developed and well-nourished.  HENT:  Head: Normocephalic and atraumatic.  Eyes: Conjunctivae are normal.  Neck: Normal range of motion. Neck supple.  Cardiovascular: Normal rate, regular rhythm, normal heart sounds and intact distal pulses.   Pulmonary/Chest: Effort normal and breath sounds normal. No respiratory distress. He has no wheezes. He has no rales. He exhibits no tenderness.  Skin:     Nursing note and vitals reviewed.   Filed Vitals:   08/04/15 1000  BP: 130/64  Pulse: 74  Temp: 97.7 F (36.5 C)  Resp: 16          Assessment & Plan:    1. Left buttock abscess -indurated -no palpable fluctuance so will avoid I&D -bactrim DS -cont warm soaks and compresses -avoid picking -patient to call back with signs of spreading cellulitis

## 2015-08-09 ENCOUNTER — Ambulatory Visit (INDEPENDENT_AMBULATORY_CARE_PROVIDER_SITE_OTHER): Payer: BC Managed Care – PPO | Admitting: Physician Assistant

## 2015-08-09 ENCOUNTER — Encounter: Payer: Self-pay | Admitting: Physician Assistant

## 2015-08-09 VITALS — BP 130/68 | HR 84 | Temp 97.5°F | Resp 16 | Ht 72.5 in | Wt 232.2 lb

## 2015-08-09 DIAGNOSIS — Z79899 Other long term (current) drug therapy: Secondary | ICD-10-CM | POA: Diagnosis not present

## 2015-08-09 DIAGNOSIS — Z23 Encounter for immunization: Secondary | ICD-10-CM | POA: Diagnosis not present

## 2015-08-09 DIAGNOSIS — Z136 Encounter for screening for cardiovascular disorders: Secondary | ICD-10-CM

## 2015-08-09 DIAGNOSIS — E559 Vitamin D deficiency, unspecified: Secondary | ICD-10-CM

## 2015-08-09 DIAGNOSIS — N182 Chronic kidney disease, stage 2 (mild): Secondary | ICD-10-CM

## 2015-08-09 DIAGNOSIS — Z0001 Encounter for general adult medical examination with abnormal findings: Secondary | ICD-10-CM | POA: Diagnosis not present

## 2015-08-09 DIAGNOSIS — R6889 Other general symptoms and signs: Secondary | ICD-10-CM | POA: Diagnosis not present

## 2015-08-09 DIAGNOSIS — I10 Essential (primary) hypertension: Secondary | ICD-10-CM | POA: Diagnosis not present

## 2015-08-09 DIAGNOSIS — Z125 Encounter for screening for malignant neoplasm of prostate: Secondary | ICD-10-CM | POA: Diagnosis not present

## 2015-08-09 DIAGNOSIS — E1122 Type 2 diabetes mellitus with diabetic chronic kidney disease: Secondary | ICD-10-CM

## 2015-08-09 DIAGNOSIS — E349 Endocrine disorder, unspecified: Secondary | ICD-10-CM

## 2015-08-09 DIAGNOSIS — E291 Testicular hypofunction: Secondary | ICD-10-CM | POA: Diagnosis not present

## 2015-08-09 DIAGNOSIS — K219 Gastro-esophageal reflux disease without esophagitis: Secondary | ICD-10-CM

## 2015-08-09 DIAGNOSIS — E785 Hyperlipidemia, unspecified: Secondary | ICD-10-CM

## 2015-08-09 DIAGNOSIS — N400 Enlarged prostate without lower urinary tract symptoms: Secondary | ICD-10-CM

## 2015-08-09 LAB — CBC WITH DIFFERENTIAL/PLATELET
BASOS ABS: 60 {cells}/uL (ref 0–200)
Basophils Relative: 1 %
EOS ABS: 180 {cells}/uL (ref 15–500)
Eosinophils Relative: 3 %
HEMATOCRIT: 45.1 % (ref 38.5–50.0)
HEMOGLOBIN: 15.1 g/dL (ref 13.2–17.1)
LYMPHS ABS: 2280 {cells}/uL (ref 850–3900)
Lymphocytes Relative: 38 %
MCH: 29.6 pg (ref 27.0–33.0)
MCHC: 33.5 g/dL (ref 32.0–36.0)
MCV: 88.4 fL (ref 80.0–100.0)
MONO ABS: 480 {cells}/uL (ref 200–950)
MPV: 10.4 fL (ref 7.5–12.5)
Monocytes Relative: 8 %
NEUTROS PCT: 50 %
Neutro Abs: 3000 cells/uL (ref 1500–7800)
Platelets: 243 10*3/uL (ref 140–400)
RBC: 5.1 MIL/uL (ref 4.20–5.80)
RDW: 12.6 % (ref 11.0–15.0)
WBC: 6 10*3/uL (ref 3.8–10.8)

## 2015-08-09 LAB — HEPATIC FUNCTION PANEL
ALK PHOS: 98 U/L (ref 40–115)
ALT: 27 U/L (ref 9–46)
AST: 20 U/L (ref 10–35)
Albumin: 4.1 g/dL (ref 3.6–5.1)
Bilirubin, Direct: <0.1 mg/dL (ref <=0.2)
TOTAL PROTEIN: 6.7 g/dL (ref 6.1–8.1)
Total Bilirubin: 0.2 mg/dL (ref 0.2–1.2)

## 2015-08-09 LAB — BASIC METABOLIC PANEL WITH GFR
BUN: 18 mg/dL (ref 7–25)
CALCIUM: 9.1 mg/dL (ref 8.6–10.3)
CO2: 26 mmol/L (ref 20–31)
Chloride: 99 mmol/L (ref 98–110)
Creat: 1.21 mg/dL (ref 0.70–1.25)
GFR, EST NON AFRICAN AMERICAN: 65 mL/min (ref >=60)
GFR, Est African American: 75 mL/min (ref >=60)
GLUCOSE: 215 mg/dL — AB (ref 65–99)
Potassium: 4.4 mmol/L (ref 3.5–5.3)
Sodium: 136 mmol/L (ref 135–146)

## 2015-08-09 LAB — TESTOSTERONE: Testosterone: 162 ng/dL — ABNORMAL LOW (ref 250–827)

## 2015-08-09 LAB — MAGNESIUM: Magnesium: 2.2 mg/dL (ref 1.5–2.5)

## 2015-08-09 LAB — LIPID PANEL
CHOL/HDL RATIO: 6.8 ratio — AB (ref <=5.0)
CHOLESTEROL: 204 mg/dL — AB (ref 125–200)
HDL: 30 mg/dL — AB (ref >=40)
TRIGLYCERIDES: 455 mg/dL — AB (ref <150)

## 2015-08-09 LAB — TSH: TSH: 1.87 mIU/L (ref 0.40–4.50)

## 2015-08-09 LAB — URIC ACID: Uric Acid, Serum: 4.5 mg/dL (ref 4.0–7.8)

## 2015-08-09 MED ORDER — DOXAZOSIN MESYLATE 1 MG PO TABS: 1.0000 mg | ORAL_TABLET | Freq: Every day | ORAL | Status: DC

## 2015-08-09 NOTE — Progress Notes (Signed)
Complete Physical  Assessment and Plan: 1. Male hypogonadism He would like to try to switch to testim, printed out RX for him - testosterone cypionate (DEPOTESTOTERONE CYPIONATE) injection 400 mg; Inject 2 mLs (400 mg total) into the muscle once. - Testosterone  2. Essential hypertension - continue medications, DASH diet, exercise and monitor at home. Call if greater than 130/80.  - CBC with Differential/Platelet - Hepatic function panel - TSH - Urinalysis, Routine w reflex microscopic - Microalbumin / creatinine urine ratio - EKG 12-Lead - will switch from ziac to doxazosin  3. Hyperlipidemia -continue medications, check lipids, decrease fatty foods, increase activity.  Not at goal, goal is LDL less than 70 - Lipid panel  4. CKD stage 2 due to type 2 diabetes mellitus Discussed general issues about diabetes pathophysiology and management., Educational material distributed., Suggested low cholesterol diet., Encouraged aerobic exercise., Discussed foot care., Reminded to get yearly retinal exam. - BASIC METABOLIC PANEL WITH GFR - Hemoglobin A1c - Insulin, fasting - HM DIABETES FOOT EXAM  5. Obesity Obesity with co morbidities- long discussion about weight loss, diet, and exercise  6. Gastroesophageal reflux disease without esophagitis Continue PPI/H2 blocker, diet discussed  7. Vitamin D deficiency - Vit D  25 hydroxy (rtn osteoporosis monitoring)  8. Medication management - Magnesium  9. Prostate cancer screening - PSA  10. BPH - continue doxazosin 1/4-1/2 or 1-2mg   Discussed med's effects and SE's. Screening labs and tests as requested with regular follow-up as recommended. Moderate- high complexity decision making was done during the course of this visit.   HPI Patient presents for complete physical.   Patient's blood pressure has been controlled at home. Patient denies chest pain, shortness of breath, dizziness. BP: 130/68 mmHg  Patient's cholesterol is diet  controlled. Off zetia and pravachol, wants to get back on it. The patient's cholesterol last visit was: Lab Results  Component Value Date   CHOL 153 03/23/2015   HDL 33* 03/23/2015   LDLCALC 81 03/23/2015   TRIG 193* 03/23/2015   CHOLHDL 4.6 03/23/2015  CPK was 244.  Lab Results  Component Value Date   CKTOTAL 101 03/23/2015  He has a history of testosterone deficiency and is on testosterone replacement. He states that the testosterone helps with his energy, libido, muscle mass. Lab Results  Component Value Date   TESTOSTERONE 202* 11/13/2014  He is on supplement, 1000 IU Last Vitamin D was Lab Results  Component Value Date   VD25OH 22* 11/13/2014   He has been working on diet and exercise for Diabetes with diabetic chronic kidney disease stage 2 (GFR 76 and 83), his A1C has been in the DM range for the last year, he is on metformin he is on bASA, he is not on ACE/ARB, and denies paresthesia of the feet, polydipsia, polyuria and visual disturbances. Last A1C was:  Lab Results  Component Value Date   HGBA1C 7.0* 03/23/2015   He has frequent urination, started on doxazosin and states it is helping considerably, declines seeing urology.  BMI is Body mass index is 31.04 kg/(m^2)., he is working on diet and exercise. Wt Readings from Last 3 Encounters:  08/09/15 232 lb 3.2 oz (105.325 kg)  08/04/15 230 lb 9.6 oz (104.599 kg)  06/14/15 233 lb 6.4 oz (105.87 kg)    Current Medications:  Current Outpatient Prescriptions on File Prior to Visit  Medication Sig Dispense Refill  . aspirin 81 MG chewable tablet Chew by mouth daily.    . bisoprolol-hydrochlorothiazide [facility]) 5-6.25  MG tablet TAKE ONE TABLET BY MOUTH ONCE DAILY FOR  BLOOD  PRESSURE 90 tablet 0  . doxazosin (CARDURA) 4 MG tablet Take 1 tablet (4 mg total) by mouth daily. 30 tablet 11  . ezetimibe (ZETIA) 10 MG tablet Take 1 tablet (10 mg total) by mouth daily. 30 tablet 2  . metFORMIN (GLUCOPHAGE-XR) 500 MG 24 hr tablet TAKE  TWO TABLETS BY MOUTH TWICE DAILY 120 tablet 0  . ranitidine (ZANTAC) 300 MG tablet TAKE ONE TABLET BY MOUTH ONCE DAILY FOR ACID REFLUX 90 tablet 1  . sulfamethoxazole-trimethoprim (BACTRIM DS,SEPTRA DS) 800-160 MG tablet Take 1 tablet by mouth 2 (two) times daily. 14 tablet 0  . testosterone cypionate (DEPOTESTOSTERONE CYPIONATE) 200 MG/ML injection INJECT 2 ML INTRAMUSCULARLY EVERY 2 WEEKS OR AS DIRECTED 10 mL 0   Current Facility-Administered Medications on File Prior to Visit  Medication Dose Route Frequency Provider Last Rate Last Dose  . testosterone cypionate (DEPOTESTOSTERONE CYPIONATE) injection 400 mg  400 mg Intramuscular Q14 Days Lucky CowboyWilliam McKeown, MD   400 mg at 07/01/15 1551   Health Maintenance:   Immunization History  Administered Date(s) Administered  . Td 03/27/2005   Tetanus: 2007 DUE this year Pneumovax: 2013 Prevnar 13: due age 60 Flu vaccine: N/A Zostavax:N/A DEXA: N/A Colonoscopy: 10/2009 due 10 years EGD:N/A Ct chest 2005 CXR 2007 Stress test 08/2005  Allergies:  Allergies  Allergen Reactions  . Codeine     REACTION: Anxiety  . Sertraline Swelling   Medical History:  Past Medical History  Diagnosis Date  . Hyperlipidemia   . Hypertension   . Prediabetes   . GERD (gastroesophageal reflux disease)   . Vitamin D deficiency   . Other testicular hypofunction    Surgical History:  Past Surgical History  Procedure Laterality Date  . Vasectomy  01/2004  . Urethral dilation  2007   Family History:  Family History  Problem Relation Age of Onset  . Hypertension Mother   . Hyperlipidemia Mother   . COPD Father   . Heart disease Father   . Hypertension Father    Social History:  Social History   Social History  . Marital Status: Married    Spouse Name: N/A  . Number of Children: N/A  . Years of Education: N/A   Occupational History  . Not on file.   Social History Main Topics  . Smoking status: Never Smoker   . Smokeless tobacco: Not on  file  . Alcohol Use: No  . Drug Use: No  . Sexual Activity: Not on file   Other Topics Concern  . Not on file   Social History Narrative   Review of Systems  Constitutional: Negative.   HENT: Negative for congestion, ear discharge, ear pain, hearing loss, nosebleeds, sore throat and tinnitus.   Eyes: Negative for blurred vision, double vision, photophobia, pain, discharge and redness.  Respiratory: Negative.  Negative for stridor.   Cardiovascular: Negative.  Negative for chest pain.  Gastrointestinal: Negative.   Genitourinary: Negative for dysuria, urgency, frequency, hematuria and flank pain.  Musculoskeletal: Negative.   Skin: Negative.   Neurological: Negative.  Negative for headaches.  Psychiatric/Behavioral: Negative.  Negative for depression. The patient is not nervous/anxious.    Physical Exam: Estimated body mass index is 31.04 kg/(m^2) as calculated from the following:   Height as of this encounter: 6' 0.5" (1.842 m).   Weight as of this encounter: 232 lb 3.2 oz (105.325 kg). Filed Vitals:   08/09/15 1516  BP: 130/68  Pulse: 84  Temp: 97.5 F (36.4 C)  Resp: 16   General Appearance: Well nourished, in no apparent distress. Eyes: PERRLA, EOMs, conjunctiva no swelling or erythema, normal fundi and vessels. Sinuses: No Frontal/maxillary tenderness ENT/Mouth: Ext aud canals clear, normal light reflex with TMs without erythema, bulging. Good dentition. No erythema, swelling, or exudate on post pharynx. Tonsils not swollen or erythematous. Hearing normal.  Neck: Supple, thyroid normal. No bruits Respiratory: Respiratory effort normal, BS equal bilaterally without rales, rhonchi, wheezing or stridor. Cardio: RRR without murmurs, rubs or gallops. Brisk peripheral pulses without edema.  Chest: symmetric, with normal excursions and percussion. Abdomen: Soft, +BS. Non tender, no guarding, rebound, hernias, masses, or organomegaly. .  Lymphatics: Non tender without  lymphadenopathy.  Genitourinary: defer did last visit, enlarged.  Musculoskeletal: Full ROM all peripheral extremities,5/5 strength, and normal gait. Skin: Warm, dry without rashes, lesions, ecchymosis.  Neuro: Cranial nerves intact, reflexes equal bilaterally. Normal muscle tone, no cerebellar symptoms. Sensation intact.  Psych: Awake and oriented X 3, normal affect, Insight and Judgment appropriate.   EKG: WNL no changes. Aorta scan: WNL  Quentin Mulling 3:18 PM

## 2015-08-09 NOTE — Patient Instructions (Addendum)
Stop the ziac  Do the 1 mg of doxazosin  Diabetes is a very complicated disease...lets simplify it.  An easy way to look at it to understand the complications is if you think of the extra sugar floating in your blood stream as glass shards floating through your blood stream.    Diabetes affects your small vessels first: 1) The glass shards (sugar) scraps down the tiny blood vessels in your eyes and lead to diabetic retinopathy, the leading cause of blindness in the [place]. Diabetes is the leading cause of newly diagnosed adult (7520 to 61 years of age) blindness in the [place].  2) The glass shards scratches down the tiny vessels of your legs leading to nerve damage called neuropathy and can lead to amputations of your feet. More than 60% of all non-traumatic amputations of lower limbs occur in people with diabetes.  3) Over time the small vessels in your brain are shredded and closed off, individually this does not cause any problems but over a long period of time many of the small vessels being blocked can lead to Vascular Dementia.   4) Your kidney's are a filter system and have a "net" that keeps certain things in the body and lets bad things out. Sugar shreds this net and leads to kidney damage and eventually failure. Decreasing the sugar that is destroying the net and certain blood pressure medications can help stop or decrease progression of kidney disease. Diabetes was the primary cause of kidney failure in 44 percent of all new cases in 2011.  5) Diabetes also destroys the small vessels in your penis that lead to erectile dysfunction. Eventually the vessels are so damaged that you may not be responsive to cialis or viagra.   Diabetes and your large vessels: Your larger vessels consist of your coronary arteries in your heart and the carotid vessels to your brain. Diabetes or even increased sugars put you at 300% increased risk of heart attack and stroke and this is why.. The sugar scrapes  down your large blood vessels and your body sees this as an internal injury and tries to repair itself. Just like you get a scab on your skin, your platelets will stick to the blood vessel wall trying to heal it. This is why we have diabetics on low dose aspirin daily, this prevents the platelets from sticking and can prevent plaque formation. In addition, your body takes cholesterol and tries to shove it into the open wound. This is why we want your LDL, or bad cholesterol, below 70.   The combination of platelets and cholesterol over 5-10 years forms plaque that can break off and cause a heart attack or stroke.   PLEASE REMEMBER:  Diabetes is preventable! Up to 85 percent of complications and morbidities among individuals with type 2 diabetes can be prevented, delayed, or effectively treated and minimized with regular visits to a health professional, appropriate monitoring and medication, and a healthy diet and lifestyle.   Your A1C is a measure of your sugar over the past 3 months and is not affected by what you have eaten over the past few days. Diabetes increases your chances of stroke and heart attack over 300 % and is the leading cause of blindness and kidney failure in the [place]. Please make sure you decrease bad carbs like white bread, white rice, potatoes, corn, soft drinks, pasta, cereals, refined sugars, sweet tea, dried fruits, and fruit juice. Good carbs are okay to eat in moderation  like sweet potatoes, brown rice, whole grain pasta/bread, most fruit (except dried fruit) and you can eat as many veggies as you want.   Greater than 6.5 is considered diabetic. Between 6.4 and 5.7 is prediabetic If your A1C is less than 5.7 you are NOT diabetic.  Targets for Glucose Readings: Time of Check Target for patients WITHOUT Diabetes Target for DIABETICS  Before Meals Less than 100  less than 150  Two hours after meals Less than 200  Less than 250

## 2015-08-10 LAB — VITAMIN D 25 HYDROXY (VIT D DEFICIENCY, FRACTURES): VIT D 25 HYDROXY: 19 ng/mL — AB (ref 30–100)

## 2015-08-10 LAB — PSA: PSA: 1.51 ng/mL (ref <=4.00)

## 2015-08-10 LAB — URINALYSIS, ROUTINE W REFLEX MICROSCOPIC
Bilirubin Urine: NEGATIVE
Hgb urine dipstick: NEGATIVE
Ketones, ur: NEGATIVE
LEUKOCYTES UA: NEGATIVE
Nitrite: NEGATIVE
PROTEIN: NEGATIVE
Specific Gravity, Urine: 1.024 (ref 1.001–1.035)
pH: 5.5 (ref 5.0–8.0)

## 2015-08-10 LAB — URINALYSIS, MICROSCOPIC ONLY
BACTERIA UA: NONE SEEN [HPF]
CASTS: NONE SEEN [LPF]
CRYSTALS: NONE SEEN [HPF]
RBC / HPF: NONE SEEN RBC/HPF (ref <=2)
WBC, UA: NONE SEEN WBC/HPF (ref <=5)
Yeast: NONE SEEN [HPF]

## 2015-08-10 LAB — MICROALBUMIN / CREATININE URINE RATIO
Creatinine, Urine: 114 mg/dL (ref 20–370)
MICROALB UR: 0.3 mg/dL
MICROALB/CREAT RATIO: 3 ug/mg{creat} (ref <30)

## 2015-08-10 LAB — HEMOGLOBIN A1C
Hgb A1c MFr Bld: 7.4 % — ABNORMAL HIGH (ref <5.7)
Mean Plasma Glucose: 166 mg/dL

## 2015-08-10 MED ORDER — PRAVASTATIN SODIUM 20 MG PO TABS: 20.0000 mg | ORAL_TABLET | Freq: Every evening | ORAL | Status: DC

## 2015-08-10 NOTE — Addendum Note (Signed)
Addended by: Quentin MullingOLLIER, AMANDA R on: 08/10/2015 07:56 AM   Modules accepted: Orders, SmartSet

## 2015-08-12 ENCOUNTER — Ambulatory Visit (INDEPENDENT_AMBULATORY_CARE_PROVIDER_SITE_OTHER): Payer: BC Managed Care – PPO | Admitting: *Deleted

## 2015-08-12 DIAGNOSIS — E291 Testicular hypofunction: Secondary | ICD-10-CM | POA: Diagnosis not present

## 2015-08-12 NOTE — Progress Notes (Signed)
Patient ID: Mark [name], male   DOB: 03/14/1956, 60 y.o.   MRN: [id number] Patient presents for testosterone injection for hypogonadism Tx.  Patient received 2 cc IM right glute. Patient requested we repeat on right side today.  Will RTC in 3 weeks for next injection.

## 2015-08-22 ENCOUNTER — Other Ambulatory Visit: Payer: Self-pay | Admitting: Internal Medicine

## 2015-08-24 ENCOUNTER — Ambulatory Visit (INDEPENDENT_AMBULATORY_CARE_PROVIDER_SITE_OTHER): Payer: BC Managed Care – PPO

## 2015-08-24 DIAGNOSIS — E291 Testicular hypofunction: Secondary | ICD-10-CM

## 2015-08-24 MED ORDER — TESTOSTERONE CYPIONATE 200 MG/ML IM SOLN
400.0000 mg | Freq: Once | INTRAMUSCULAR | Status: AC
  Administered 2015-08-24: 400 mg via INTRAMUSCULAR

## 2015-08-24 NOTE — Progress Notes (Signed)
Patient presents for testosterone injection for hypogonadism Tx. Patient received 2 cc IM LEFT glute.Will RTC in 3 weeks for next injection.

## 2015-09-10 ENCOUNTER — Other Ambulatory Visit: Payer: Self-pay

## 2015-09-15 ENCOUNTER — Encounter: Payer: Self-pay | Admitting: Physician Assistant

## 2015-09-15 ENCOUNTER — Other Ambulatory Visit: Payer: Self-pay

## 2015-09-15 ENCOUNTER — Ambulatory Visit (INDEPENDENT_AMBULATORY_CARE_PROVIDER_SITE_OTHER): Payer: BC Managed Care – PPO | Admitting: Physician Assistant

## 2015-09-15 ENCOUNTER — Ambulatory Visit: Payer: Self-pay | Admitting: Physician Assistant

## 2015-09-15 VITALS — BP 120/92 | HR 67 | Temp 98.4°F | Resp 16 | Ht 72.5 in | Wt 238.4 lb

## 2015-09-15 DIAGNOSIS — E785 Hyperlipidemia, unspecified: Secondary | ICD-10-CM | POA: Diagnosis not present

## 2015-09-15 DIAGNOSIS — E291 Testicular hypofunction: Secondary | ICD-10-CM | POA: Diagnosis not present

## 2015-09-15 DIAGNOSIS — Z79899 Other long term (current) drug therapy: Secondary | ICD-10-CM

## 2015-09-15 LAB — HEPATIC FUNCTION PANEL
ALBUMIN: 3.9 g/dL (ref 3.6–5.1)
ALK PHOS: 93 U/L (ref 40–115)
ALT: 21 U/L (ref 9–46)
AST: 17 U/L (ref 10–35)
Bilirubin, Direct: 0.1 mg/dL (ref <=0.2)
Indirect Bilirubin: 0.4 mg/dL (ref 0.2–1.2)
TOTAL PROTEIN: 6.6 g/dL (ref 6.1–8.1)
Total Bilirubin: 0.5 mg/dL (ref 0.2–1.2)

## 2015-09-15 LAB — CK: CK TOTAL: 132 U/L (ref 7–232)

## 2015-09-15 LAB — LIPID PANEL
Cholesterol: 162 mg/dL (ref 125–200)
HDL: 35 mg/dL — ABNORMAL LOW (ref >=40)
LDL Cholesterol: 88 mg/dL (ref <130)
Total CHOL/HDL Ratio: 4.6 Ratio (ref <=5.0)
Triglycerides: 197 mg/dL — ABNORMAL HIGH (ref <150)
VLDL: 39 mg/dL — ABNORMAL HIGH (ref <30)

## 2015-09-15 MED ORDER — TESTOSTERONE CYPIONATE 200 MG/ML IM SOLN
400.0000 mg | Freq: Once | INTRAMUSCULAR | Status: AC
  Administered 2015-09-15: 400 mg via INTRAMUSCULAR

## 2015-09-15 MED ORDER — METFORMIN HCL ER 500 MG PO TB24: 1000.0000 mg | ORAL_TABLET | Freq: Two times a day (BID) | ORAL | Status: DC

## 2015-09-15 NOTE — Patient Instructions (Signed)
Here is some information to help you keep your heart healthy: Move it! - Aim for 30 mins of activity every day. Take it slowly at first. Talk to [place] before starting any new exercise program.   Lose it.  -Body Mass Index (BMI) can indicate if you need to lose weight. A healthy range is 18.5-24.9. For a BMI calculator, go to [employer]  Waist Management -Excess abdominal fat is a risk factor for heart disease, diabetes, asthma, stroke and more. Ideal waist circumference is less than 35" for women and less than 40" for men.   Eat Right -focus on fruits, vegetables, whole grains, and meals you make yourself. Avoid foods with trans fat and high sugar/sodium content.   Snooze or Snore? - Loud snoring can be a sign of sleep apnea, a significant risk factor for high blood pressure, heart attach, stroke, and heart arrhythmias.  Kick the habit -Quit Smoking! Avoid second hand smoke. A single cigarette raises your blood pressure for 20 mins and increases the risk of heart attack and stroke for the next 24 hours.   Are Aspirin and Supplements right for you? -Add ENTERIC COATED low dose 81 mg Aspirin daily OR can do every other day if you have easy bruising to protect your heart and head. As well as to reduce risk of Colon Cancer by 20 %, Skin Cancer by 26 % , Melanoma by 46% and Pancreatic cancer by 60%  Say "No to Stress -There may be little you can do about problems that cause stress. However, techniques such as long walks, meditation, and exercise can help you manage it.   Start Now! - Make changes one at a time and set reasonable goals to increase your likelihood of success.     Cholesterol Cholesterol is a white, waxy, fat-like substance needed by your body in small amounts. The liver makes all the cholesterol you need. Cholesterol is carried from the liver by the blood through the blood vessels. Deposits of cholesterol (plaque) may build up on blood vessel walls. These make the arteries  narrower and stiffer. Cholesterol plaques increase the risk for heart attack and stroke.  You cannot feel your cholesterol level even if it is very high. The only way to know it is high is with a blood test. Once you know your cholesterol levels, you should keep a record of the test results. Work with your health care provider to keep your levels in the desired range.  WHAT DO THE RESULTS MEAN?  Total cholesterol is a rough measure of all the cholesterol in your blood.   LDL is the so-called bad cholesterol. This is the type that deposits cholesterol in the walls of the arteries. You want this level to be low.   HDL is the good cholesterol because it cleans the arteries and carries the LDL away. You want this level to be high.  Triglycerides are fat that the body can either burn for energy or store. High levels are closely linked to heart disease.  WHAT ARE THE DESIRED LEVELS OF CHOLESTEROL?  Total cholesterol below 200.   LDL below 100 for people at risk, below 70 for those at very high risk.   HDL above 50 is good, above 60 is best.   Triglycerides below 150.  HOW CAN I LOWER MY CHOLESTEROL?  Diet. Follow your diet programs as directed by your health care provider.   Choose fish or white meat chicken and [place], roasted or baked. Limit fatty cuts of red  meat, fried foods, and processed meats, such as sausage and lunch meats.   Eat lots of fresh fruits and vegetables.  Choose whole grains, beans, pasta, potatoes, and cereals.   Use only small amounts of olive, corn, or canola oils.   Avoid butter, mayonnaise, shortening, or palm kernel oils.  Avoid foods with trans fats.   Drink skim or nonfat milk and eat low-fat or nonfat yogurt and cheeses. Avoid whole milk, cream, ice cream, egg yolks, and full-fat cheeses.   Healthy desserts include angel food cake, ginger snaps, animal crackers, hard candy, popsicles, and low-fat or nonfat frozen yogurt. Avoid pastries,  cakes, pies, and cookies.   Exercise. Follow your exercise programs as directed by your health care provider.   A regular program helps decrease LDL and raise HDL.   A regular program helps with weight control.   Do things that increase your activity level like gardening, walking, or taking the stairs. Ask your health care provider about how you can be more active in your daily life.   Medicine. Take medicine only as directed by your health care provider.   Medicine may be prescribed by your health care provider to help lower cholesterol and decrease the risk for heart disease.   If you have several risk factors, you may need medicine even if your levels are normal.   This information is not intended to replace advice given to you by your health care provider. Make sure you discuss any questions you have with your health care provider.   Document Released: 12/06/2000 Document Revised: 04/03/2014 Document Reviewed: 12/25/2012 Elsevier Interactive Patient Education [employer]2016 [employer]vier Inc.

## 2015-09-15 NOTE — Progress Notes (Signed)
Assessment and Plan: 1. Hypogonadism male - testosterone cypionate (DEPOTESTOSTERONE CYPIONATE) injection 400 mg; Inject 2 mLs (400 mg total) into the muscle once.  2. Hyperlipidemia - Hepatic function panel - Lipid panel  3. Medication management - CK  Future Appointments Date Time Provider Department Center  08/01/2016 10:00 AM Quentin MullingAmanda Lindalou Soltis, PA-C [facility] None  08/14/2016 2:00 PM Quentin MullingAmanda Fadumo Heng, PA-C [facility] None    HPI 60 y.o.male presents for cholesterol recheck. He is back on the pravachol 20mg  daily, denies any myalgias. Will recheck Chol, LFTs, and CPK today.  Has rash on left gluteal fold from spider bite, has been putting gold bond on it and it has improved.   Past Medical History  Diagnosis Date  . Hyperlipidemia   . Hypertension   . Prediabetes   . GERD (gastroesophageal reflux disease)   . Vitamin D deficiency   . Other testicular hypofunction      Allergies  Allergen Reactions  . Codeine     REACTION: Anxiety  . Sertraline Swelling  . Statins     Elevated CPK      Current Outpatient Prescriptions on File Prior to Visit  Medication Sig Dispense Refill  . aspirin 81 MG chewable tablet Chew by mouth daily.    . bisoprolol-hydrochlorothiazide (ZIAC) 5-6.25 MG tablet TAKE ONE TABLET BY MOUTH ONCE DAILY FOR  BLOOD  PRESSURE 90 tablet 0  . doxazosin (CARDURA) 1 MG tablet Take 1 tablet (1 mg total) by mouth daily. 90 tablet 1  . ezetimibe (ZETIA) 10 MG tablet Take 1 tablet (10 mg total) by mouth daily. 30 tablet 2  . pravastatin (PRAVACHOL) 20 MG tablet Take 1 tablet (20 mg total) by mouth every evening. 30 tablet 11  . ranitidine (ZANTAC) 300 MG tablet TAKE ONE TABLET BY MOUTH ONCE DAILY FOR ACID REFLUX 90 tablet 1  . testosterone cypionate (DEPOTESTOSTERONE CYPIONATE) 200 MG/ML injection INJECT 2 ML INTRAMUSCULARLY EVERY 2 WEEKS OR AS DIRECTED 10 mL 0   Current Facility-Administered Medications on File Prior to Visit  Medication Dose Route Frequency  Provider Last Rate Last Dose  . testosterone cypionate (DEPOTESTOSTERONE CYPIONATE) injection 400 mg  400 mg Intramuscular Q14 Days Lucky CowboyWilliam McKeown, MD   400 mg at 08/12/15 1025    ROS: all negative except above.   Physical Exam: Filed Weights   09/15/15 0830  Weight: 238 lb 6.4 oz (108.138 kg)   BP 120/92 mmHg  Pulse 67  Temp(Src) 98.4 F (36.9 C) (Temporal)  Resp 16  Ht 6' 0.5" (1.842 m)  Wt 238 lb 6.4 oz (108.138 kg)  BMI 31.87 kg/m2  SpO2 98% General Appearance: Well nourished, in no apparent distress. Eyes: PERRLA, EOMs, conjunctiva no swelling or erythema Sinuses: No Frontal/maxillary tenderness ENT/Mouth: Ext aud canals clear, TMs without erythema, bulging. No erythema, swelling, or exudate on post pharynx.  Tonsils not swollen or erythematous. Hearing normal.  Neck: Supple, thyroid normal.  Respiratory: Respiratory effort normal, BS equal bilaterally without rales, rhonchi, wheezing or stridor.  Cardio: RRR with no MRGs. Brisk peripheral pulses without edema.  Abdomen: Soft, + BS.  Non tender, no guarding, rebound, hernias, masses. Lymphatics: Non tender without lymphadenopathy.  Musculoskeletal: Full ROM, 5/5 strength, normal gait.  Skin: Warm, dry without rashes, lesions, ecchymosis.  Neuro: Cranial nerves intact. Normal muscle tone, no cerebellar symptoms. Sensation intact.  Psych: Awake and oriented X 3, normal affect, Insight and Judgment appropriate.     Quentin MullingAmanda Aris Even, PA-C 8:55 AM [facility]

## 2015-09-27 ENCOUNTER — Ambulatory Visit (INDEPENDENT_AMBULATORY_CARE_PROVIDER_SITE_OTHER): Payer: BC Managed Care – PPO | Admitting: Family Medicine

## 2015-09-27 ENCOUNTER — Telehealth: Payer: Self-pay

## 2015-09-27 VITALS — BP 130/80 | HR 82 | Temp 98.1°F | Resp 17 | Ht 73.0 in | Wt 243.0 lb

## 2015-09-27 DIAGNOSIS — L02416 Cutaneous abscess of left lower limb: Secondary | ICD-10-CM

## 2015-09-27 DIAGNOSIS — L03119 Cellulitis of unspecified part of limb: Secondary | ICD-10-CM

## 2015-09-27 DIAGNOSIS — L02419 Cutaneous abscess of limb, unspecified: Secondary | ICD-10-CM

## 2015-09-27 MED ORDER — CEPHALEXIN 500 MG PO CAPS: 500.0000 mg | ORAL_CAPSULE | Freq: Four times a day (QID) | ORAL | Status: DC

## 2015-09-27 MED ORDER — SULFAMETHOXAZOLE-TRIMETHOPRIM 800-160 MG PO TABS: 2.0000 | ORAL_TABLET | Freq: Two times a day (BID) | ORAL | Status: DC

## 2015-09-27 MED ORDER — HYDROCODONE-ACETAMINOPHEN 5-325 MG PO TABS: 1.0000 | ORAL_TABLET | Freq: Four times a day (QID) | ORAL | Status: DC | PRN

## 2015-09-27 NOTE — Progress Notes (Addendum)
Subjective:    Patient ID: Mark [name], male    DOB: March 27, 1956, 60 y.o.   MRN: [id number] By signing my name below, I, Mark [name], attest that this documentation has been prepared under the direction and in the presence of Mark SorensonEva Shaw, MD. Electronically Signed: Javier Dockerobert Ryan [name], ER Scribe. 09/27/2015. 10:36 AM.  Chief Complaint  Patient presents with  . Insect Bite    LEFT KNEE, VERY PAINFUL,NOTICE 3 DAYS AGO     HPI HPI Comments: Mark BeagleMichael J Alleva is a 60 y.o. male who [facility] complaining of a wound on his left knee with swelling and redness that he first noticed two days ago. He states the pain radiates superiorly into his muscle. He believes the wound began after he was bitten by an insect. He did not see what bit him. He has a hx of type II DM. He has KNDA.   Past Medical History  Diagnosis Date  . Hyperlipidemia   . Hypertension   . Prediabetes   . GERD (gastroesophageal reflux disease)   . Vitamin D deficiency   . Other testicular hypofunction    Allergies  Allergen Reactions  . Codeine     REACTION: Anxiety   Current Outpatient Prescriptions on File Prior to Visit  Medication Sig Dispense Refill  . aspirin 81 MG chewable tablet Chew by mouth daily.    . bisoprolol-hydrochlorothiazide (ZIAC) 5-6.25 MG tablet TAKE ONE TABLET BY MOUTH ONCE DAILY FOR  BLOOD  PRESSURE 90 tablet 0  . doxazosin (CARDURA) 1 MG tablet Take 1 tablet (1 mg total) by mouth daily. 90 tablet 1  . metFORMIN (GLUCOPHAGE-XR) 500 MG 24 hr tablet Take 2 tablets (1,000 mg total) by mouth 2 (two) times daily. 120 tablet 0  . pravastatin (PRAVACHOL) 20 MG tablet Take 1 tablet (20 mg total) by mouth every evening. 30 tablet 11  . ranitidine (ZANTAC) 300 MG tablet TAKE ONE TABLET BY MOUTH ONCE DAILY FOR ACID REFLUX 90 tablet 1  . testosterone cypionate (DEPOTESTOSTERONE CYPIONATE) 200 MG/ML injection INJECT 2 ML INTRAMUSCULARLY EVERY 2 WEEKS OR AS DIRECTED 10 mL 0   Current  Facility-Administered Medications on File Prior to Visit  Medication Dose Route Frequency Provider Last Rate Last Dose  . testosterone cypionate (DEPOTESTOSTERONE CYPIONATE) injection 400 mg  400 mg Intramuscular Q14 Days Lucky CowboyWilliam McKeown, MD   400 mg at 08/12/15 1025   Depression screen PHQ 2/9 09/27/2015  Decreased Interest 0  Down, Depressed, Hopeless 0  PHQ - 2 Score 0     Review of Systems  Constitutional: Positive for activity change. Negative for fever and chills.  Musculoskeletal: Positive for myalgias, joint swelling, arthralgias and gait problem.  Skin: Positive for color change and wound.  Neurological: Negative for weakness and numbness.  Psychiatric/Behavioral: Positive for sleep disturbance. Negative for dysphoric mood.      Objective:  BP 130/80 mmHg  Pulse 82  Temp(Src) 98.1 F (36.7 C) (Oral)  Resp 17  Ht 6\' 1"  (1.854 m)  Wt 243 lb (110.224 kg)  BMI 32.07 kg/m2  SpO2 96%  Physical Exam  Constitutional: He is oriented to person, place, and time. He appears well-developed and well-nourished. No distress.  HENT:  Head: Normocephalic and atraumatic.  Eyes: Pupils are equal, round, and reactive to light.  Neck: Neck supple.  Cardiovascular: Normal rate.   Pulmonary/Chest: Effort normal. No respiratory distress.  Musculoskeletal: Normal range of motion.  Neurological: He is alert and oriented to person, place, and  time. Coordination normal.  Skin: Skin is warm and dry. He is not diaphoretic.  4/5 cm indurated, erythematous with 5mm central ulceration with eschar. No significant fluctuance but very warm and tender.   Psychiatric: He has a normal mood and affect. His behavior is normal.  Nursing note and vitals reviewed.       Assessment & Plan:   1. Cellulitis and abscess of leg   cbc was not drawn before pt left. Cellulitis and abscess area outlined by skin script marker. S/p I&D by Chestine Sporelark - packing placed. Recheck in 48 hrs - to ER if worsening in the  interim. Since pt has DM and cellulitis is over a joint while spreading proximally and distally, will double cover with Keflex and high dose Bactrim while clx is P.  Orders Placed This Encounter  Procedures  . WOUND CULTURE    Order Specific Question:  Source    Answer:  left knee  . Care order/instruction    AVS and GO AFTER CBC IS DRAWN, NEEDS FT CARD FOR WOUND CARE WITH [facility] ON WED 7/5    Scheduling Instructions:     AVS and GO  . POCT CBC    Meds ordered this encounter  Medications  . cephALEXin (KEFLEX) 500 MG capsule    Sig: Take 1 capsule (500 mg total) by mouth 4 (four) times daily.    Dispense:  40 capsule    Refill:  0  . sulfamethoxazole-trimethoprim (BACTRIM DS,SEPTRA DS) 800-160 MG tablet    Sig: Take 2 tablets by mouth 2 (two) times daily.    Dispense:  56 tablet    Refill:  0    I personally performed the services described in this documentation, which was scribed in my presence. The recorded information has been reviewed and considered, and addended by me as needed.   Mark [name], M.D.  [facility]  [place] [address] [place], [place] [address] [phone] phone [phone] fax  09/27/2015 1:31 PM

## 2015-09-27 NOTE — Progress Notes (Signed)
Risk and benefits discussed and verbal consent obtained. Anesthetic allergies reviewed. Patient anesthetized using 1:1 mix of 2% lidocaine with epi. A 1 cm incision was made using a number 11 blade and purulent material was expressed.  The was wound packed. The patient tolerated the procedure without difficulty.   A clean dressing was placed and wound care instructions were provided.  Deliah BostonMichael Tiegan Terpstra, MS, PA-C 11:11 AM, 09/27/2015

## 2015-09-27 NOTE — Telephone Encounter (Signed)
Patient requesting pain medication for his procedure this morning.   [phone]  [facility] on [address]

## 2015-09-27 NOTE — Telephone Encounter (Signed)
LMOM to CB. Hydrocodone Rx in drawer.

## 2015-09-27 NOTE — Patient Instructions (Addendum)
Incision and Drainage, Care After Refer to this sheet in the next few weeks. These instructions provide you with information on caring for yourself after your procedure. Your caregiver may also give you more specific instructions. Your treatment has been planned according to current medical practices, but problems sometimes occur. Call your caregiver if you have any problems or questions after your procedure. HOME CARE INSTRUCTIONS   If antibiotic medicine is given, take it as directed. Finish it even if you start to feel better.  Only take over-the-counter or prescription medicines for pain, discomfort, or fever as directed by your caregiver.  Keep all follow-up appointments as directed by your caregiver.  Change any bandages (dressings) as directed by your caregiver. Replace old dressings with clean dressings.  Wash your hands before and after caring for your wound. You will receive specific instructions for cleansing and caring for your wound.  SEEK MEDICAL CARE IF:   You have increased pain, swelling, or redness around the wound.  You have increased drainage, smell, or bleeding from the wound.  You have muscle aches, chills, or you feel generally sick.  You have a fever. MAKE SURE YOU:   Understand these instructions.  Will watch your condition.  Will get help right away if you are not doing well or get worse.   This information is not intended to replace advice given to you by your health care provider. Make sure you discuss any questions you have with your health care provider.   Document Released: 06/05/2011 Document Revised: 04/03/2014 Document Reviewed: 06/05/2011 Elsevier Interactive Patient Education 2016 Elsevier Inc.  Incision and Drainage Incision and drainage is a procedure in which a sac-like structure (cystic structure) is opened and drained. The area to be drained usually contains material such as pus, fluid, or blood.  LET YOUR CAREGIVER KNOW ABOUT:    Allergies to medicine.  Medicines taken, including vitamins, herbs, eyedrops, over-the-counter medicines, and creams.  Use of steroids (by mouth or creams).  Previous problems with anesthetics or numbing medicines.  History of bleeding problems or blood clots.  Previous surgery.  Other health problems, including diabetes and kidney problems.  Possibility of pregnancy, if this applies. RISKS AND COMPLICATIONS  Pain.  Bleeding.  Scarring.  Infection. BEFORE THE PROCEDURE  You may need to have an ultrasound or other imaging tests to see how large or deep your cystic structure is. Blood tests may also be used to determine if you have an infection or how severe the infection is. You may need to have a tetanus shot. PROCEDURE  The affected area is cleaned with a cleaning fluid. The cyst area will then be numbed with a medicine (local anesthetic). A small incision will be made in the cystic structure. A syringe or catheter may be used to drain the contents of the cystic structure, or the contents may be squeezed out. The area will then be flushed with a cleansing solution. After cleansing the area, it is often gently packed with a gauze or another wound dressing. Once it is packed, it will be covered with gauze and tape or some other type of wound dressing. AFTER THE PROCEDURE   Often, you will be allowed to go home right after the procedure.  You may be given antibiotic medicine to prevent or heal an infection.  If the area was packed with gauze or some other wound dressing, you will likely need to come back in 1 to 2 days to get it removed.  The area should  heal in about 14 days.   This information is not intended to replace advice given to you by your health care provider. Make sure you discuss any questions you have with your health care provider.   Document Released: 09/06/2000 Document Revised: 09/12/2011 Document Reviewed: 05/08/2011 Elsevier Interactive Patient  Education [employer]2016 Elsevier Inc.   IF you received an x-ray today, you will receive an invoice from [facility]. Please contact [facility] at [phone] with questions or concerns regarding your invoice.   IF you received labwork today, you will receive an invoice from [facility]. Please contact [facility] at [phone] with questions or concerns regarding your invoice.   Our billing staff will not be able to assist you with questions regarding bills from these companies.  You will be contacted with the lab results as soon as they are available. The fastest way to get your results is to activate your My Chart account. Instructions are located on the last page of this paperwork. If you have not heard from us regarding the results in 2 weeks, please contact this office.     Cellulitis Cellulitis is an infection of the skin and the tissue beneath it. The infected area is usually red and tender. Cellulitis occurs most often in the arms and lower legs.  CAUSES  Cellulitis is caused by bacteria that enter the skin through cracks or cuts in the skin. The most common types of bacteria that cause cellulitis are staphylococci and streptococci. SIGNS AND SYMPTOMS   Redness and warmth.  Swelling.  Tenderness or pain.  Fever. DIAGNOSIS  Your health care provider can usually determine what is wrong based on a physical exam. Blood tests may also be done. TREATMENT  Treatment usually involves taking an antibiotic medicine. HOME CARE INSTRUCTIONS   Take your antibiotic medicine as directed by your health care provider. Finish the antibiotic even if you start to feel better.  Keep the infected arm or leg elevated to reduce swelling.  Apply a warm cloth to the affected area up to 4 times per day to relieve pain.  Take medicines only as directed by your health care provider.  Keep all follow-up visits as directed by your health care provider. SEEK  MEDICAL CARE IF:   You notice red streaks coming from the infected area.  Your red area gets larger or turns dark in color.  Your bone or joint underneath the infected area becomes painful after the skin has healed.  Your infection returns in the same area or another area.  You notice a swollen bump in the infected area.  You develop new symptoms.  You have a fever. SEEK IMMEDIATE MEDICAL CARE IF:   You feel very sleepy.  You develop vomiting or diarrhea.  You have a general ill feeling (malaise) with muscle aches and pains.   This information is not intended to replace advice given to you by your health care provider. Make sure you discuss any questions you have with your health care provider.   Document Released: 12/21/2004 Document Revised: 12/02/2014 Document Reviewed: 05/29/2011 Elsevier Interactive Patient Education [employer]2016 Elsevier Inc.

## 2015-09-27 NOTE — Telephone Encounter (Signed)
Hydrocodone printed off for pt to pick up. If he does not want to come back here to get the rx, we can call in tramadol 50mg  1-2 tabs po q8hrs prn pain disp 40, no refills. He is allergic to codeine so please let him know that the hydrocodone is related (but most people do fine.)

## 2015-09-29 ENCOUNTER — Ambulatory Visit (INDEPENDENT_AMBULATORY_CARE_PROVIDER_SITE_OTHER): Payer: BC Managed Care – PPO | Admitting: Physician Assistant

## 2015-09-29 VITALS — BP 122/80 | HR 75 | Temp 97.5°F | Resp 16 | Ht 73.0 in | Wt 240.0 lb

## 2015-09-29 DIAGNOSIS — Z5189 Encounter for other specified aftercare: Secondary | ICD-10-CM

## 2015-09-29 DIAGNOSIS — L03119 Cellulitis of unspecified part of limb: Secondary | ICD-10-CM

## 2015-09-29 DIAGNOSIS — L02419 Cutaneous abscess of limb, unspecified: Secondary | ICD-10-CM

## 2015-09-29 DIAGNOSIS — L03116 Cellulitis of left lower limb: Secondary | ICD-10-CM

## 2015-09-29 LAB — WOUND CULTURE

## 2015-09-29 NOTE — Progress Notes (Signed)
[facility] [address], [place] [address] [phone]  Date:  09/29/2015   Name:  Mark [name]   DOB:  24-Mar-1956   MRN:  [id number]  PCP:  Nadean CorwinMCKEOWN,WILLIAM DAVID, MD    History of Present Illness:  Mark [name] is a 60 y.o. male patient who [facility] up of left knee.  Patient was seen here 2 days ago for knee abscess.  He has been compliant on medication.  He has not changed the dressing.  Swelling and pain have decreased a lot.  Redness has also minimized.     Patient Active Problem List   Diagnosis Date Noted  . Morbid obesity (BMI 32)  07/27/2014  . Medication management 07/27/2014  . Hyperlipidemia   . Hypertension   . 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 GERD (gastroesophageal reflux disease)   . Vitamin D deficiency   . Testosterone deficiency     Past Medical History  Diagnosis Date  . Hyperlipidemia   . Hypertension   . Prediabetes   . GERD (gastroesophageal reflux disease)   . Vitamin D deficiency   . Other testicular hypofunction     Past Surgical History  Procedure Laterality Date  . Vasectomy  01/2004  . Urethral dilation  2007    Social History  Substance Use Topics  . Smoking status: Never Smoker   . Smokeless tobacco: None  . Alcohol Use: No    Family History  Problem Relation Age of Onset  . Hypertension Mother   . Hyperlipidemia Mother   . COPD Father   . Heart disease Father   . Hypertension Father     Allergies  Allergen Reactions  . Codeine     REACTION: Anxiety    Medication list has been reviewed and updated.  Current Outpatient Prescriptions on File Prior to Visit  Medication Sig Dispense Refill  . aspirin 81 MG chewable tablet Chew by mouth daily.    . bisoprolol-hydrochlorothiazide (ZIAC) 5-6.25 MG tablet TAKE ONE TABLET BY MOUTH ONCE DAILY FOR  BLOOD  PRESSURE 90 tablet 0  . cephALEXin (KEFLEX) 500 MG capsule Take 1 capsule (500 mg total) by mouth 4 (four) times daily. 40  capsule 0  . doxazosin (CARDURA) 1 MG tablet Take 1 tablet (1 mg total) by mouth daily. 90 tablet 1  . HYDROcodone-acetaminophen (NORCO/VICODIN) 5-325 MG tablet Take 1 tablet by mouth every 6 (six) hours as needed for moderate pain. 30 tablet 0  . metFORMIN (GLUCOPHAGE-XR) 500 MG 24 hr tablet Take 2 tablets (1,000 mg total) by mouth 2 (two) times daily. 120 tablet 0  . pravastatin (PRAVACHOL) 20 MG tablet Take 1 tablet (20 mg total) by mouth every evening. 30 tablet 11  . ranitidine (ZANTAC) 300 MG tablet TAKE ONE TABLET BY MOUTH ONCE DAILY FOR ACID REFLUX 90 tablet 1  . sulfamethoxazole-trimethoprim (BACTRIM DS,SEPTRA DS) 800-160 MG tablet Take 2 tablets by mouth 2 (two) times daily. 56 tablet 0  . testosterone cypionate (DEPOTESTOSTERONE CYPIONATE) 200 MG/ML injection INJECT 2 ML INTRAMUSCULARLY EVERY 2 WEEKS OR AS DIRECTED 10 mL 0   Current Facility-Administered Medications on File Prior to Visit  Medication Dose Route Frequency Provider Last Rate Last Dose  . testosterone cypionate (DEPOTESTOSTERONE CYPIONATE) injection 400 mg  400 mg Intramuscular Q14 Days Lucky CowboyWilliam McKeown, MD   400 mg at 08/12/15 1025    ROS ROS otherwise unremarkable unless listed above.   Physical Examination: BP  122/80 mmHg  Pulse 75  Temp(Src) 97.5 F (36.4 C) (Oral)  Resp 16  Ht 6\' 1"  (1.854 m)  Wt 240 lb (108.863 kg)  BMI 31.67 kg/m2  SpO2 96% Ideal Body Weight: Weight in (lb) to have BMI = 25: 189.1  Physical Exam  Constitutional: He is oriented to person, place, and time. He appears well-developed and well-nourished. No distress.  HENT:  Head: Normocephalic and atraumatic.  Eyes: Conjunctivae and EOM are normal. Pupils are equal, round, and reactive to light.  Cardiovascular: Normal rate.   Pulmonary/Chest: Effort normal. No respiratory distress.  Neurological: He is alert and oriented to person, place, and time.  Skin: Skin is warm and dry. He is not diaphoretic.  Localized erythema along the wound  site.  Necrotic tissue in the wound, was cleansed with some red tissue from what he could tolerate.  1/4 packing placed at the wound.  Demarcation line of prior skin changes has very little redness.    Psychiatric: He has a normal mood and affect. His behavior is normal.     Assessment and Plan: Mark BeagleMichael J [name] is a 60 y.o. male who is here today for wound care. Advised to return in 2 days. Cleaning was difficult, as he did not tolerate this well at all. Advised to continue abx, and change dressing daily.   Cellulitis of left lower extremity  Encounter for wound care  Cellulitis and abscess of leg  Trena PlattStephanie English, PA-C [facility] 7/5/20175:39 PM

## 2015-09-29 NOTE — Patient Instructions (Signed)
     IF you received an x-ray today, you will receive an invoice from [facility]. Please contact [facility] at [phone] with questions or concerns regarding your invoice.   IF you received labwork today, you will receive an invoice from [facility]. Please contact [facility] at [phone] with questions or concerns regarding your invoice.   Our billing staff will not be able to assist you with questions regarding bills from these companies.  You will be contacted with the lab results as soon as they are available. The fastest way to get your results is to activate your My Chart account. Instructions are located on the last page of this paperwork. If you have not heard from us regarding the results in 2 weeks, please contact this office.      

## 2015-10-04 ENCOUNTER — Ambulatory Visit (INDEPENDENT_AMBULATORY_CARE_PROVIDER_SITE_OTHER): Payer: BC Managed Care – PPO | Admitting: *Deleted

## 2015-10-04 DIAGNOSIS — E291 Testicular hypofunction: Secondary | ICD-10-CM | POA: Diagnosis not present

## 2015-10-04 NOTE — Progress Notes (Signed)
Patient ID: Mark [name], male   DOB: May 24, 1955, 60 y.o.   MRN: [id number] Patient presents for testosterone injection for hypogonadism Tx.  Patient received 2 cc IM left glut and tolerated well.  Will RTC in 2- 3 weeks for next injection.

## 2015-10-17 ENCOUNTER — Other Ambulatory Visit: Payer: Self-pay | Admitting: Internal Medicine

## 2015-10-18 ENCOUNTER — Encounter: Payer: Self-pay | Admitting: Physician Assistant

## 2015-10-18 ENCOUNTER — Ambulatory Visit (INDEPENDENT_AMBULATORY_CARE_PROVIDER_SITE_OTHER): Payer: BC Managed Care – PPO | Admitting: *Deleted

## 2015-10-18 ENCOUNTER — Other Ambulatory Visit: Payer: Self-pay | Admitting: Physician Assistant

## 2015-10-18 DIAGNOSIS — E291 Testicular hypofunction: Secondary | ICD-10-CM

## 2015-10-18 NOTE — Progress Notes (Signed)
Patient ID: Mark [name], male   DOB: [birth year], 60 y.o.   MRN: [id number] Patient presents for testosterone injection for hypogonadism Tx.  Patient received 2 cc IM right glute and tolerated well.  Will RTC for next injection. Last of Rx given at this visit and reminded patient to get Rx refilled and bring to next appointment.

## 2015-11-01 ENCOUNTER — Ambulatory Visit: Payer: Self-pay

## 2015-11-08 ENCOUNTER — Ambulatory Visit (INDEPENDENT_AMBULATORY_CARE_PROVIDER_SITE_OTHER): Payer: BC Managed Care – PPO

## 2015-11-08 DIAGNOSIS — E291 Testicular hypofunction: Secondary | ICD-10-CM

## 2015-11-08 MED ORDER — TESTOSTERONE CYPIONATE 200 MG/ML IM SOLN
200.0000 mg | INTRAMUSCULAR | Status: DC
  Administered 2015-11-08 – 2015-11-23 (×2): 200 mg via INTRAMUSCULAR

## 2015-11-08 NOTE — Progress Notes (Signed)
Patient presents for testosterone injection for hypogonadism Tx.  Patient received 2 cc IM RIGHT glute and tolerated well.  Will RTC for next injection.

## 2015-11-23 ENCOUNTER — Ambulatory Visit (INDEPENDENT_AMBULATORY_CARE_PROVIDER_SITE_OTHER): Payer: BC Managed Care – PPO | Admitting: *Deleted

## 2015-11-23 DIAGNOSIS — E291 Testicular hypofunction: Secondary | ICD-10-CM

## 2015-11-23 NOTE — Progress Notes (Signed)
Patient presents for testosterone injection for hypogonadism Tx.  Patient received 2 cc IM left glute and tolerated well.  Patient will RTC in 2 weeks for next injection.

## 2015-12-17 ENCOUNTER — Ambulatory Visit: Payer: Self-pay | Admitting: Internal Medicine

## 2015-12-20 ENCOUNTER — Encounter: Payer: Self-pay | Admitting: Physician Assistant

## 2015-12-20 ENCOUNTER — Ambulatory Visit (INDEPENDENT_AMBULATORY_CARE_PROVIDER_SITE_OTHER): Payer: BC Managed Care – PPO | Admitting: Physician Assistant

## 2015-12-20 VITALS — BP 120/80 | HR 74 | Temp 97.7°F | Resp 16 | Wt 240.0 lb

## 2015-12-20 DIAGNOSIS — K6289 Other specified diseases of anus and rectum: Secondary | ICD-10-CM

## 2015-12-20 DIAGNOSIS — E291 Testicular hypofunction: Secondary | ICD-10-CM

## 2015-12-20 DIAGNOSIS — R198 Other specified symptoms and signs involving the digestive system and abdomen: Secondary | ICD-10-CM | POA: Diagnosis not present

## 2015-12-20 MED ORDER — TESTOSTERONE CYPIONATE 200 MG/ML IM SOLN
200.0000 mg | INTRAMUSCULAR | Status: DC
  Administered 2015-12-20: 200 mg via INTRAMUSCULAR

## 2015-12-20 NOTE — Progress Notes (Signed)
TEST INJECTION WAS GIVEN IM INTO RT GLUTE.

## 2015-12-20 NOTE — Progress Notes (Signed)
   Subjective:    Patient ID: Mark BeagleMichael J [name], male    DOB: Jan 15, 1956, 60 y.o.   MRN: [id number]  HPI 60 y.o. WM presents with pea size nodule on bottom of his rectum. He noticed it while showering, he does not have any pain with it, no blood in stool or TP, no itching. States it has not gotten larger.   Blood pressure 120/80, pulse 74, temperature 97.7 F (36.5 C), resp. rate 16, weight 240 lb (108.9 kg), SpO2 96 %.  Medications Current Outpatient Prescriptions on File Prior to Visit  Medication Sig  . aspirin 81 MG chewable tablet Chew by mouth daily.  . bisoprolol-hydrochlorothiazide (ZIAC) 5-6.25 MG tablet TAKE ONE TABLET BY MOUTH ONCE DAILY FOR BLOOD PRESSURE  . doxazosin (CARDURA) 1 MG tablet Take 1 tablet (1 mg total) by mouth daily.  Marland Kitchen. HYDROcodone-acetaminophen (NORCO/VICODIN) 5-325 MG tablet Take 1 tablet by mouth every 6 (six) hours as needed for moderate pain.  . metFORMIN (GLUCOPHAGE-XR) 500 MG 24 hr tablet Take 2 tablets (1,000 mg total) by mouth 2 (two) times daily.  . pravastatin (PRAVACHOL) 20 MG tablet Take 1 tablet (20 mg total) by mouth every evening.  . ranitidine (ZANTAC) 300 MG tablet TAKE ONE TABLET BY MOUTH ONCE DAILY FOR ACID REFLUX  . testosterone cypionate (DEPOTESTOSTERONE CYPIONATE) 200 MG/ML injection INJECT 2 ML INTRAMUSCULARLY EVERY 2 WEEKS   Current Facility-Administered Medications on File Prior to Visit  Medication  . 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   Review of Systems  Gastrointestinal: Negative for abdominal pain.       Abnormal mass       Objective:   Physical Exam  Abdominal: Normal appearance. There is no tenderness.  Genitourinary: Rectal exam shows mass.  Rectal exam shows no external hemorrhoid, no fissure, no tenderness, anal tone normal and guaiac negative stool. Prostate is not enlarged.          Assessment & Plan:  Rectal hard non tender mass Hard pea size mass, will refer to GI for anoscope/evaluation Epson salts in the mean time

## 2015-12-20 NOTE — Patient Instructions (Addendum)
Can call GI to get appointment Phone: [phone] Do the epson salt bathes with warm water x 2

## 2015-12-21 ENCOUNTER — Telehealth: Payer: Self-pay | Admitting: Gastroenterology

## 2015-12-21 NOTE — Telephone Encounter (Signed)
Patient with painless knot on the outside of rectum.  He will come in and see Hyacinth MeekerJennifer, [place], [place] next week 12/29/15 1:15. He reports smaller today, after SITZ baths.  He will call and cancel if it is gone next week.

## 2015-12-21 NOTE — Telephone Encounter (Signed)
Left message for patient to call back  

## 2015-12-24 ENCOUNTER — Other Ambulatory Visit: Payer: Self-pay | Admitting: Physician Assistant

## 2015-12-24 MED ORDER — TAMSULOSIN HCL 0.4 MG PO CAPS: 0.4000 mg | ORAL_CAPSULE | Freq: Every day | ORAL | 3 refills | Status: DC

## 2015-12-29 ENCOUNTER — Ambulatory Visit (INDEPENDENT_AMBULATORY_CARE_PROVIDER_SITE_OTHER): Payer: BC Managed Care – PPO | Admitting: Physician Assistant

## 2015-12-29 ENCOUNTER — Encounter: Payer: Self-pay | Admitting: Physician Assistant

## 2015-12-29 VITALS — BP 140/90 | HR 89 | Ht 72.0 in | Wt 237.0 lb

## 2015-12-29 DIAGNOSIS — K648 Other hemorrhoids: Secondary | ICD-10-CM

## 2015-12-29 NOTE — Progress Notes (Signed)
Reviewed and agree with initial management plan.  Quanda Pavlicek T. Nikayla Madaris, MD FACG 

## 2015-12-29 NOTE — Progress Notes (Signed)
Chief Complaint: Rectal nodule  HPI:  Mark [name] is a 60 year old Caucasian male who was referred to me by Quentin Mulling, PA-C, for a complaint of rectal nodule.  According to chart review patient was recently seen by his PCP on 12/20/2015 and a pea-sized nodule was described on the bottom of his rectum. He noticed this while showering. He denied any pain with this or blood in his stool or on the toilet paper. It had not changed in size at that time. Exam noted an internal hard pea-sized nontender nodule just on the inside of the rectum. It was recommended the patient continue with sitz baths and he was referred to our office.   Per our records patient's last colonoscopy was on 11/04/09, performed by Dr. Russella Dar with findings of a 4 mm sessile polyp at the hepatic flexure and a 4 mm sessile polyp in the sigmoid colon. Pathology revealed hyperplastic polyps.it was recommend patient repeat colonoscopy in 10 years.    Today, the patient describes that about two weeks ago while he was in the shower he felt a pea-sized nodule on the inside of his rectum towards his right side. He tells me that this is painless and he has not noticed any discharge. He has not had a change in bowel habits or any abdominal pain. He did see his primary care provider as above on the 25th of this month and was referred here. He has not tried anything other than sitz baths to help with this, and that does help for "a little while", but then it seems to come back to the size and firmness that it was before.    Patient denies fever, chills, blood in his stool, melena, change in bowel habits, weight loss, fatigue, nausea, vomiting, reflux, abdominal pain or symptoms that awaken him at night.   Past Medical History:  Diagnosis Date  . GERD (gastroesophageal reflux disease)   . Hyperlipidemia   . Hypertension   . Other testicular hypofunction   . Prediabetes   . Vitamin D deficiency     Past Surgical History:  Procedure  Laterality Date  . URETHRAL DILATION  2007  . VASECTOMY  01/2004    Current Outpatient Prescriptions  Medication Sig Dispense Refill  . aspirin 81 MG chewable tablet Chew by mouth daily.    . bisoprolol-hydrochlorothiazide (ZIAC) 5-6.25 MG tablet TAKE ONE TABLET BY MOUTH ONCE DAILY FOR BLOOD PRESSURE 90 tablet 0  . HYDROcodone-acetaminophen (NORCO/VICODIN) 5-325 MG tablet Take 1 tablet by mouth every 6 (six) hours as needed for moderate pain. 30 tablet 0  . metFORMIN (GLUCOPHAGE-XR) 500 MG 24 hr tablet Take 2 tablets (1,000 mg total) by mouth 2 (two) times daily. 120 tablet 0  . pravastatin (PRAVACHOL) 20 MG tablet Take 1 tablet (20 mg total) by mouth every evening. 30 tablet 11  . ranitidine (ZANTAC) 300 MG tablet TAKE ONE TABLET BY MOUTH ONCE DAILY FOR ACID REFLUX 90 tablet 1  . tamsulosin (FLOMAX) 0.4 MG CAPS capsule Take 1 capsule (0.4 mg total) by mouth at bedtime. 30 capsule 3  . testosterone cypionate (DEPOTESTOSTERONE CYPIONATE) 200 MG/ML injection INJECT 2 ML INTRAMUSCULARLY EVERY 2 WEEKS 10 mL 2   No current facility-administered medications for this visit.     Allergies as of 12/29/2015 - Review Complete 09/29/2015  Allergen Reaction Noted  . Codeine  10/28/2009    Family History  Problem Relation Age of Onset  . Hypertension Mother   . Hyperlipidemia Mother   . COPD  Father   . Heart disease Father   . Hypertension Father     Social History   Social History  . Marital status: Married    Spouse name: N/A  . Number of children: N/A  . Years of education: N/A   Occupational History  . Not on file.   Social History Main Topics  . Smoking status: Never Smoker  . Smokeless tobacco: Not on file  . Alcohol use No  . Drug use: No  . Sexual activity: Not on file   Other Topics Concern  . Not on file   Social History Narrative  . No narrative on file    Review of Systems:     Constitutional: No weight loss, fever, chills, weakness or fatigue HEENT: Eyes: No  change in vision               Ears, Nose, Throat:  No change in hearing or congestion Skin: No rash or itching Cardiovascular: No chest pain, chest pressure or palpitations   Respiratory: No SOB or cough Gastrointestinal: See HPI and otherwise negative Genitourinary: No dysuria or change in urinary frequency Neurological: No headache, dizziness or syncope Musculoskeletal: No new muscle or joint pain Hematologic: No bleeding or bruising Psychiatric: No history of depression or anxiety   Physical Exam:  Vital signs: BP 140/90   Pulse 89   Ht 6' (1.829 m)   Wt 237 lb (107.5 kg)   BMI 32.14 kg/m  General:   Pleasant Caucasian male appears to be in NAD, Well developed, Well nourished, alert and cooperative Head:  Normocephalic and atraumatic. Eyes:   PEERL, EOMI. No icterus. Conjunctiva pink. Ears:  Normal auditory acuity. Neck:  Supple Throat: Oral cavity and pharynx without inflammation, swelling or lesion.  Lungs: Respirations even and unlabored. Lungs clear to auscultation bilaterally.   No wheezes, crackles, or rhonchi.  Heart: Normal S1, S2. No MRG. Regular rate and rhythm. No peripheral edema, cyanosis or pallor.  Abdomen:  Soft, nondistended, nontender. No rebound or guarding. Normal bowel sounds. No appreciable masses or hepatomegaly. Rectal:  External exam: Some erythema around the anal opening, no visible external hemorrhoids or lesions; internal exam with anoscope: On palpation there is no tenderness, there is a pea-sized hard nodule on the right side of the patient's rectum approximately 2 cm from anal verge, on anuscope this is visibly purple in color Msk:  Symmetrical without gross deformities.  Extremities:  Without edema, no deformity or joint abnormality. Neurologic:  Alert and  oriented x4;  grossly normal neurologically.  Skin:   Dry and intact without significant lesions or rashes. Psychiatric: Oriented to person, place and time. Demonstrates good judgement and  reason without abnormal affect or behaviors.  MOST RECENT LABS:  CBC    Component Value Date/Time   WBC 6.0 08/09/2015 1617   RBC 5.10 08/09/2015 1617   HGB 15.1 08/09/2015 1617   HCT 45.1 08/09/2015 1617   PLT 243 08/09/2015 1617   MCV 88.4 08/09/2015 1617   MCH 29.6 08/09/2015 1617   MCHC 33.5 08/09/2015 1617   RDW 12.6 08/09/2015 1617   LYMPHSABS 2,280 08/09/2015 1617   MONOABS 480 08/09/2015 1617   EOSABS 180 08/09/2015 1617   BASOSABS 60 08/09/2015 1617    CMP     Component Value Date/Time   NA 136 08/09/2015 1617   K 4.4 08/09/2015 1617   CL 99 08/09/2015 1617   CO2 26 08/09/2015 1617   GLUCOSE 215 (H) 08/09/2015 1617  BUN 18 08/09/2015 1617   CREATININE 1.21 08/09/2015 1617   CALCIUM 9.1 08/09/2015 1617   PROT 6.6 09/15/2015 0905   ALBUMIN 3.9 09/15/2015 0905   AST 17 09/15/2015 0905   ALT 21 09/15/2015 0905   ALKPHOS 93 09/15/2015 0905   BILITOT 0.5 09/15/2015 0905   GFRNONAA 65 08/09/2015 1617   GFRAA 75 08/09/2015 1617    Assessment: 1. Internal hemorrhoid: On internal exam with anoscopy, visualized a purple nodule on the right side of the rectum, this was also looked at by Dr. Rhea Belton at time of exam and confirmed to be an internal hemorrhoid with no complications  Plan: 1. Discussed anti-hemorrhoidal measures with the patient. Provided him with a handout regarding these. 2. Patient may continue sitz baths 15-20 minutes up to 3-4 times a day if he would like to see if this decreases the size. 3. Offered suppositories, but the patient is not in any pain at this time and deferred. Recommended over-the-counter Preparation H suppositories twice a day 7 days if he would like to try something. 4. Patient will return to clinic as needed in the future or for his next colonoscopy in 2021.  Hyacinth Meeker, PA-C [facility] 12/29/2015, 1:08 PM  Cc: Meryl Dare, MD

## 2015-12-30 ENCOUNTER — Ambulatory Visit: Payer: Self-pay | Admitting: Internal Medicine

## 2016-01-05 ENCOUNTER — Ambulatory Visit: Payer: Self-pay | Admitting: Internal Medicine

## 2016-01-18 ENCOUNTER — Ambulatory Visit (INDEPENDENT_AMBULATORY_CARE_PROVIDER_SITE_OTHER): Payer: BC Managed Care – PPO

## 2016-01-18 DIAGNOSIS — E291 Testicular hypofunction: Secondary | ICD-10-CM | POA: Diagnosis not present

## 2016-01-18 MED ORDER — TESTOSTERONE CYPIONATE 200 MG/ML IM SOLN
200.0000 mg | INTRAMUSCULAR | Status: DC
  Administered 2016-01-18: 200 mg via INTRAMUSCULAR

## 2016-01-18 NOTE — Progress Notes (Signed)
Patient presents for testosterone injection for hypogonadism Tx.   Patient received 2 cc IM RIGHT glute and tolerated well.   Patient will RTC in 2 weeks for next injection.

## 2016-02-08 ENCOUNTER — Ambulatory Visit (INDEPENDENT_AMBULATORY_CARE_PROVIDER_SITE_OTHER): Payer: BC Managed Care – PPO

## 2016-02-08 DIAGNOSIS — E291 Testicular hypofunction: Secondary | ICD-10-CM

## 2016-02-08 MED ORDER — TESTOSTERONE CYPIONATE 200 MG/ML IM SOLN
200.0000 mg | INTRAMUSCULAR | Status: DC
  Administered 2016-02-08: 200 mg via INTRAMUSCULAR

## 2016-02-08 NOTE — Progress Notes (Signed)
Patient presents for testosterone injection for hypogonadism Tx.  Patient received 2 cc IM right glute per pt's request and tolerated well.  Patient will RTC in 2 weeks for next injection

## 2016-03-07 ENCOUNTER — Ambulatory Visit: Payer: Self-pay | Admitting: Physician Assistant

## 2016-03-07 NOTE — Progress Notes (Deleted)
Assessment and Plan:   Hypertension -Continue medication, monitor blood pressure at home. Continue DASH diet.  Reminder to go to the ER if any CP, SOB, nausea, dizziness, severe HA, changes vision/speech, left arm numbness and tingling and jaw pain.  Cholesterol -Continue diet and exercise. Check cholesterol.   Diabetes with diabetic chronic kidney disease -Continue diet and exercise. Check A1C  Vitamin D Def - check level and continue medications.   Morbid Obesity with co morbidities - long discussion about weight loss, diet, and exercise  Hypogonadism - continue to monitor, states medication is helping with symptoms of low T.   Continue diet and meds as discussed. Further disposition pending results of labs. Discussed med's effects and SE's.    Over 30 minutes of exam, counseling, chart review, and critical decision making was performed   HPI 60 y.o. male  presents for 3 month follow up on hypertension, cholesterol, diabetes and vitamin D deficiency.   His blood pressure has been controlled at home, today his BP is  .  He does not workout. He denies chest pain, shortness of breath, dizziness. Patient complains of christmas eve with some feeling bad, then woke up christmas day with HA, muscle aches, sinus congestion, dry cough, no fever/chills.   He is on cholesterol medication and denies myalgias. His cholesterol is not at goal,, he was switched to lipitor last visit. The cholesterol was:   Lab Results  Component Value Date   CHOL 162 09/15/2015   HDL 35 (L) 09/15/2015   LDLCALC 88 09/15/2015   TRIG 197 (H) 09/15/2015   CHOLHDL 4.6 09/15/2015    He has been working on diet and exercise for diabetes with diabetic chronic kidney disease, he is on bASA, he is not on ACE/ARB, and denies  paresthesia of the feet, polydipsia, polyuria and visual disturbances. Last A1C was:  Lab Results  Component Value Date   HGBA1C 7.4 (H) 08/09/2015  Last GFR:   Lab Results  Component  Value Date   GFRNONAA 65 08/09/2015   Patient is on Vitamin D supplement. Lab Results  Component Value Date   VD25OH 19 (L) 08/09/2015   He has a history of testosterone deficiency and is on testosterone replacement. He states that the testosterone helps with his energy, libido, muscle mass. Lab Results  Component Value Date   TESTOSTERONE 162 (L) 08/09/2015   BMI is There is no height or weight on file to calculate BMI., he is working on diet and exercise. Wt Readings from Last 3 Encounters:  12/29/15 237 lb (107.5 kg)  12/20/15 240 lb (108.9 kg)  09/29/15 240 lb (108.9 kg)    Current Medications:  Current Outpatient Prescriptions on File Prior to Visit  Medication Sig Dispense Refill  . aspirin 81 MG chewable tablet Chew by mouth daily.    . bisoprolol-hydrochlorothiazide (ZIAC) 5-6.25 MG tablet TAKE ONE TABLET BY MOUTH ONCE DAILY FOR BLOOD PRESSURE 90 tablet 0  . HYDROcodone-acetaminophen (NORCO/VICODIN) 5-325 MG tablet Take 1 tablet by mouth every 6 (six) hours as needed for moderate pain. 30 tablet 0  . metFORMIN (GLUCOPHAGE-XR) 500 MG 24 hr tablet Take 2 tablets (1,000 mg total) by mouth 2 (two) times daily. 120 tablet 0  . pravastatin (PRAVACHOL) 20 MG tablet Take 1 tablet (20 mg total) by mouth every evening. 30 tablet 11  . ranitidine (ZANTAC) 300 MG tablet TAKE ONE TABLET BY MOUTH ONCE DAILY FOR ACID REFLUX 90 tablet 1  . tamsulosin (FLOMAX) 0.4 MG CAPS capsule Take  1 capsule (0.4 mg total) by mouth at bedtime. 30 capsule 3  . testosterone cypionate (DEPOTESTOSTERONE CYPIONATE) 200 MG/ML injection INJECT 2 ML INTRAMUSCULARLY EVERY 2 WEEKS 10 mL 2   Current Facility-Administered Medications on File Prior to Visit  Medication Dose Route Frequency Provider Last Rate Last Dose  . testosterone cypionate (DEPOTESTOSTERONE CYPIONATE) injection 200 mg  200 mg Intramuscular Q14 Days Quentin MullingAmanda Jayland Null, PA-C   200 mg at 01/18/16 [id number]  . testosterone cypionate (DEPOTESTOSTERONE  CYPIONATE) injection 200 mg  200 mg Intramuscular Q14 Days Quentin MullingAmanda Stefannie Defeo, PA-C   200 mg at 02/08/16 1002   Medical History:  Past Medical History:  Diagnosis Date  . GERD (gastroesophageal reflux disease)   . Hyperlipidemia   . Hypertension   . Other testicular hypofunction   . Prediabetes   . Vitamin D deficiency    Allergies:  Allergies  Allergen Reactions  . Codeine     REACTION: Anxiety     Review of Systems:  Review of Systems  Constitutional: Positive for malaise/fatigue. Negative for chills, diaphoresis, fever and weight loss.  HENT: Positive for congestion and sore throat. Negative for ear discharge, ear pain, hearing loss, nosebleeds and tinnitus.   Eyes: Negative for blurred vision, double vision, photophobia, pain, discharge and redness.  Respiratory: Negative.  Negative for stridor.   Cardiovascular: Negative.  Negative for chest pain.  Gastrointestinal: Negative.   Genitourinary: Positive for dysuria. Negative for flank pain, frequency, hematuria and urgency.  Musculoskeletal: Positive for myalgias. Negative for back pain, falls, joint pain and neck pain.  Skin: Negative.   Neurological: Positive for headaches. Negative for dizziness, tingling, tremors, sensory change, speech change, focal weakness, seizures, loss of consciousness and weakness.  Psychiatric/Behavioral: Negative.  Negative for depression. The patient is not nervous/anxious.     Family history- Review and unchanged Social history- Review and unchanged Physical Exam: There were no vitals taken for this visit. Wt Readings from Last 3 Encounters:  12/29/15 237 lb (107.5 kg)  12/20/15 240 lb (108.9 kg)  09/29/15 240 lb (108.9 kg)   General Appearance: Well nourished, in no apparent distress. Eyes: PERRLA, EOMs, conjunctiva no swelling or erythema Sinuses: + Frontal/maxillary tenderness ENT/Mouth: Ext aud canals clear, TMs without erythema, bulging. No erythema, swelling, or exudate on post  pharynx.  Tonsils not swollen or erythematous. Hearing normal.  Neck: Supple, thyroid normal.  Respiratory: Respiratory effort normal, BS equal bilaterally without rales, rhonchi, wheezing or stridor.  Cardio: RRR with no MRGs. Brisk peripheral pulses without edema.  Abdomen: Soft, + BS.  Non tender, no guarding, rebound, hernias, masses. Lymphatics: Non tender without lymphadenopathy.  Musculoskeletal: Full ROM, 5/5 strength, Normal gait Skin: Warm, dry without rashes, lesions, ecchymosis.  Neuro: Cranial nerves intact. No cerebellar symptoms.  Psych: Awake and oriented X 3, normal affect, Insight and Judgment appropriate.    Quentin MullingAmanda Makaiyah Schweiger, PA-C 10:15 AM [facility]

## 2016-03-08 ENCOUNTER — Encounter: Payer: Self-pay | Admitting: Physician Assistant

## 2016-03-08 ENCOUNTER — Ambulatory Visit (INDEPENDENT_AMBULATORY_CARE_PROVIDER_SITE_OTHER): Payer: BC Managed Care – PPO | Admitting: Physician Assistant

## 2016-03-08 VITALS — BP 120/88 | HR 68 | Temp 97.4°F | Resp 16 | Ht 72.0 in | Wt 244.0 lb

## 2016-03-08 DIAGNOSIS — E1122 Type 2 diabetes mellitus with diabetic chronic kidney disease: Secondary | ICD-10-CM | POA: Diagnosis not present

## 2016-03-08 DIAGNOSIS — E349 Endocrine disorder, unspecified: Secondary | ICD-10-CM | POA: Diagnosis not present

## 2016-03-08 DIAGNOSIS — E785 Hyperlipidemia, unspecified: Secondary | ICD-10-CM

## 2016-03-08 DIAGNOSIS — N182 Chronic kidney disease, stage 2 (mild): Secondary | ICD-10-CM | POA: Diagnosis not present

## 2016-03-08 DIAGNOSIS — E559 Vitamin D deficiency, unspecified: Secondary | ICD-10-CM

## 2016-03-08 DIAGNOSIS — Z79899 Other long term (current) drug therapy: Secondary | ICD-10-CM

## 2016-03-08 DIAGNOSIS — I10 Essential (primary) hypertension: Secondary | ICD-10-CM

## 2016-03-08 LAB — CBC WITH DIFFERENTIAL/PLATELET
BASOS ABS: 0 {cells}/uL (ref 0–200)
Basophils Relative: 0 %
EOS ABS: 201 {cells}/uL (ref 15–500)
EOS PCT: 3 %
HCT: 48.4 % (ref 38.5–50.0)
Hemoglobin: 15.9 g/dL (ref 13.2–17.1)
LYMPHS PCT: 30 %
Lymphs Abs: 2010 cells/uL (ref 850–3900)
MCH: 29.1 pg (ref 27.0–33.0)
MCHC: 32.9 g/dL (ref 32.0–36.0)
MCV: 88.5 fL (ref 80.0–100.0)
MONOS PCT: 8 %
MPV: 10.7 fL (ref 7.5–12.5)
Monocytes Absolute: 536 cells/uL (ref 200–950)
NEUTROS PCT: 59 %
Neutro Abs: 3953 cells/uL (ref 1500–7800)
PLATELETS: 199 10*3/uL (ref 140–400)
RBC: 5.47 MIL/uL (ref 4.20–5.80)
RDW: 13.6 % (ref 11.0–15.0)
WBC: 6.7 10*3/uL (ref 3.8–10.8)

## 2016-03-08 LAB — TSH: TSH: 1.64 mIU/L (ref 0.40–4.50)

## 2016-03-08 MED ORDER — TESTOSTERONE CYPIONATE 200 MG/ML IM SOLN
200.0000 mg | INTRAMUSCULAR | Status: DC
  Administered 2016-03-08 – 2016-06-05 (×3): 200 mg via INTRAMUSCULAR

## 2016-03-08 NOTE — Patient Instructions (Signed)
Vitamin D goal is between 60-80  Please make sure that you are taking your Vitamin D as directed.   It is very important as a natural anti-inflammatory   helping hair, skin, and nails, as well as reducing stroke and heart attack risk.   It helps your bones and helps with mood.  It also decreases numerous cancer risks so please take it as directed.   Low Vit D is associated with a 200-300% higher risk for CANCER   and 200-300% higher risk for HEART   ATTACK  &  STROKE.    .....................................Marland Kitchen.  It is also associated with higher death rate at younger ages,   autoimmune diseases like Rheumatoid arthritis, Lupus, Multiple Sclerosis.     Also many other serious conditions, like depression, Alzheimer's  Dementia, infertility, muscle aches, fatigue, fibromyalgia - just to name a few.  +++++++++++++++++++  Can get liquid vitamin D from [place]  OR [place] at  [facility] [address], [place], [place] [id number]  Add on 5000 IU EVERY DAY!!  Diabetes is a very complicated disease...lets simplify it.  An easy way to look at it to understand the complications is if you think of the extra sugar floating in your blood stream as glass shards floating through your blood stream.    Diabetes affects your small vessels first: 1) The glass shards (sugar) scraps down the tiny blood vessels in your eyes and lead to diabetic retinopathy, the leading cause of blindness in the [place]. Diabetes is the leading cause of newly diagnosed adult (1920 to 61 years of age) blindness in the [place].  2) The glass shards scratches down the tiny vessels of your legs leading to nerve damage called neuropathy and can lead to amputations of your feet. More than 60% of all non-traumatic amputations of lower limbs occur in people with diabetes.  3) Over time the small vessels in your brain are shredded and closed off, individually this does not cause any problems but over a long  period of time many of the small vessels being blocked can lead to Vascular Dementia.   4) Your kidney's are a filter system and have a "net" that keeps certain things in the body and lets bad things out. Sugar shreds this net and leads to kidney damage and eventually failure. Decreasing the sugar that is destroying the net and certain blood pressure medications can help stop or decrease progression of kidney disease. Diabetes was the primary cause of kidney failure in 44 percent of all new cases in 2011.  5) Diabetes also destroys the small vessels in your penis that lead to erectile dysfunction. Eventually the vessels are so damaged that you may not be responsive to cialis or viagra.   Diabetes and your large vessels: Your larger vessels consist of your coronary arteries in your heart and the carotid vessels to your brain. Diabetes or even increased sugars put you at 300% increased risk of heart attack and stroke and this is why.. The sugar scrapes down your large blood vessels and your body sees this as an internal injury and tries to repair itself. Just like you get a scab on your skin, your platelets will stick to the blood vessel wall trying to heal it. This is why we have diabetics on low dose aspirin daily, this prevents the platelets from sticking and can prevent plaque formation. In addition, your body takes cholesterol and tries to shove it into the open wound. This is why we  want your LDL, or bad cholesterol, below 70.   The combination of platelets and cholesterol over 5-10 years forms plaque that can break off and cause a heart attack or stroke.   PLEASE REMEMBER:  Diabetes is preventable! Up to 85 percent of complications and morbidities among individuals with type 2 diabetes can be prevented, delayed, or effectively treated and minimized with regular visits to a health professional, appropriate monitoring and medication, and a healthy diet and lifestyle.     Bad carbs also include  fruit juice, alcohol, and sweet tea. These are empty calories that do not signal to your brain that you are full.   Please remember the good carbs are still carbs which convert into sugar. So please measure them out no more than 1/2-1 cup of rice, oatmeal, pasta, and beans  Veggies are however free foods! Pile them on.   Not all fruit is created equal. Please see the list below, the fruit at the bottom is higher in sugars than the fruit at the top. Please avoid all dried fruits.

## 2016-03-08 NOTE — Progress Notes (Signed)
Assessment and Plan:   Hypertension -Continue medication, monitor blood pressure at home. Continue DASH diet.  Reminder to go to the ER if any CP, SOB, nausea, dizziness, severe HA, changes vision/speech, left arm numbness and tingling and jaw pain.  Cholesterol -Continue diet and exercise. Check cholesterol.   Diabetes with diabetic chronic kidney disease -Continue diet and exercise. Check A1C  Vitamin D Def - check level and continue medications.   Morbid Obesity with co morbidities - long discussion about weight loss, diet, and exercise  Hypogonadism - continue to monitor, states medication is helping with symptoms of low T.   Continue diet and meds as discussed. Further disposition pending results of labs. Discussed med's effects and SE's.    Over 30 minutes of exam, counseling, chart review, and critical decision making was performed Future Appointments Date Time Provider Department Center  08/01/2016 10:00 AM Quentin MullingAmanda Albi Rappaport, PA-C [facility] None  08/14/2016 2:00 PM Quentin MullingAmanda Mackena Plummer, PA-C [facility] None  01/04/2017 9:00 AM Terri Piedraourtney Forcucci, PA-C [facility] None  '  HPI 60 y.o. male  presents for 3 month follow up on hypertension, cholesterol, diabetes and vitamin D deficiency.   His blood pressure has been controlled at home, today his BP is BP: 120/88.  He does not workout. He denies chest pain, shortness of breath, dizziness.  He is on cholesterol medication and denies myalgias. His cholesterol is not at goal,, he was switched to lipitor last visit. The cholesterol was:   Lab Results  Component Value Date   CHOL 162 09/15/2015   HDL 35 (L) 09/15/2015   LDLCALC 88 09/15/2015   TRIG 197 (H) 09/15/2015   CHOLHDL 4.6 09/15/2015    He has been working on diet and exercise for diabetes with diabetic chronic kidney disease, he is on bASA, he is not on ACE/ARB, and denies  paresthesia of the feet, polydipsia, polyuria and visual disturbances. Last A1C was:  Lab Results   Component Value Date   HGBA1C 7.4 (H) 08/09/2015  Last GFR:   Lab Results  Component Value Date   GFRNONAA 65 08/09/2015    Patient is on Vitamin D supplement, but has not been on lately.  Lab Results  Component Value Date   VD25OH 19 (L) 08/09/2015   He has a history of testosterone deficiency and is on testosterone replacement, shot today He states that the testosterone helps with his energy, libido, muscle mass. Lab Results  Component Value Date   TESTOSTERONE 162 (L) 08/09/2015   BMI is Body mass index is 33.09 kg/m., he is working on diet and exercise. Wt Readings from Last 3 Encounters:  03/08/16 244 lb (110.7 kg)  12/29/15 237 lb (107.5 kg)  12/20/15 240 lb (108.9 kg)    Current Medications:  Current Outpatient Prescriptions on File Prior to Visit  Medication Sig Dispense Refill  . aspirin 81 MG chewable tablet Chew by mouth daily.    . bisoprolol-hydrochlorothiazide (ZIAC) 5-6.25 MG tablet TAKE ONE TABLET BY MOUTH ONCE DAILY FOR BLOOD PRESSURE 90 tablet 0  . HYDROcodone-acetaminophen (NORCO/VICODIN) 5-325 MG tablet Take 1 tablet by mouth every 6 (six) hours as needed for moderate pain. 30 tablet 0  . metFORMIN (GLUCOPHAGE-XR) 500 MG 24 hr tablet Take 2 tablets (1,000 mg total) by mouth 2 (two) times daily. 120 tablet 0  . pravastatin (PRAVACHOL) 20 MG tablet Take 1 tablet (20 mg total) by mouth every evening. 30 tablet 11  . ranitidine (ZANTAC) 300 MG tablet TAKE ONE TABLET BY MOUTH ONCE DAILY  FOR ACID REFLUX 90 tablet 1  . tamsulosin (FLOMAX) 0.4 MG CAPS capsule Take 1 capsule (0.4 mg total) by mouth at bedtime. 30 capsule 3  . testosterone cypionate (DEPOTESTOSTERONE CYPIONATE) 200 MG/ML injection INJECT 2 ML INTRAMUSCULARLY EVERY 2 WEEKS 10 mL 2   Current Facility-Administered Medications on File Prior to Visit  Medication Dose Route Frequency Provider Last Rate Last Dose  . testosterone cypionate (DEPOTESTOSTERONE CYPIONATE) injection 200 mg  200 mg Intramuscular  Q14 Days Quentin MullingAmanda Balthazar Dooly, PA-C   200 mg at 01/18/16 [id number]  . testosterone cypionate (DEPOTESTOSTERONE CYPIONATE) injection 200 mg  200 mg Intramuscular Q14 Days Quentin MullingAmanda Baldwin [place]ot, PA-C   200 mg at 02/08/16 1002   Medical History:  Past Medical History:  Diagnosis Date  . GERD (gastroesophageal reflux disease)   . Hyperlipidemia   . Hypertension   . Other testicular hypofunction   . Prediabetes   . Vitamin D deficiency    Allergies:  Allergies  Allergen Reactions  . Codeine     REACTION: Anxiety     Review of Systems:  Review of Systems  Constitutional: Negative for chills, diaphoresis, fever, malaise/fatigue and weight loss.  HENT: Negative for congestion, ear discharge, ear pain, hearing loss, nosebleeds, sore throat and tinnitus.   Eyes: Negative for blurred vision, double vision, photophobia, pain, discharge and redness.  Respiratory: Negative.  Negative for stridor.   Cardiovascular: Negative.  Negative for chest pain.  Gastrointestinal: Negative.   Genitourinary: Negative for dysuria, flank pain, frequency, hematuria and urgency.  Musculoskeletal: Negative for back pain, falls, joint pain, myalgias and neck pain.  Skin: Negative.   Neurological: Negative for dizziness, tingling, tremors, sensory change, speech change, focal weakness, seizures, loss of consciousness, weakness and headaches.  Psychiatric/Behavioral: Negative.  Negative for depression. The patient is not nervous/anxious.     Family history- Review and unchanged Social history- Review and unchanged Physical Exam: BP 120/88   Pulse 68   Temp 97.4 F (36.3 C)   Resp 16   Ht 6' (1.829 m)   Wt 244 lb (110.7 kg)   SpO2 97%   BMI 33.09 kg/m  Wt Readings from Last 3 Encounters:  03/08/16 244 lb (110.7 kg)  12/29/15 237 lb (107.5 kg)  12/20/15 240 lb (108.9 kg)   General Appearance: Well nourished, in no apparent distress. Eyes: PERRLA, EOMs, conjunctiva no swelling or erythema Sinuses: no Frontal/maxillary  tenderness ENT/Mouth: Ext aud canals clear, TMs without erythema, bulging. No erythema, swelling, or exudate on post pharynx.  Tonsils not swollen or erythematous. Hearing normal.  Neck: Supple, thyroid normal.  Respiratory: Respiratory effort normal, BS equal bilaterally without rales, rhonchi, wheezing or stridor.  Cardio: RRR with no MRGs. Brisk peripheral pulses without edema.  Abdomen: Soft, + BS.  Non tender, no guarding, rebound, hernias, masses. Lymphatics: Non tender without lymphadenopathy.  Musculoskeletal: Full ROM, 5/5 strength, Normal gait Skin: Warm, dry without rashes, lesions, ecchymosis.  Neuro: Cranial nerves intact. No cerebellar symptoms.  Psych: Awake and oriented X 3, normal affect, Insight and Judgment appropriate.    Quentin MullingAmanda Telesia Ates, PA-C 10:32 AM [facility]

## 2016-03-08 NOTE — Consult Note (Signed)
Patient presents for testosterone injection for hypogonadism Tx.  Patient received 2 cc IM LEFT glute per pt's request and tolerated well.  Patient will RTC in 2 weeks for next injection

## 2016-03-09 LAB — BASIC METABOLIC PANEL WITH GFR
BUN: 16 mg/dL (ref 7–25)
CALCIUM: 9 mg/dL (ref 8.6–10.3)
CHLORIDE: 104 mmol/L (ref 98–110)
CO2: 23 mmol/L (ref 20–31)
CREATININE: 1.18 mg/dL (ref 0.70–1.25)
GFR, Est African American: 77 mL/min (ref >=60)
GFR, Est Non African American: 67 mL/min (ref >=60)
Glucose, Bld: 147 mg/dL — ABNORMAL HIGH (ref 65–99)
Potassium: 4.3 mmol/L (ref 3.5–5.3)
SODIUM: 139 mmol/L (ref 135–146)

## 2016-03-09 LAB — HEPATIC FUNCTION PANEL
ALBUMIN: 4.2 g/dL (ref 3.6–5.1)
ALT: 32 U/L (ref 9–46)
AST: 24 U/L (ref 10–35)
Alkaline Phosphatase: 76 U/L (ref 40–115)
BILIRUBIN TOTAL: 0.4 mg/dL (ref 0.2–1.2)
Bilirubin, Direct: 0.1 mg/dL (ref <=0.2)
Indirect Bilirubin: 0.3 mg/dL (ref 0.2–1.2)
Total Protein: 6.8 g/dL (ref 6.1–8.1)

## 2016-03-09 LAB — TESTOSTERONE: Testosterone: 179 ng/dL — ABNORMAL LOW (ref 250–827)

## 2016-03-09 LAB — LIPID PANEL
Cholesterol: 213 mg/dL — ABNORMAL HIGH (ref <200)
HDL: 36 mg/dL — AB (ref >40)
LDL CALC: 116 mg/dL — AB (ref <100)
TRIGLYCERIDES: 304 mg/dL — AB (ref <150)
Total CHOL/HDL Ratio: 5.9 Ratio — ABNORMAL HIGH (ref <5.0)
VLDL: 61 mg/dL — AB (ref <30)

## 2016-03-09 LAB — VITAMIN D 25 HYDROXY (VIT D DEFICIENCY, FRACTURES): Vit D, 25-Hydroxy: 23 ng/mL — ABNORMAL LOW (ref 30–100)

## 2016-03-09 LAB — HEMOGLOBIN A1C
Hgb A1c MFr Bld: 6.4 % — ABNORMAL HIGH (ref <5.7)
Mean Plasma Glucose: 137 mg/dL

## 2016-03-09 LAB — MAGNESIUM: Magnesium: 2 mg/dL (ref 1.5–2.5)

## 2016-03-22 ENCOUNTER — Ambulatory Visit (INDEPENDENT_AMBULATORY_CARE_PROVIDER_SITE_OTHER): Payer: BC Managed Care – PPO | Admitting: *Deleted

## 2016-03-22 DIAGNOSIS — E291 Testicular hypofunction: Secondary | ICD-10-CM | POA: Diagnosis not present

## 2016-03-22 MED ORDER — TESTOSTERONE CYPIONATE 200 MG/ML IM SOLN
400.0000 mg | Freq: Once | INTRAMUSCULAR | Status: AC
  Administered 2016-03-22: 400 mg via INTRAMUSCULAR

## 2016-03-22 NOTE — Progress Notes (Signed)
Patient here for NV for administration of Testosterone Cypionate 200 mg/ml 2 ml IM in left upper outer quadrant. Tolerated well.

## 2016-04-02 ENCOUNTER — Other Ambulatory Visit: Payer: Self-pay | Admitting: Physician Assistant

## 2016-04-24 ENCOUNTER — Ambulatory Visit (INDEPENDENT_AMBULATORY_CARE_PROVIDER_SITE_OTHER): Payer: BC Managed Care – PPO | Admitting: *Deleted

## 2016-04-24 DIAGNOSIS — E291 Testicular hypofunction: Secondary | ICD-10-CM | POA: Diagnosis not present

## 2016-04-24 NOTE — Progress Notes (Signed)
Patient presents for testosterone injection for hypogonadism Tx.  Patient received 2 cc IM right glut and tolerated well.  Will RTC in 2-3 weeks for next injection.  

## 2016-06-05 ENCOUNTER — Ambulatory Visit (INDEPENDENT_AMBULATORY_CARE_PROVIDER_SITE_OTHER): Payer: BC Managed Care – PPO | Admitting: *Deleted

## 2016-06-05 DIAGNOSIS — E291 Testicular hypofunction: Secondary | ICD-10-CM

## 2016-06-05 NOTE — Progress Notes (Signed)
Patient presents for testosterone injection for hypogonadism Tx.  Patient received 2 cc IM left glut and tolerated well.  Will RTC in 2-3 weeks for next injection.  

## 2016-06-07 ENCOUNTER — Ambulatory Visit: Payer: Self-pay | Admitting: Physician Assistant

## 2016-07-24 ENCOUNTER — Other Ambulatory Visit: Payer: Self-pay | Admitting: *Deleted

## 2016-07-24 ENCOUNTER — Other Ambulatory Visit: Payer: Self-pay | Admitting: Internal Medicine

## 2016-07-24 NOTE — Telephone Encounter (Signed)
Testosterone called into pharmacy on 30th April 2018 @ 4:17pm by DD

## 2016-07-24 NOTE — Telephone Encounter (Signed)
P[lease call Depo Test 

## 2016-07-26 ENCOUNTER — Ambulatory Visit (INDEPENDENT_AMBULATORY_CARE_PROVIDER_SITE_OTHER): Payer: BC Managed Care – PPO | Admitting: *Deleted

## 2016-07-26 DIAGNOSIS — E291 Testicular hypofunction: Secondary | ICD-10-CM

## 2016-07-26 MED ORDER — TESTOSTERONE CYPIONATE 200 MG/ML IM SOLN
400.0000 mg | Freq: Once | INTRAMUSCULAR | Status: AC
  Administered 2016-07-26: 400 mg via INTRAMUSCULAR

## 2016-07-26 NOTE — Progress Notes (Signed)
Patient here for a NV for Testosterone Cypionate 200 mg/ml 2 ml IM in right upper outer quadrant.

## 2016-08-01 ENCOUNTER — Encounter: Payer: Self-pay | Admitting: Physician Assistant

## 2016-08-14 ENCOUNTER — Encounter: Payer: Self-pay | Admitting: Physician Assistant

## 2016-08-14 ENCOUNTER — Encounter: Payer: Self-pay | Admitting: Internal Medicine

## 2016-08-14 ENCOUNTER — Ambulatory Visit (INDEPENDENT_AMBULATORY_CARE_PROVIDER_SITE_OTHER): Payer: BC Managed Care – PPO | Admitting: Internal Medicine

## 2016-08-14 VITALS — BP 144/88 | HR 72 | Temp 97.5°F | Resp 16 | Ht 72.0 in | Wt 236.0 lb

## 2016-08-14 DIAGNOSIS — E291 Testicular hypofunction: Secondary | ICD-10-CM

## 2016-08-14 DIAGNOSIS — J0100 Acute maxillary sinusitis, unspecified: Secondary | ICD-10-CM

## 2016-08-14 MED ORDER — PREDNISONE 20 MG PO TABS: ORAL_TABLET | ORAL | 0 refills | Status: DC

## 2016-08-14 MED ORDER — TESTOSTERONE CYPIONATE 200 MG/ML IM SOLN
400.0000 mg | Freq: Once | INTRAMUSCULAR | Status: AC
  Administered 2016-08-14: 400 mg via INTRAMUSCULAR

## 2016-08-14 MED ORDER — LEVOFLOXACIN 500 MG PO TABS: ORAL_TABLET | ORAL | 1 refills | Status: DC

## 2016-08-14 NOTE — Progress Notes (Signed)
  Subjective:    Patient ID: Mark [name], male    DOB: 16-May-1955, 61 y.o.   MRN: [id number]  HPI  Patient is a nice 61 yo MWM w/hx/o HTN and T2_DM and Low T presenting with c/o sinus congestion and mucopurulent drainage. Denies fever, chills, sweats, rigors, rigors, or chest congestion or cough.     Patient's BP is sl elevated today and he reveals that he self d/c'd both his Ziac and Metformin.   Medication Sig  . aspirin 81 MG chewable tablet Chew by mouth daily.  Marland Kitchen. doxazosin (CARDURA) 1 MG tablet TAKE ONE TABLET BY MOUTH ONCE DAILY  . ranitidine (ZANTAC) 300 MG tablet TAKE ONE TABLET BY MOUTH ONCE DAILY FOR ACID REFLUX  . testosterone cypionate (DEPOTESTOSTERONE CYPIONATE) 200 MG/ML injection INJECT TWO ML (CC) INTRAMUSCULARLY ONCE EVERY 2 WEEKS  . bisoprolol-hydrochlorothiazide (ZIAC) 5-6.25 MG tablet TAKE ONE TABLET BY MOUTH ONCE DAILY FOR BLOOD PRESSURE (Patient not taking: Reported on 08/14/2016)  . metFORMIN (GLUCOPHAGE-XR) 500 MG 24 hr tablet Take 2 tablets (1,000 mg total) by mouth 2 (two) times daily. (Patient not taking: Reported on 08/14/2016)  . pravastatin (PRAVACHOL) 20 MG tablet Take 1 tablet (20 mg total) by mouth every evening.  . tamsulosin (FLOMAX) 0.4 MG CAPS capsule Take 1 capsule (0.4 mg total) by mouth at bedtime. (Patient not taking: Reported on 08/14/2016)  . HYDROcodone-acetaminophen (NORCO/VICODIN) 5-325 MG tablet Take 1 tablet by mouth every 6 (six) hours as needed for moderate pain.   No facility-administered medications prior to visit.    Allergies  Allergen Reactions  . Codeine     REACTION: Anxiety   Past Medical History:  Diagnosis Date  . GERD (gastroesophageal reflux disease)   . Hyperlipidemia   . Hypertension   . Other testicular hypofunction   . Prediabetes   . Vitamin D deficiency    Past Surgical History:  Procedure Laterality Date  . URETHRAL DILATION  2007  . VASECTOMY  01/2004   Review of Systems  10 point systems review negative  except as above.    Objective:   Physical Exam  BP144/88   P 72   T 97.5 F    R 16   Ht 6'   Wt 236 lb   BMI 32.01   In no distress. Skin is clear.   HEENT - WNL except tender Lt maxillary area. Neck - supple.  Chest - Clear equal BS. Cor - Nl HS. RRR w/o sig MGR. PP 1(+). No edema. MS- FROM w/o deformities.  Gait Nl. Neuro -  Nl w/o focal abnormalities.    Assessment & Plan:   1. Acute non-recurrent maxillary sinusitis  - levofloxacin  500 MG tab; Take 1 tabl daily with food for infection  Disp: 15 tab; Rf: 1  - predniSONE  20 MG tab; 1 tab 3 x day for 2 days, then 1 tab 2 x day for 2 days, then 1 tab 1 x day for 3 days  Disp: 13 tab  2. Hypogonadism in male  - testosterone cypionate (DEPOTESTOSTERONE CYPIONATE) injection 400 mg; Inject 2 mLs (400 mg total) into the muscle once.  3. HTN  - encouraged to check BP's and restart Ziac 1/2 tab if BP remained elevated.  4. T2_DM  - encouraged to monitor his CBG's and if elevated to restart his Metformin.

## 2016-08-14 NOTE — Patient Instructions (Signed)

## 2016-08-15 ENCOUNTER — Other Ambulatory Visit: Payer: Self-pay | Admitting: Internal Medicine

## 2016-08-15 DIAGNOSIS — J0100 Acute maxillary sinusitis, unspecified: Secondary | ICD-10-CM

## 2016-08-15 MED ORDER — AZITHROMYCIN 250 MG PO TABS: ORAL_TABLET | ORAL | 1 refills | Status: DC

## 2016-09-05 ENCOUNTER — Ambulatory Visit (INDEPENDENT_AMBULATORY_CARE_PROVIDER_SITE_OTHER): Payer: BC Managed Care – PPO | Admitting: *Deleted

## 2016-09-05 DIAGNOSIS — E291 Testicular hypofunction: Secondary | ICD-10-CM

## 2016-09-05 MED ORDER — TESTOSTERONE CYPIONATE 200 MG/ML IM SOLN
400.0000 mg | Freq: Once | INTRAMUSCULAR | Status: AC
  Administered 2016-09-05: 400 mg via INTRAMUSCULAR

## 2016-09-05 NOTE — Progress Notes (Signed)
Patient here for a NV for Testosterone Cypionate injection 200 mg/ml 2 ml IM in right upper outer quadrant

## 2016-09-25 ENCOUNTER — Ambulatory Visit: Payer: Self-pay | Admitting: Internal Medicine

## 2016-10-02 NOTE — Progress Notes (Deleted)
Complete Physical  Assessment and Plan: 1. Male hypogonadism He would like to try to switch to testim, printed out RX for him - testosterone cypionate (DEPOTESTOTERONE CYPIONATE) injection 400 mg; Inject 2 mLs (400 mg total) into the muscle once. - Testosterone  2. Essential hypertension - continue medications, DASH diet, exercise and monitor at home. Call if greater than 130/80.  - CBC with Differential/Platelet - Hepatic function panel - TSH - Urinalysis, Routine w reflex microscopic - Microalbumin / creatinine urine ratio - EKG 12-Lead - will switch from ziac to doxazosin  3. Hyperlipidemia -continue medications, check lipids, decrease fatty foods, increase activity.  Not at goal, goal is LDL less than 70 - Lipid panel  4. CKD stage 2 due to type 2 diabetes mellitus Discussed general issues about diabetes pathophysiology and management., Educational material distributed., Suggested low cholesterol diet., Encouraged aerobic exercise., Discussed foot care., Reminded to get yearly retinal exam. - BASIC METABOLIC PANEL WITH GFR - Hemoglobin A1c - Insulin, fasting - HM DIABETES FOOT EXAM  5. Obesity Obesity with co morbidities- long discussion about weight loss, diet, and exercise  6. Gastroesophageal reflux disease without esophagitis Continue PPI/H2 blocker, diet discussed  7. Vitamin D deficiency - Vit D  25 hydroxy (rtn osteoporosis monitoring)  8. Medication management - Magnesium  9. Prostate cancer screening - PSA  10. BPH - continue doxazosin 1/4-1/2 or 1-2mg   Discussed med's effects and SE's. Screening labs and tests as requested with regular follow-up as recommended. Moderate- high complexity decision making was done during the course of this visit.   HPI Patient presents for complete physical.   Patient's blood pressure has been controlled at home. Patient denies chest pain, shortness of breath, dizziness.    Patient's cholesterol is diet controlled. Off  zetia and pravachol, wants to get back on it. The patient's cholesterol last visit was: Lab Results  Component Value Date   CHOL 213 (H) 03/08/2016   HDL 36 (L) 03/08/2016   LDLCALC 116 (H) 03/08/2016   TRIG 304 (H) 03/08/2016   CHOLHDL 5.9 (H) 03/08/2016  CPK was 244.  Lab Results  Component Value Date   CKTOTAL 132 09/15/2015  He has a history of testosterone deficiency and is on testosterone replacement. He states that the testosterone helps with his energy, libido, muscle mass. Lab Results  Component Value Date   TESTOSTERONE 179 (L) 03/08/2016  He is on supplement, 1000 IU Last Vitamin D was Lab Results  Component Value Date   VD25OH 10 (L) 03/08/2016   He has been working on diet and exercise for Diabetes with diabetic chronic kidney disease stage 2 (GFR 76 and 83), his A1C has been in the DM range for the last year, he is on metformin he is on bASA, he is not on ACE/ARB, and denies paresthesia of the feet, polydipsia, polyuria and visual disturbances. Last A1C was:  Lab Results  Component Value Date   HGBA1C 6.4 (H) 03/08/2016   He has frequent urination, started on doxazosin and states it is helping considerably, declines seeing urology.  BMI is There is no height or weight on file to calculate BMI., he is working on diet and exercise. Wt Readings from Last 3 Encounters:  08/14/16 236 lb (107 kg)  03/08/16 244 lb (110.7 kg)  12/29/15 237 lb (107.5 kg)    Current Medications:  Current Outpatient Prescriptions on File Prior to Visit  Medication Sig Dispense Refill  . aspirin 81 MG chewable tablet Chew by mouth daily.    Marland Kitchen  azithromycin (ZITHROMAX) 250 MG tablet Take 2 tablets (500 mg) on  Day 1,  followed by 1 tablet (250 mg) once daily on Days 2 through 5. 6 each 1  . bisoprolol-hydrochlorothiazide (ZIAC) 5-6.25 MG tablet TAKE ONE TABLET BY MOUTH ONCE DAILY FOR BLOOD PRESSURE (Patient not taking: Reported on 08/14/2016) 90 tablet 0  . doxazosin (CARDURA) 1 MG tablet TAKE  ONE TABLET BY MOUTH ONCE DAILY 90 tablet 0  . metFORMIN (GLUCOPHAGE-XR) 500 MG 24 hr tablet Take 2 tablets (1,000 mg total) by mouth 2 (two) times daily. (Patient not taking: Reported on 08/14/2016) 120 tablet 0  . pravastatin (PRAVACHOL) 20 MG tablet Take 1 tablet (20 mg total) by mouth every evening. 30 tablet 11  . predniSONE (DELTASONE) 20 MG tablet 1 tab 3 x day for 2 days, then 1 tab 2 x day for 2 days, then 1 tab 1 x day for 3 days 13 tablet 0  . ranitidine (ZANTAC) 300 MG tablet TAKE ONE TABLET BY MOUTH ONCE DAILY FOR ACID REFLUX 90 tablet 1  . tamsulosin (FLOMAX) 0.4 MG CAPS capsule Take 1 capsule (0.4 mg total) by mouth at bedtime. (Patient not taking: Reported on 08/14/2016) 30 capsule 3  . testosterone cypionate (DEPOTESTOSTERONE CYPIONATE) 200 MG/ML injection INJECT TWO ML (CC) INTRAMUSCULARLY ONCE EVERY 2 WEEKS 10 mL 0   No current facility-administered medications on file prior to visit.    Health Maintenance:   Immunization History  Administered Date(s) Administered  . Td 03/27/2005  . Tdap 08/09/2015   Tetanus: 2007 DUE this year Pneumovax: 2013 Prevnar 13: due age 89 Flu vaccine: N/A Zostavax:N/A DEXA: N/A Colonoscopy: 10/2009 due 10 years EGD:N/A Ct chest 2005 CXR 2007 Stress test 08/2005  Medical History:  Past Medical History:  Diagnosis Date  . GERD (gastroesophageal reflux disease)   . Hyperlipidemia   . Hypertension   . Other testicular hypofunction   . Prediabetes   . Vitamin D deficiency    Allergies Allergies  Allergen Reactions  . Codeine     REACTION: Anxiety    SURGICAL HISTORY He  has a past surgical history that includes Vasectomy (01/2004) and Urethral dilation (2007). FAMILY HISTORY His family history includes COPD in his father; Heart disease in his father; Hyperlipidemia in his mother; Hypertension in his father and mother. SOCIAL HISTORY He  reports that he has never smoked. He has never used smokeless tobacco. He reports that he  does not drink alcohol or use drugs.   Review of Systems  Constitutional: Negative.   HENT: Negative for congestion, ear discharge, ear pain, hearing loss, nosebleeds, sore throat and tinnitus.   Eyes: Negative for blurred vision, double vision, photophobia, pain, discharge and redness.  Respiratory: Negative.  Negative for stridor.   Cardiovascular: Negative.  Negative for chest pain.  Gastrointestinal: Negative.   Genitourinary: Negative for dysuria, flank pain, frequency, hematuria and urgency.  Musculoskeletal: Negative.   Skin: Negative.   Neurological: Negative.  Negative for headaches.  Psychiatric/Behavioral: Negative.  Negative for depression. The patient is not nervous/anxious.    Physical Exam: Estimated body mass index is 32.01 kg/m as calculated from the following:   Height as of 08/14/16: 6' (1.829 m).   Weight as of 08/14/16: 236 lb (107 kg). There were no vitals filed for this visit. General Appearance: Well nourished, in no apparent distress. Eyes: PERRLA, EOMs, conjunctiva no swelling or erythema, normal fundi and vessels. Sinuses: No Frontal/maxillary tenderness ENT/Mouth: Ext aud canals clear, normal light reflex with TMs  without erythema, bulging. Good dentition. No erythema, swelling, or exudate on post pharynx. Tonsils not swollen or erythematous. Hearing normal.  Neck: Supple, thyroid normal. No bruits Respiratory: Respiratory effort normal, BS equal bilaterally without rales, rhonchi, wheezing or stridor. Cardio: RRR without murmurs, rubs or gallops. Brisk peripheral pulses without edema.  Chest: symmetric, with normal excursions and percussion. Abdomen: Soft, +BS. Non tender, no guarding, rebound, hernias, masses, or organomegaly. .  Lymphatics: Non tender without lymphadenopathy.  Genitourinary: defer did last visit, enlarged.  Musculoskeletal: Full ROM all peripheral extremities,5/5 strength, and normal gait. Skin: Warm, dry without rashes, lesions,  ecchymosis.  Neuro: Cranial nerves intact, reflexes equal bilaterally. Normal muscle tone, no cerebellar symptoms. Sensation intact.  Psych: Awake and oriented X 3, normal affect, Insight and Judgment appropriate.   EKG: WNL no changes. Aorta scan: WNL  Quentin Mulling 2:33 PM

## 2016-10-04 ENCOUNTER — Encounter: Payer: Self-pay | Admitting: Physician Assistant

## 2016-10-04 ENCOUNTER — Ambulatory Visit (INDEPENDENT_AMBULATORY_CARE_PROVIDER_SITE_OTHER): Payer: BC Managed Care – PPO | Admitting: Physician Assistant

## 2016-10-04 VITALS — BP 140/80 | HR 77 | Temp 97.6°F | Resp 14 | Ht 72.5 in | Wt 234.2 lb

## 2016-10-04 DIAGNOSIS — Z0000 Encounter for general adult medical examination without abnormal findings: Secondary | ICD-10-CM | POA: Diagnosis not present

## 2016-10-04 DIAGNOSIS — E1122 Type 2 diabetes mellitus with diabetic chronic kidney disease: Secondary | ICD-10-CM

## 2016-10-04 DIAGNOSIS — E349 Endocrine disorder, unspecified: Secondary | ICD-10-CM

## 2016-10-04 DIAGNOSIS — Z125 Encounter for screening for malignant neoplasm of prostate: Secondary | ICD-10-CM | POA: Diagnosis not present

## 2016-10-04 DIAGNOSIS — K219 Gastro-esophageal reflux disease without esophagitis: Secondary | ICD-10-CM

## 2016-10-04 DIAGNOSIS — N401 Enlarged prostate with lower urinary tract symptoms: Secondary | ICD-10-CM

## 2016-10-04 DIAGNOSIS — Z79899 Other long term (current) drug therapy: Secondary | ICD-10-CM

## 2016-10-04 DIAGNOSIS — Z136 Encounter for screening for cardiovascular disorders: Secondary | ICD-10-CM | POA: Diagnosis not present

## 2016-10-04 DIAGNOSIS — R350 Frequency of micturition: Secondary | ICD-10-CM

## 2016-10-04 DIAGNOSIS — Z0001 Encounter for general adult medical examination with abnormal findings: Secondary | ICD-10-CM

## 2016-10-04 DIAGNOSIS — I10 Essential (primary) hypertension: Secondary | ICD-10-CM | POA: Diagnosis not present

## 2016-10-04 DIAGNOSIS — Z1159 Encounter for screening for other viral diseases: Secondary | ICD-10-CM

## 2016-10-04 DIAGNOSIS — N182 Chronic kidney disease, stage 2 (mild): Secondary | ICD-10-CM

## 2016-10-04 DIAGNOSIS — E785 Hyperlipidemia, unspecified: Secondary | ICD-10-CM

## 2016-10-04 DIAGNOSIS — D649 Anemia, unspecified: Secondary | ICD-10-CM

## 2016-10-04 DIAGNOSIS — E559 Vitamin D deficiency, unspecified: Secondary | ICD-10-CM

## 2016-10-04 LAB — BASIC METABOLIC PANEL WITH GFR
BUN: 17 mg/dL (ref 7–25)
CHLORIDE: 100 mmol/L (ref 98–110)
CO2: 24 mmol/L (ref 20–31)
CREATININE: 1.03 mg/dL (ref 0.70–1.25)
Calcium: 9.3 mg/dL (ref 8.6–10.3)
GFR, Est African American: >89 mL/min (ref >=60)
GFR, Est Non African American: 78 mL/min (ref >=60)
GLUCOSE: 224 mg/dL — AB (ref 65–99)
POTASSIUM: 4.3 mmol/L (ref 3.5–5.3)
Sodium: 136 mmol/L (ref 135–146)

## 2016-10-04 LAB — CBC WITH DIFFERENTIAL/PLATELET
BASOS ABS: 0 {cells}/uL (ref 0–200)
BASOS PCT: 0 %
EOS ABS: 130 {cells}/uL (ref 15–500)
Eosinophils Relative: 2 %
HEMATOCRIT: 47.2 % (ref 38.5–50.0)
HEMOGLOBIN: 15.4 g/dL (ref 13.2–17.1)
Lymphocytes Relative: 30 %
Lymphs Abs: 1950 cells/uL (ref 850–3900)
MCH: 29.5 pg (ref 27.0–33.0)
MCHC: 32.6 g/dL (ref 32.0–36.0)
MCV: 90.4 fL (ref 80.0–100.0)
MONO ABS: 455 {cells}/uL (ref 200–950)
MONOS PCT: 7 %
MPV: 10.9 fL (ref 7.5–12.5)
NEUTROS ABS: 3965 {cells}/uL (ref 1500–7800)
Neutrophils Relative %: 61 %
PLATELETS: 221 10*3/uL (ref 140–400)
RBC: 5.22 MIL/uL (ref 4.20–5.80)
RDW: 12.9 % (ref 11.0–15.0)
WBC: 6.5 10*3/uL (ref 3.8–10.8)

## 2016-10-04 LAB — HEPATIC FUNCTION PANEL
ALBUMIN: 4.4 g/dL (ref 3.6–5.1)
ALK PHOS: 114 U/L (ref 40–115)
ALT: 23 U/L (ref 9–46)
AST: 18 U/L (ref 10–35)
Bilirubin, Direct: 0.1 mg/dL (ref <=0.2)
Indirect Bilirubin: 0.2 mg/dL (ref 0.2–1.2)
TOTAL PROTEIN: 7.3 g/dL (ref 6.1–8.1)
Total Bilirubin: 0.3 mg/dL (ref 0.2–1.2)

## 2016-10-04 LAB — LIPID PANEL
Cholesterol: 222 mg/dL — ABNORMAL HIGH (ref <200)
HDL: 36 mg/dL — AB (ref >40)
LDL CALC: 118 mg/dL — AB (ref <100)
Total CHOL/HDL Ratio: 6.2 Ratio — ABNORMAL HIGH (ref <5.0)
Triglycerides: 339 mg/dL — ABNORMAL HIGH (ref <150)
VLDL: 68 mg/dL — ABNORMAL HIGH (ref <30)

## 2016-10-04 LAB — IRON AND TIBC
%SAT: 29 % (ref 15–60)
IRON: 79 ug/dL (ref 50–180)
TIBC: 273 ug/dL (ref 250–425)
UIBC: 194 ug/dL

## 2016-10-04 LAB — TSH: TSH: 2.13 m[IU]/L (ref 0.40–4.50)

## 2016-10-04 MED ORDER — TAMSULOSIN HCL 0.4 MG PO CAPS: 0.4000 mg | ORAL_CAPSULE | Freq: Every day | ORAL | 1 refills | Status: DC

## 2016-10-04 NOTE — Progress Notes (Signed)
Complete Physical  Assessment and Plan: Male hypogonadism He would like to try to switch to testim, printed out RX for him - testosterone cypionate (DEPOTESTOTERONE CYPIONATE) injection 400 mg; Inject 2 mLs (400 mg total) into the muscle once. - Testosterone   Essential hypertension - continue medications, DASH diet, exercise and monitor at home. Call if greater than 130/80.  - CBC with Differential/Platelet - Hepatic function panel - TSH - Urinalysis, Routine w reflex microscopic - Microalbumin / creatinine urine ratio - EKG 12-Lead - will switch from ziac to doxazosin   Hyperlipidemia -continue medications, check lipids, decrease fatty foods, increase activity.  Not at goal, goal is LDL less than 70 - Lipid panel  CKD stage 2 due to type 2 diabetes mellitus Discussed general issues about diabetes pathophysiology and management., Educational material distributed., Suggested low cholesterol diet., Encouraged aerobic exercise., Discussed foot care., Reminded to get yearly retinal exam. - BASIC METABOLIC PANEL WITH GFR - Hemoglobin A1c - Insulin, fasting - HM DIABETES FOOT EXAM  Obesity Obesity with co morbidities- long discussion about weight loss, diet, and exercise  Gastroesophageal reflux disease without esophagitis Continue PPI/H2 blocker, diet discussed  Vitamin D deficiency - Vit D  25 hydroxy (rtn osteoporosis monitoring)  Medication management - Magnesium  Prostate cancer screening - PSA  BPH - continue flomax  Discussed med's effects and SE's. Screening labs and tests as requested with regular follow-up as recommended. Moderate- high complexity decision making was done during the course of this visit.   HPI Patient presents for complete physical.   Patient's blood pressure has been controlled at home, has not taken BP at home. Patient denies chest pain, shortness of breath, dizziness. BP: 140/80  Patient's cholesterol is diet controlled and he is on  pravachol, does have history of elevated CPK in the past, will monitor, denies myalgias. The patient's cholesterol last visit was: Lab Results  Component Value Date   CHOL 213 (H) 03/08/2016   HDL 36 (L) 03/08/2016   LDLCALC 116 (H) 03/08/2016   TRIG 304 (H) 03/08/2016   CHOLHDL 5.9 (H) 03/08/2016  .  Lab Results  Component Value Date   CKTOTAL 132 09/15/2015   He has a history of testosterone deficiency and is on testosterone replacement. He states that the testosterone helps with his energy, libido, muscle mass. Lab Results  Component Value Date   TESTOSTERONE 179 (L) 03/08/2016   He is not on vitamin D Lab Results  Component Value Date   VD25OH 23 (L) 03/08/2016   He has been working on diet and exercise for Diabetes with diabetic chronic kidney disease , has done a good job with diet to decrease his A1C, he is on metformin he is on bASA, he is not on ACE/ARB, and denies paresthesia of the feet, polydipsia, polyuria and visual disturbances. Last A1C was:  Lab Results  Component Value Date   HGBA1C 6.4 (H) 03/08/2016    Lab Results  Component Value Date   GFRNONAA 67 03/08/2016   He has frequent urination, flomax helps.  BMI is Body mass index is 31.33 kg/m., he is working on diet and exercise. Wt Readings from Last 3 Encounters:  10/04/16 234 lb 3.2 oz (106.2 kg)  08/14/16 236 lb (107 kg)  03/08/16 244 lb (110.7 kg)    Current Medications:  Current Outpatient Prescriptions on File Prior to Visit  Medication Sig Dispense Refill  . aspirin 81 MG chewable tablet Chew by mouth daily.    . bisoprolol-hydrochlorothiazide [facility](ZIAC) 5-6.25  MG tablet TAKE ONE TABLET BY MOUTH ONCE DAILY FOR BLOOD PRESSURE 90 tablet 0  . doxazosin (CARDURA) 1 MG tablet TAKE ONE TABLET BY MOUTH ONCE DAILY 90 tablet 0  . metFORMIN (GLUCOPHAGE-XR) 500 MG 24 hr tablet Take 2 tablets (1,000 mg total) by mouth 2 (two) times daily. 120 tablet 0  . ranitidine (ZANTAC) 300 MG tablet TAKE ONE TABLET BY  MOUTH ONCE DAILY FOR ACID REFLUX 90 tablet 1  . tamsulosin (FLOMAX) 0.4 MG CAPS capsule Take 1 capsule (0.4 mg total) by mouth at bedtime. 30 capsule 3  . testosterone cypionate (DEPOTESTOSTERONE CYPIONATE) 200 MG/ML injection INJECT TWO ML (CC) INTRAMUSCULARLY ONCE EVERY 2 WEEKS 10 mL 0  . pravastatin (PRAVACHOL) 20 MG tablet Take 1 tablet (20 mg total) by mouth every evening. 30 tablet 11   No current facility-administered medications on file prior to visit.    Health Maintenance:   Immunization History  Administered Date(s) Administered  . Td 03/27/2005  . Tdap 08/09/2015   Tetanus: 2017 Pneumovax: 2013 Prevnar 13: due age 16 Flu vaccine: N/A Zostavax:N/A  DEXA: N/A Colonoscopy: 10/2009 due 10 years EGD:N/A Ct chest 2005 CXR 2007 Stress test 08/2005  Medical History:  Past Medical History:  Diagnosis Date  . GERD (gastroesophageal reflux disease)   . Hyperlipidemia   . Hypertension   . Other testicular hypofunction   . Prediabetes   . Vitamin D deficiency    Allergies Allergies  Allergen Reactions  . Codeine     REACTION: Anxiety    SURGICAL HISTORY He  has a past surgical history that includes Vasectomy (01/2004) and Urethral dilation (2007). FAMILY HISTORY His family history includes COPD in his father; Heart disease in his father; Hyperlipidemia in his mother; Hypertension in his father and mother. SOCIAL HISTORY He  reports that he has never smoked. He has never used smokeless tobacco. He reports that he does not drink alcohol or use drugs.  Review of Systems  Constitutional: Negative.   HENT: Negative for congestion, ear discharge, ear pain, hearing loss, nosebleeds, sore throat and tinnitus.   Eyes: Negative for blurred vision, double vision, photophobia, pain, discharge and redness.  Respiratory: Negative.  Negative for stridor.   Cardiovascular: Negative.  Negative for chest pain.  Gastrointestinal: Negative.   Genitourinary: Negative for dysuria,  flank pain, frequency, hematuria and urgency.  Musculoskeletal: Negative.   Skin: Negative.   Neurological: Negative.  Negative for headaches.  Psychiatric/Behavioral: Negative.  Negative for depression. The patient is not nervous/anxious.    Physical Exam: Estimated body mass index is 31.33 kg/m as calculated from the following:   Height as of this encounter: 6' 0.5" (1.842 m).   Weight as of this encounter: 234 lb 3.2 oz (106.2 kg). Vitals:   10/04/16 1008  BP: 140/80  Pulse: 77  Resp: 14  Temp: 97.6 F (36.4 C)   General Appearance: Well nourished, in no apparent distress. Eyes: PERRLA, EOMs, conjunctiva no swelling or erythema, normal fundi and vessels. Sinuses: No Frontal/maxillary tenderness ENT/Mouth: Ext aud canals clear, normal light reflex with TMs without erythema, bulging. Good dentition. No erythema, swelling, or exudate on post pharynx. Tonsils not swollen or erythematous. Hearing normal.  Neck: Supple, thyroid normal. No bruits Respiratory: Respiratory effort normal, BS equal bilaterally without rales, rhonchi, wheezing or stridor. Cardio: RRR without murmurs, rubs or gallops. Brisk peripheral pulses without edema.  Chest: symmetric, with normal excursions and percussion. Abdomen: Soft, +BS. Non tender, no guarding, rebound, hernias, masses, or organomegaly. Marland Kitchen  Lymphatics: Non tender without lymphadenopathy.  Genitourinary: defer did last visit, enlarged.  Musculoskeletal: Full ROM all peripheral extremities,5/5 strength, and normal gait. Skin: Warm, dry without rashes, lesions, ecchymosis.  Neuro: Cranial nerves intact, reflexes equal bilaterally. Normal muscle tone, no cerebellar symptoms. Sensation intact.  Psych: Awake and oriented X 3, normal affect, Insight and Judgment appropriate.   EKG: WNL no changes. Aorta scan: WNL  Quentin Mulling 10:24 AM

## 2016-10-04 NOTE — Patient Instructions (Addendum)
Use a dropper or use a cap to put olive oil,mineral oil or canola oil in the effected ear- 2-3 times a week. Let it soak for 20-30 min then you can take a shower or use a baby bulb with warm water to wash out the ear wax.  Do not use Qtips  Your A1C is a measure of your sugar over the past 3 months and is not affected by what you have eaten over the past few days. Diabetes increases your chances of stroke and heart attack over 300 % and is the leading cause of blindness and kidney failure in the [place]. Please make sure you decrease bad carbs like white bread, white rice, potatoes, corn, soft drinks, pasta, cereals, refined sugars, sweet tea, dried fruits, and fruit juice. Good carbs are okay to eat in moderation like sweet potatoes, brown rice, whole grain pasta/bread, most fruit (except dried fruit) and you can eat as many veggies as you want.   Greater than 6.5 is considered diabetic. Between 6.4 and 5.7 is prediabetic If your A1C is less than 5.7 you are NOT diabetic.  Targets for Glucose Readings: Time of Check Target for patients WITHOUT Diabetes Target for DIABETICS  Before Meals Less than 100  less than 150  Two hours after meals Less than 200  Less than 250     Monitor your blood pressure at home. Go to the ER if any CP, SOB, nausea, dizziness, severe HA, changes vision/speech  Goal BP:  For patients younger than 60: Goal BP < 140/90. For patients 60 and older: Goal BP < 150/90. For patients with diabetes: Goal BP < 140/90. Your most recent BP: BP: 140/80   Take your medications faithfully as instructed. Maintain a healthy weight. Get at least 150 minutes of aerobic exercise per week. Minimize salt intake. Minimize alcohol intake  DASH Eating Plan DASH stands for "Dietary Approaches to Stop Hypertension." The DASH eating plan is a healthy eating plan that has been shown to reduce high blood pressure (hypertension). Additional health benefits may include reducing the  risk of type 2 diabetes mellitus, heart disease, and stroke. The DASH eating plan may also help with weight loss. WHAT DO I NEED TO KNOW ABOUT THE DASH EATING PLAN? For the DASH eating plan, you will follow these general guidelines:  Choose foods with a percent daily value for sodium of less than 5% (as listed on the food label).  Use salt-free seasonings or herbs instead of table salt or sea salt.  Check with your health care provider or pharmacist before using salt substitutes.  Eat lower-sodium products, often labeled as "lower sodium" or "no salt added."  Eat fresh foods.  Eat more vegetables, fruits, and low-fat dairy products.  Choose whole grains. Look for the word "whole" as the first word in the ingredient list.  Choose fish and skinless chicken or [place] more often than red meat. Limit fish, poultry, and meat to 6 oz (170 g) each day.  Limit sweets, desserts, sugars, and sugary drinks.  Choose heart-healthy fats.  Limit cheese to 1 oz (28 g) per day.  Eat more home-cooked food and less restaurant, buffet, and fast food.  Limit fried foods.  Cook foods using methods other than frying.  Limit canned vegetables. If you do use them, rinse them well to decrease the sodium.  When eating at a restaurant, ask that your food be prepared with less salt, or no salt if possible. WHAT FOODS CAN I EAT? Seek  help from a dietitian for individual calorie needs. Grains Whole grain or whole wheat bread. Brown rice. Whole grain or whole wheat pasta. Quinoa, bulgur, and whole grain cereals. Low-sodium cereals. Corn or whole wheat flour tortillas. Whole grain cornbread. Whole grain crackers. Low-sodium crackers. Vegetables Fresh or frozen vegetables (raw, steamed, roasted, or grilled). Low-sodium or reduced-sodium tomato and vegetable juices. Low-sodium or reduced-sodium tomato sauce and paste. Low-sodium or reduced-sodium canned vegetables.  Fruits All fresh, canned (in natural juice),  or frozen fruits. Meat and Other Protein Products Ground beef (85% or leaner), grass-fed beef, or beef trimmed of fat. Skinless chicken or [place]. Ground chicken or [place]. Pork trimmed of fat. All fish and seafood. Eggs. Dried beans, peas, or lentils. Unsalted nuts and seeds. Unsalted canned beans. Dairy Low-fat dairy products, such as skim or 1% milk, 2% or reduced-fat cheeses, low-fat ricotta or cottage cheese, or plain low-fat yogurt. Low-sodium or reduced-sodium cheeses. Fats and Oils Tub margarines without trans fats. Light or reduced-fat mayonnaise and salad dressings (reduced sodium). Avocado. Safflower, olive, or canola oils. Natural peanut or almond butter. Other Unsalted popcorn and pretzels. The items listed above may not be a complete list of recommended foods or beverages. Contact your dietitian for more options. WHAT FOODS ARE NOT RECOMMENDED? Grains White bread. White pasta. White rice. Refined cornbread. Bagels and croissants. Crackers that contain trans fat. Vegetables Creamed or fried vegetables. Vegetables in a cheese sauce. Regular canned vegetables. Regular canned tomato sauce and paste. Regular tomato and vegetable juices. Fruits Dried fruits. Canned fruit in light or heavy syrup. Fruit juice. Meat and Other Protein Products Fatty cuts of meat. Ribs, chicken wings, bacon, sausage, bologna, salami, chitterlings, fatback, hot dogs, bratwurst, and packaged luncheon meats. Salted nuts and seeds. Canned beans with salt. Dairy Whole or 2% milk, cream, half-and-half, and cream cheese. Whole-fat or sweetened yogurt. Full-fat cheeses or blue cheese. Nondairy creamers and whipped toppings. Processed cheese, cheese spreads, or cheese curds. Condiments Onion and garlic salt, seasoned salt, table salt, and sea salt. Canned and packaged gravies. Worcestershire sauce. Tartar sauce. Barbecue sauce. Teriyaki sauce. Soy sauce, including reduced sodium. Steak sauce. Fish sauce. Oyster  sauce. Cocktail sauce. Horseradish. Ketchup and mustard. Meat flavorings and tenderizers. Bouillon cubes. Hot sauce. Tabasco sauce. Marinades. Taco seasonings. Relishes. Fats and Oils Butter, stick margarine, lard, shortening, ghee, and bacon fat. Coconut, palm kernel, or palm oils. Regular salad dressings. Other Pickles and olives. Salted popcorn and pretzels. The items listed above may not be a complete list of foods and beverages to avoid. Contact your dietitian for more information. WHERE CAN I FIND MORE INFORMATION? National Heart, Lung, and Blood Institute: [employer] Document Released: 03/02/2011 Document Revised: 07/28/2013 Document Reviewed: 01/15/2013 [facility] Patient Information 2015 [place], [place]. This information is not intended to replace advice given to you by your health care provider. Make sure you discuss any questions you have with your health care provider.   Vitamin D goal is between 60-80  Please make sure that you are taking your Vitamin D as directed.   It is very important as a natural anti-inflammatory   helping hair, skin, and nails, as well as reducing stroke and heart attack risk.   It helps your bones and helps with mood.  We want you on at least 5000 IU daily  It also decreases numerous cancer risks so please take it as directed.   Low Vit D is associated with a 200-300% higher risk for CANCER   and 200-300% higher risk for  HEART   ATTACK  &  STROKE.    .....................................Marland Kitchen.  It is also associated with higher death rate at younger ages,   autoimmune diseases like Rheumatoid arthritis, Lupus, Multiple Sclerosis.     Also many other serious conditions, like depression, Alzheimer's  Dementia, infertility, muscle aches, fatigue, fibromyalgia - just to name a few.  +++++++++++++++++++  Can get liquid vitamin D from [employer] [facility]  OR [place] in [place] at  [facility] [address],  [place], [place] [id number] Or you can try earth fare

## 2016-10-05 LAB — URINALYSIS, ROUTINE W REFLEX MICROSCOPIC
Bilirubin Urine: NEGATIVE
Hgb urine dipstick: NEGATIVE
Ketones, ur: NEGATIVE
LEUKOCYTES UA: NEGATIVE
NITRITE: NEGATIVE
PH: 6 (ref 5.0–8.0)
Protein, ur: NEGATIVE
SPECIFIC GRAVITY, URINE: 1.033 (ref 1.001–1.035)

## 2016-10-05 LAB — URINALYSIS, MICROSCOPIC ONLY
Bacteria, UA: NONE SEEN [HPF]
Casts: NONE SEEN [LPF]
Crystals: NONE SEEN [HPF]
RBC / HPF: NONE SEEN RBC/HPF (ref <=2)
SQUAMOUS EPITHELIAL / LPF: NONE SEEN [HPF] (ref <=5)
WBC UA: NONE SEEN WBC/HPF (ref <=5)
YEAST: NONE SEEN [HPF]

## 2016-10-05 LAB — URIC ACID: URIC ACID, SERUM: 4.6 mg/dL (ref 4.0–8.0)

## 2016-10-05 LAB — MICROALBUMIN / CREATININE URINE RATIO
Creatinine, Urine: 133 mg/dL (ref 20–370)
MICROALB UR: 0.8 mg/dL
MICROALB/CREAT RATIO: 6 ug/mg{creat} (ref <30)

## 2016-10-05 LAB — MAGNESIUM: Magnesium: 2.1 mg/dL (ref 1.5–2.5)

## 2016-10-05 LAB — TESTOSTERONE: TESTOSTERONE: 163 ng/dL — AB (ref 250–827)

## 2016-10-05 LAB — HEMOGLOBIN A1C
Hgb A1c MFr Bld: 7.7 % — ABNORMAL HIGH (ref <5.7)
MEAN PLASMA GLUCOSE: 174 mg/dL

## 2016-10-05 LAB — VITAMIN B12: Vitamin B-12: 244 pg/mL (ref 200–1100)

## 2016-10-05 LAB — PSA: PSA: 1.4 ng/mL (ref <=4.0)

## 2016-10-05 NOTE — Progress Notes (Signed)
LVM for pt to return office call for LAB results.

## 2016-10-06 ENCOUNTER — Ambulatory Visit (INDEPENDENT_AMBULATORY_CARE_PROVIDER_SITE_OTHER): Payer: BC Managed Care – PPO | Admitting: *Deleted

## 2016-10-06 DIAGNOSIS — E291 Testicular hypofunction: Secondary | ICD-10-CM

## 2016-10-06 MED ORDER — TESTOSTERONE CYPIONATE 200 MG/ML IM SOLN
400.0000 mg | Freq: Once | INTRAMUSCULAR | Status: AC
  Administered 2016-10-06: 400 mg via INTRAMUSCULAR

## 2016-10-06 NOTE — Progress Notes (Signed)
Patient here for a NV for his Testosterone Cypionate injection 200 mg/ml 2 ml IM in left upper outer quadrant.  Patient tolerated well and returns in 2 weeks for next injection.

## 2016-10-23 ENCOUNTER — Ambulatory Visit (INDEPENDENT_AMBULATORY_CARE_PROVIDER_SITE_OTHER): Payer: BC Managed Care – PPO

## 2016-10-23 VITALS — Ht 72.5 in | Wt 232.2 lb

## 2016-10-23 DIAGNOSIS — E349 Endocrine disorder, unspecified: Secondary | ICD-10-CM | POA: Diagnosis not present

## 2016-10-23 MED ORDER — TESTOSTERONE CYPIONATE 200 MG/ML IM SOLN
200.0000 mg | INTRAMUSCULAR | Status: AC
  Administered 2016-10-23 – 2017-05-23 (×2): 200 mg via INTRAMUSCULAR

## 2016-10-23 NOTE — Progress Notes (Signed)
(  Computer kicked me out) Patient here for a NV for his Testosterone Cypionate injection 200 mg/ml 2 ml IM in left upper outer quadrant.  Patient tolerated well and returns in 2 weeks for next injection.

## 2016-10-28 ENCOUNTER — Other Ambulatory Visit: Payer: Self-pay | Admitting: Physician Assistant

## 2016-10-28 ENCOUNTER — Other Ambulatory Visit: Payer: Self-pay | Admitting: Internal Medicine

## 2016-11-14 ENCOUNTER — Ambulatory Visit (INDEPENDENT_AMBULATORY_CARE_PROVIDER_SITE_OTHER): Payer: BC Managed Care – PPO | Admitting: *Deleted

## 2016-11-14 DIAGNOSIS — E291 Testicular hypofunction: Secondary | ICD-10-CM

## 2016-11-14 MED ORDER — TESTOSTERONE CYPIONATE 200 MG/ML IM SOLN
400.0000 mg | Freq: Once | INTRAMUSCULAR | Status: AC
  Administered 2017-11-30: 400 mg via INTRAMUSCULAR

## 2016-11-14 NOTE — Progress Notes (Signed)
Patient here for a NV for Testosterone Cypionate 200 mg/ml 2 ml IM injection in right upper outer quadrant. Patient tolerated well. 

## 2016-12-08 ENCOUNTER — Ambulatory Visit (INDEPENDENT_AMBULATORY_CARE_PROVIDER_SITE_OTHER): Payer: BC Managed Care – PPO | Admitting: *Deleted

## 2016-12-08 DIAGNOSIS — E291 Testicular hypofunction: Secondary | ICD-10-CM

## 2016-12-08 MED ORDER — TESTOSTERONE CYPIONATE 200 MG/ML IM SOLN
400.0000 mg | Freq: Once | INTRAMUSCULAR | Status: AC
  Administered 2016-12-08: 400 mg via INTRAMUSCULAR

## 2016-12-08 NOTE — Progress Notes (Signed)
Patient for his Testosterone Cypionate 200 mg/ml 2 ml IM in left upper outer quadrant. Patient tolerated well.

## 2016-12-25 ENCOUNTER — Ambulatory Visit (INDEPENDENT_AMBULATORY_CARE_PROVIDER_SITE_OTHER): Payer: BC Managed Care – PPO | Admitting: *Deleted

## 2016-12-25 DIAGNOSIS — E291 Testicular hypofunction: Secondary | ICD-10-CM

## 2016-12-25 MED ORDER — TESTOSTERONE CYPIONATE 200 MG/ML IM SOLN
400.0000 mg | Freq: Once | INTRAMUSCULAR | Status: AC
  Administered 2016-12-25: 400 mg via INTRAMUSCULAR

## 2016-12-25 NOTE — Progress Notes (Signed)
patient here for a NV for testosterone Cypionate 200 mg/ml IM injection in right upper outer quadrant.  Patient tolerated well.

## 2017-01-04 ENCOUNTER — Encounter: Payer: Self-pay | Admitting: Internal Medicine

## 2017-01-22 ENCOUNTER — Ambulatory Visit: Payer: Self-pay | Admitting: Physician Assistant

## 2017-01-24 ENCOUNTER — Ambulatory Visit (INDEPENDENT_AMBULATORY_CARE_PROVIDER_SITE_OTHER): Payer: BC Managed Care – PPO

## 2017-01-24 DIAGNOSIS — E349 Endocrine disorder, unspecified: Secondary | ICD-10-CM

## 2017-01-24 MED ORDER — TESTOSTERONE CYPIONATE 200 MG/ML IM SOLN
200.0000 mg | Freq: Once | INTRAMUSCULAR | Status: AC
  Administered 2017-01-24: 200 mg via INTRAMUSCULAR

## 2017-01-24 NOTE — Progress Notes (Signed)
Patient here for a NV for his Testosterone Cypionate injection 200 mg/ml 2 ml IM in right ventrogluteal. Patient tolerated well and returns in 2 weeks for next injection.

## 2017-03-01 ENCOUNTER — Ambulatory Visit: Payer: BC Managed Care – PPO

## 2017-03-01 DIAGNOSIS — E349 Endocrine disorder, unspecified: Secondary | ICD-10-CM

## 2017-03-01 MED ORDER — TESTOSTERONE CYPIONATE 200 MG/ML IM SOLN
200.0000 mg | Freq: Once | INTRAMUSCULAR | Status: AC
  Administered 2017-03-01: 200 mg via INTRAMUSCULAR

## 2017-03-01 NOTE — Progress Notes (Signed)
Patient here for a NV for his Testosterone Cypionate injection 200 mg/ml 2 ml IM in left ventrogluteal. Patient tolerated well and returns in 2 weeks for next injection.

## 2017-03-29 ENCOUNTER — Other Ambulatory Visit: Payer: Self-pay

## 2017-03-29 ENCOUNTER — Ambulatory Visit (INDEPENDENT_AMBULATORY_CARE_PROVIDER_SITE_OTHER): Payer: BC Managed Care – PPO

## 2017-03-29 DIAGNOSIS — E349 Endocrine disorder, unspecified: Secondary | ICD-10-CM

## 2017-03-29 MED ORDER — TESTOSTERONE CYPIONATE 200 MG/ML IM SOLN: INTRAMUSCULAR | 0 refills | Status: DC

## 2017-03-29 MED ORDER — TESTOSTERONE CYPIONATE 200 MG/ML IM SOLN
200.0000 mg | Freq: Once | INTRAMUSCULAR | Status: AC
  Administered 2017-03-29: 200 mg via INTRAMUSCULAR

## 2017-03-29 NOTE — Progress Notes (Signed)
Patient here for a NV for his Testosterone Cypionate injection 200 mg/ml 2 ml IM in RIGHT ventrogluteal. Patient tolerated well and returns in 2 weeks for next injection.

## 2017-04-19 ENCOUNTER — Other Ambulatory Visit: Payer: Self-pay | Admitting: *Deleted

## 2017-04-19 DIAGNOSIS — E349 Endocrine disorder, unspecified: Secondary | ICD-10-CM

## 2017-04-19 MED ORDER — TESTOSTERONE CYPIONATE 200 MG/ML IM SOLN: INTRAMUSCULAR | 1 refills | Status: DC

## 2017-04-23 ENCOUNTER — Ambulatory Visit: Payer: Self-pay | Admitting: Physician Assistant

## 2017-04-30 DIAGNOSIS — N182 Chronic kidney disease, stage 2 (mild): Secondary | ICD-10-CM

## 2017-04-30 DIAGNOSIS — E1122 Type 2 diabetes mellitus with diabetic chronic kidney disease: Secondary | ICD-10-CM | POA: Insufficient documentation

## 2017-04-30 NOTE — Progress Notes (Signed)
FOLLOW UP  Assessment and Plan:   Hypertension Above goal in office today; instructed to double ziac 5-6.25 until he runs out, refill will be at increased dose of ziac 10-6.25  Monitor blood pressure at home; patient to call if consistently greater than 130/80 Continue DASH diet.   Reminder to go to the ER if any CP, SOB, nausea, dizziness, severe HA, changes vision/speech, left arm numbness and tingling and jaw pain.  Cholesterol Currently above goal; he has been out of cholesterol medication; will refill and restart - reeval in 3 months Continue low cholesterol diet and exercise.  Check lipid panel.   Diabetes with diabetic chronic kidney disease Continue medication: metformin 500 mg 2 tabs BID Continue diet and exercise.  Reminded he needs annual eye exam- he agrees to schedule this Perform daily foot/skin check, notify office of any concerning changes.  Check A1C  Obesity with co morbidities Long discussion about weight loss, diet, and exercise Recommended diet heavy in fruits and veggies and low in animal meats, cheeses, and dairy products, appropriate calorie intake Discussed ideal weight for height and initial weight goal (215 lb) Will follow up in 3 months  Hypogonadism - continue replacement therapy, check testosterone levels as needed.   Vitamin D Def Continue supplementation for goal of 70-100 Check vitamin D level  Stage 2 CKD Increase fluids, avoid NSAIDS, monitor sugars, will monitor BMP/GFR  Fatigue AM fatigue despite 8+ hours of sleep; does admittedly snore-  Discussed possible benefit from sleep study, not agreeable to arranging for this at this time. Will review labs from today including TSH for abnormalities Low B12 on labs from 10/04/2016 - recommended start b complex vitamin  Cerumen impaction Fully disimpacted by loop without difficulty; no acute external otitis/pain/decreased hearing - stop using Qtips, use OTC drops/oil, if not better come to the  office for irrigation  Continue diet and meds as discussed. Further disposition pending results of labs. Discussed med's effects and SE's.   Over 30 minutes of exam, counseling, chart review, and critical decision making was performed.   Future Appointments  Date Time Provider Department Center  10/04/2017  9:00 AM Quentin Mulling, PA-C [id number] None    ----------------------------------------------------------------------------------------------------------------------  HPI 62 y.o. male  presents for 6 month follow up on hypertension, cholesterol, diabetes, obesity, hypogonadism on testosterone supplementation and vitamin D deficiency. He is concerned about ongoing fatigue; he reports he will "wake up dog tired" despite sleeping 10 hours - he is initially concerned this may be related to testosterone dose being too low. He does endorse that he snores at night; denies difficulty falling asleep or staying asleep. Denies frequent night time awakenings to urinate. Denies HA, vision changes, sore throat, dizziness. Discussed possible benefit of sleep study to r/o sleep apnea; not agreeable to ordering this today. Will review labs obtained today; recommended he start on a b-complex vitamin after review of low vitamin B12 level on recent check (244 on 10/04/2016).  BMI is Body mass index is 30.5 kg/m., he has been working on diet and exercise. Has cut out sodas completely.  Wt Readings from Last 3 Encounters:  05/01/17 228 lb (103.4 kg)  10/23/16 232 lb 3.2 oz (105.3 kg)  10/04/16 234 lb 3.2 oz (106.2 kg)   He does not check BP at home, today their BP is BP: (!) 150/90 Similar on manual recheck by provider.   He does workout; he is going to the gym daily.  He denies chest pain, shortness of breath, dizziness.  He is on cholesterol medication (pravachol 20 mg daily - but has been out for several months now)  and denies myalgias. His cholesterol is not at goal. The cholesterol last visit was:    Lab Results  Component Value Date   CHOL 222 (H) 10/04/2016   HDL 36 (L) 10/04/2016   LDLCALC 118 (H) 10/04/2016   TRIG 339 (H) 10/04/2016   CHOLHDL 6.2 (H) 10/04/2016    He has been working on diet and exercise for T2 diabetes, and denies increased appetite, nausea, paresthesia of the feet, polydipsia, polyuria, visual disturbances and vomiting. Last A1C in the office was:  Lab Results  Component Value Date   HGBA1C 7.7 (H) 10/04/2016   Patient is not on Vitamin D supplement and quite low at last check:    Lab Results  Component Value Date   VD25OH 23 (L) 03/08/2016     He has a history of testosterone deficiency and is on testosterone replacement (due for injection today).  He states that the testosterone helps with his energy, libido, muscle mass. Lab Results  Component Value Date   TESTOSTERONE 163 (L) 10/04/2016      Current Medications:  Current Outpatient Medications on File Prior to Visit  Medication Sig  . aspirin 81 MG chewable tablet Chew by mouth daily.  . bisoprolol-hydrochlorothiazide (ZIAC) 5-6.25 MG tablet TAKE ONE TABLET BY MOUTH ONCE DAILY FOR BLOOD PRESSURE  . metFORMIN (GLUCOPHAGE-XR) 500 MG 24 hr tablet TAKE TWO TABLETS BY MOUTH TWICE DAILY  . ranitidine (ZANTAC) 300 MG tablet TAKE ONE TABLET BY MOUTH ONCE DAILY FOR  ACID  REFLUX  . tamsulosin (FLOMAX) 0.4 MG CAPS capsule Take 1 capsule (0.4 mg total) by mouth at bedtime. (Patient taking differently: Take 0.4 mg by mouth as needed. )  . testosterone cypionate (DEPOTESTOSTERONE CYPIONATE) 200 MG/ML injection INJECT TWO ML (CC) INTRAMUSCULARLY ONCE EVERY 2 WEEKS   Current Facility-Administered Medications on File Prior to Visit  Medication  . testosterone cypionate (DEPOTESTOSTERONE CYPIONATE) injection 200 mg  . testosterone cypionate (DEPOTESTOSTERONE CYPIONATE) injection 400 mg     Allergies:  Allergies  Allergen Reactions  . Codeine     REACTION: Anxiety     Medical History:  Past Medical  History:  Diagnosis Date  . GERD (gastroesophageal reflux disease)   . Hyperlipidemia   . Hypertension   . Other testicular hypofunction   . Prediabetes   . Vitamin D deficiency    Family history- Reviewed and unchanged Social history- Reviewed and unchanged   Review of Systems:  Review of Systems  Constitutional: Positive for malaise/fatigue. Negative for weight loss.  HENT: Negative for congestion, hearing loss, sinus pain, sore throat and tinnitus.   Eyes: Negative for blurred vision and double vision.  Respiratory: Negative for cough, shortness of breath and wheezing.   Cardiovascular: Negative for chest pain, palpitations, orthopnea, claudication and leg swelling.  Gastrointestinal: Negative for abdominal pain, blood in stool, constipation, diarrhea, heartburn, melena, nausea and vomiting.  Genitourinary: Negative.   Musculoskeletal: Negative for joint pain and myalgias.  Skin: Negative for rash.  Neurological: Negative for dizziness, tingling, sensory change, weakness and headaches.  Endo/Heme/Allergies: Negative for polydipsia.  Psychiatric/Behavioral: Negative.  Negative for depression, memory loss and substance abuse. The patient is not nervous/anxious and does not have insomnia.   All other systems reviewed and are negative.   Physical Exam: BP (!) 150/90   Pulse 75   Temp 97.9 F (36.6 C)   Ht 6' 0.5" (1.842 m)  Wt 228 lb (103.4 kg)   SpO2 97%   BMI 30.50 kg/m  Wt Readings from Last 3 Encounters:  05/01/17 228 lb (103.4 kg)  10/23/16 232 lb 3.2 oz (105.3 kg)  10/04/16 234 lb 3.2 oz (106.2 kg)   General Appearance: Well nourished, in no apparent distress. Eyes: PERRLA, EOMs, conjunctiva no swelling or erythema Sinuses: No Frontal/maxillary tenderness ENT/Mouth: Ext aud canals filled with wax - R fully impacted, L partially obstructed - fully disimpacted by loop, TMs without erythema, bulging. No erythema, swelling, or exudate on post pharynx.  Tonsils not  swollen or erythematous. Hearing normal.  Neck: Supple, thyroid normal.  Respiratory: Respiratory effort normal, BS equal bilaterally without rales, rhonchi, wheezing or stridor.  Cardio: RRR with no MRGs. Brisk peripheral pulses without edema.  Abdomen: Soft, + BS.  Non tender, no guarding, rebound, hernias, masses. Lymphatics: Non tender without lymphadenopathy.  Musculoskeletal: Full ROM, 5/5 strength, Normal gait Skin: Warm, dry without rashes, lesions, ecchymosis.  Neuro: Cranial nerves intact. No cerebellar symptoms.  Psych: Awake and oriented X 3, normal affect, Insight and Judgment appropriate.    Dan Maker, NP 4:34 PM [facility]

## 2017-05-01 ENCOUNTER — Encounter: Payer: Self-pay | Admitting: Adult Health

## 2017-05-01 ENCOUNTER — Ambulatory Visit: Payer: BC Managed Care – PPO | Admitting: Adult Health

## 2017-05-01 VITALS — BP 150/90 | HR 75 | Temp 97.9°F | Ht 72.5 in | Wt 228.0 lb

## 2017-05-01 DIAGNOSIS — I10 Essential (primary) hypertension: Secondary | ICD-10-CM

## 2017-05-01 DIAGNOSIS — E669 Obesity, unspecified: Secondary | ICD-10-CM

## 2017-05-01 DIAGNOSIS — N182 Chronic kidney disease, stage 2 (mild): Secondary | ICD-10-CM | POA: Diagnosis not present

## 2017-05-01 DIAGNOSIS — Z79899 Other long term (current) drug therapy: Secondary | ICD-10-CM

## 2017-05-01 DIAGNOSIS — E559 Vitamin D deficiency, unspecified: Secondary | ICD-10-CM

## 2017-05-01 DIAGNOSIS — E349 Endocrine disorder, unspecified: Secondary | ICD-10-CM

## 2017-05-01 DIAGNOSIS — H6121 Impacted cerumen, right ear: Secondary | ICD-10-CM

## 2017-05-01 DIAGNOSIS — E1122 Type 2 diabetes mellitus with diabetic chronic kidney disease: Secondary | ICD-10-CM

## 2017-05-01 DIAGNOSIS — R5383 Other fatigue: Secondary | ICD-10-CM

## 2017-05-01 DIAGNOSIS — R0683 Snoring: Secondary | ICD-10-CM | POA: Insufficient documentation

## 2017-05-01 DIAGNOSIS — E785 Hyperlipidemia, unspecified: Secondary | ICD-10-CM | POA: Diagnosis not present

## 2017-05-01 MED ORDER — PRAVASTATIN SODIUM 20 MG PO TABS: 20.0000 mg | ORAL_TABLET | Freq: Every evening | ORAL | 11 refills | Status: DC

## 2017-05-01 MED ORDER — TESTOSTERONE CYPIONATE 200 MG/ML IM SOLN
200.0000 mg | Freq: Once | INTRAMUSCULAR | Status: AC
  Administered 2017-05-01: 200 mg via INTRAMUSCULAR

## 2017-05-01 MED ORDER — BISOPROLOL-HYDROCHLOROTHIAZIDE 10-6.25 MG PO TABS: 1.0000 | ORAL_TABLET | Freq: Every day | ORAL | 1 refills | Status: DC

## 2017-05-01 NOTE — Patient Instructions (Signed)
Try starting on a B-complex vitamin (which contains multiple types of B vitamins) - if you are low in this vitamin, it can lead to fatigue and sluggishness  Double up on your ziac 5-6.25 medication until you run out; when you refill, it will be at a higher dose and can resume 1 tab daily.   Monitor your blood pressure at home. Go to the ER if any CP, SOB, nausea, dizziness, severe HA, changes vision/speech   Goal BP minimum < 140/90, ideally <130/80  Your most recent BP: BP: (!) 150/90   Take your medications faithfully as instructed. Maintain a healthy weight. Get at least 150 minutes of aerobic exercise per week. Minimize salt intake. Minimize alcohol intake     I think it is possible that you have sleep apnea. It can cause interrupted sleep, headaches, frequent awakenings, fatigue, dry mouth, fast/slow heart beats, memory issues, anxiety/depression, swelling, numbness tingling hands/feet, weight gain, shortness of breath, and the list goes on. Sleep apnea needs to be ruled out because if it is left untreated it does eventually lead to abnormal heart beats, lung failure or heart failure as well as increasing the risk of heart attack and stroke. There are masks you can wear OR a mouth piece that I can give you information about. Often times though people feel MUCH better after getting treatment.   Sleep Apnea  Sleep apnea is a sleep disorder characterized by abnormal pauses in breathing while you sleep. When your breathing pauses, the level of oxygen in your blood decreases. This causes you to move out of deep sleep and into light sleep. As a result, your quality of sleep is poor, and the system that carries your blood throughout your body (cardiovascular system) experiences stress. If sleep apnea remains untreated, the following conditions can develop:  High blood pressure (hypertension).  Coronary artery disease.  Inability to achieve or maintain an erection (impotence).  Impairment  of your thought process (cognitive dysfunction). There are three types of sleep apnea: 1. Obstructive sleep apnea--Pauses in breathing during sleep because of a blocked airway. 2. Central sleep apnea--Pauses in breathing during sleep because the area of the brain that controls your breathing does not send the correct signals to the muscles that control breathing. 3. Mixed sleep apnea--A combination of both obstructive and central sleep apnea.  RISK FACTORS The following risk factors can increase your risk of developing sleep apnea:  Being overweight.  Smoking.  Having narrow passages in your nose and throat.  Being of older age.  Being male.  Alcohol use.  Sedative and tranquilizer use.  Ethnicity. Among individuals younger than 35 years, African Americans are at increased risk of sleep apnea. SYMPTOMS   Difficulty staying asleep.  Daytime sleepiness and fatigue.  Loss of energy.  Irritability.  Loud, heavy snoring.  Morning headaches.  Trouble concentrating.  Forgetfulness.  Decreased interest in sex. DIAGNOSIS  In order to diagnose sleep apnea, your caregiver will perform a physical examination. Your caregiver may suggest that you take a home sleep test. Your caregiver may also recommend that you spend the night in a sleep lab. In the sleep lab, several monitors record information about your heart, lungs, and brain while you sleep. Your leg and arm movements and blood oxygen level are also recorded. TREATMENT The following actions may help to resolve mild sleep apnea:  Sleeping on your side.   Using a decongestant if you have nasal congestion.   Avoiding the use of depressants, including alcohol, sedatives,  and narcotics.   Losing weight and modifying your diet if you are overweight. There also are devices and treatments to help open your airway:  Oral appliances. These are custom-made mouthpieces that shift your lower jaw forward and slightly open your  bite. This opens your airway.  Devices that create positive airway pressure. This positive pressure "splints" your airway open to help you breathe better during sleep. The following devices create positive airway pressure:  Continuous positive airway pressure (CPAP) device. The CPAP device creates a continuous level of air pressure with an air pump. The air is delivered to your airway through a mask while you sleep. This continuous pressure keeps your airway open.  Nasal expiratory positive airway pressure (EPAP) device. The EPAP device creates positive air pressure as you exhale. The device consists of single-use valves, which are inserted into each nostril and held in place by adhesive. The valves create very little resistance when you inhale but create much more resistance when you exhale. That increased resistance creates the positive airway pressure. This positive pressure while you exhale keeps your airway open, making it easier to breath when you inhale again.  Bilevel positive airway pressure (BPAP) device. The BPAP device is used mainly in patients with central sleep apnea. This device is similar to the CPAP device because it also uses an air pump to deliver continuous air pressure through a mask. However, with the BPAP machine, the pressure is set at two different levels. The pressure when you exhale is lower than the pressure when you inhale.  Surgery. Typically, surgery is only done if you cannot comply with less invasive treatments or if the less invasive treatments do not improve your condition. Surgery involves removing excess tissue in your airway to create a wider passage way. Document Released: 03/03/2002 Document Revised: 07/08/2012 Document Reviewed: 07/20/2011 [facility] Patient Information 2015 [place], [place]. This information is not intended to replace advice given to you by your health care provider. Make sure you discuss any questions you have with your health care  provider.

## 2017-05-01 NOTE — Progress Notes (Signed)
Patient presents to the office for a Testosterone Cypionate injection 200 mg/ml 2 ml IM in LEFT ventrogluteal. Patient tolerated well and returns in 2 weeks for next injection.

## 2017-05-02 ENCOUNTER — Other Ambulatory Visit: Payer: Self-pay | Admitting: Adult Health

## 2017-05-02 DIAGNOSIS — E782 Mixed hyperlipidemia: Secondary | ICD-10-CM

## 2017-05-02 LAB — BASIC METABOLIC PANEL WITH GFR
BUN: 23 mg/dL (ref 7–25)
CO2: 32 mmol/L (ref 20–32)
CREATININE: 1.06 mg/dL (ref 0.70–1.25)
Calcium: 9.5 mg/dL (ref 8.6–10.3)
Chloride: 105 mmol/L (ref 98–110)
GFR, EST AFRICAN AMERICAN: 87 mL/min/{1.73_m2} (ref >=60)
GFR, EST NON AFRICAN AMERICAN: 75 mL/min/{1.73_m2} (ref >=60)
Glucose, Bld: 190 mg/dL — ABNORMAL HIGH (ref 65–99)
Potassium: 4.7 mmol/L (ref 3.5–5.3)
Sodium: 140 mmol/L (ref 135–146)

## 2017-05-02 LAB — HEPATIC FUNCTION PANEL
AG RATIO: 1.6 (calc) (ref 1.0–2.5)
ALKALINE PHOSPHATASE (APISO): 100 U/L (ref 40–115)
ALT: 21 U/L (ref 9–46)
AST: 16 U/L (ref 10–35)
Albumin: 4.5 g/dL (ref 3.6–5.1)
BILIRUBIN DIRECT: 0.1 mg/dL (ref 0.0–0.2)
BILIRUBIN TOTAL: 0.4 mg/dL (ref 0.2–1.2)
Globulin: 2.8 g/dL (calc) (ref 1.9–3.7)
Indirect Bilirubin: 0.3 mg/dL (calc) (ref 0.2–1.2)
Total Protein: 7.3 g/dL (ref 6.1–8.1)

## 2017-05-02 LAB — LIPID PANEL
Cholesterol: 224 mg/dL — ABNORMAL HIGH (ref <200)
HDL: 32 mg/dL — ABNORMAL LOW (ref >40)
NON-HDL CHOLESTEROL (CALC): 192 mg/dL — AB (ref <130)
TRIGLYCERIDES: 401 mg/dL — AB (ref <150)
Total CHOL/HDL Ratio: 7 (calc) — ABNORMAL HIGH (ref <5.0)

## 2017-05-02 LAB — CBC WITH DIFFERENTIAL/PLATELET
BASOS ABS: 53 {cells}/uL (ref 0–200)
Basophils Relative: 0.8 %
EOS ABS: 152 {cells}/uL (ref 15–500)
EOS PCT: 2.3 %
HEMATOCRIT: 44.6 % (ref 38.5–50.0)
HEMOGLOBIN: 15.2 g/dL (ref 13.2–17.1)
LYMPHS ABS: 2442 {cells}/uL (ref 850–3900)
MCH: 29.4 pg (ref 27.0–33.0)
MCHC: 34.1 g/dL (ref 32.0–36.0)
MCV: 86.3 fL (ref 80.0–100.0)
MPV: 11.1 fL (ref 7.5–12.5)
Monocytes Relative: 8.2 %
NEUTROS ABS: 3412 {cells}/uL (ref 1500–7800)
Neutrophils Relative %: 51.7 %
Platelets: 225 10*3/uL (ref 140–400)
RBC: 5.17 10*6/uL (ref 4.20–5.80)
RDW: 12.2 % (ref 11.0–15.0)
Total Lymphocyte: 37 %
WBC: 6.6 10*3/uL (ref 3.8–10.8)
WBCMIX: 541 {cells}/uL (ref 200–950)

## 2017-05-02 LAB — HEMOGLOBIN A1C
EAG (MMOL/L): 9.8 (calc)
Hgb A1c MFr Bld: 7.8 % of total Hgb — ABNORMAL HIGH (ref <5.7)
Mean Plasma Glucose: 177 (calc)

## 2017-05-02 LAB — VITAMIN D 25 HYDROXY (VIT D DEFICIENCY, FRACTURES): Vit D, 25-Hydroxy: 20 ng/mL — ABNORMAL LOW (ref 30–100)

## 2017-05-02 LAB — TSH: TSH: 2.85 m[IU]/L (ref 0.40–4.50)

## 2017-05-02 MED ORDER — FENOFIBRATE 145 MG PO TABS: 145.0000 mg | ORAL_TABLET | Freq: Every day | ORAL | 1 refills | Status: DC

## 2017-05-07 ENCOUNTER — Telehealth: Payer: Self-pay

## 2017-05-07 NOTE — Telephone Encounter (Signed)
Patient requesting a call back. Has questions about his labs. I spoke with him and he wants to talk directly to NealAshley.

## 2017-05-07 NOTE — Telephone Encounter (Signed)
Called patient back on mobil and home # without response. Left message for him to call back tomorrow at his convenience.

## 2017-05-08 ENCOUNTER — Telehealth: Payer: Self-pay | Admitting: Adult Health

## 2017-05-08 ENCOUNTER — Other Ambulatory Visit: Payer: Self-pay | Admitting: Adult Health

## 2017-05-08 DIAGNOSIS — I10 Essential (primary) hypertension: Secondary | ICD-10-CM

## 2017-05-08 MED ORDER — OLMESARTAN MEDOXOMIL 40 MG PO TABS: ORAL_TABLET | ORAL | 2 refills | Status: DC

## 2017-05-08 NOTE — Progress Notes (Signed)
Received patient phone call and answered questions regarding lab results. Patient requesting change in BP medications due to ? Perceived drowsiness with current agent. Discussed appropriate taper off of agent and will start olmesartan 40 mg - take 1/2-1 tab daily to maintain BP under 130/80. Patient understards to start gradually and monitor for SE; goal is under 130/80. Patient to call if remains persistently elevated or with any new concerns.

## 2017-05-08 NOTE — Telephone Encounter (Signed)
Have made several attempts to call patient back regarding unspecified questions regarding lab results provided by nurse. Refuses to leave message with nurse to be communicated to provider. Will attempt to call back at a later time.

## 2017-05-09 ENCOUNTER — Telehealth: Payer: Self-pay

## 2017-05-09 NOTE — Telephone Encounter (Signed)
Patient states that the Blood Pressure medication has been recalled. Please advise

## 2017-05-10 ENCOUNTER — Other Ambulatory Visit: Payer: Self-pay

## 2017-05-10 DIAGNOSIS — I10 Essential (primary) hypertension: Secondary | ICD-10-CM

## 2017-05-10 MED ORDER — OLMESARTAN MEDOXOMIL 40 MG PO TABS: ORAL_TABLET | ORAL | 2 refills | Status: DC

## 2017-05-10 NOTE — Telephone Encounter (Signed)
[facility] on patient's pharmacy list has Olmesartan available. Will send in Rx there. Patient notified

## 2017-05-20 ENCOUNTER — Other Ambulatory Visit: Payer: Self-pay | Admitting: Internal Medicine

## 2017-05-20 DIAGNOSIS — K921 Melena: Secondary | ICD-10-CM

## 2017-05-23 ENCOUNTER — Ambulatory Visit: Payer: BC Managed Care – PPO | Admitting: *Deleted

## 2017-05-23 DIAGNOSIS — E349 Endocrine disorder, unspecified: Secondary | ICD-10-CM | POA: Diagnosis not present

## 2017-05-23 NOTE — Progress Notes (Signed)
Patient here for a nurse visit for Testosterone Cypionate 200 mg/ml 2 ml IM in the right upper outer quadrant. Patient will return in 2 weeks for his next injection.

## 2017-05-24 ENCOUNTER — Ambulatory Visit: Payer: BC Managed Care – PPO | Admitting: Gastroenterology

## 2017-05-24 ENCOUNTER — Encounter: Payer: Self-pay | Admitting: Gastroenterology

## 2017-05-24 VITALS — BP 128/80 | HR 74 | Ht 72.0 in | Wt 229.0 lb

## 2017-05-24 DIAGNOSIS — K648 Other hemorrhoids: Secondary | ICD-10-CM

## 2017-05-24 DIAGNOSIS — K921 Melena: Secondary | ICD-10-CM

## 2017-05-24 MED ORDER — NA SULFATE-K SULFATE-MG SULF 17.5-3.13-1.6 GM/177ML PO SOLN: 1.0000 | Freq: Once | ORAL | 0 refills | Status: AC

## 2017-05-24 NOTE — Patient Instructions (Signed)
You have been scheduled for a colonoscopy. Please follow written instructions given to you at your visit today.  Please pick up your prep supplies at the pharmacy within the next 1-3 days. If you use inhalers (even only as needed), please bring them with you on the day of your procedure. Your physician has requested that you go to www.[email or web address].com and enter the access code given to you at your visit today. This web site gives a general overview about your procedure. However, you should still follow specific instructions given to you by our office regarding your preparation for the procedure.  Thank you for choosing me and [facility].  Malcolm T. Stark, Jr., MD., FACG  

## 2017-05-24 NOTE — Progress Notes (Signed)
    History of Present Illness: This is a 62 year old male with recent hematochezia.  He has a history of internal hemorrhoids confirmed at anoscopy in October 2017.  He relates 1 day diarrhea about 1 month ago that was associated with small amounts of bright red blood per rectum.  The diarrhea has resolved bowel habits have been normal since then and he has not seen any recurrent bleeding.  Colonoscopy performed in August 2011 showed 2 small hyperplastic polyps and no other abnormalities  Current Medications, Allergies, Past Medical History, Past Surgical History, Family History and Social History [facility].  Physical Exam: General: Well developed, well nourished, no acute distress Head: Normocephalic and atraumatic Eyes:  sclerae anicteric, EOMI Ears: Normal auditory acuity Mouth: No deformity or lesions Lungs: Clear throughout to auscultation Heart: Regular rate and rhythm; no murmurs, rubs or bruits Abdomen: Soft, non tender and non distended. No masses, hepatosplenomegaly or hernias noted. Normal Bowel sounds Rectal: Deferred to colonoscopy Musculoskeletal: Symmetrical with no gross deformities  Pulses:  Normal pulses noted Extremities: No clubbing, cyanosis, edema or deformities noted Neurological: Alert oriented x 4, grossly nonfocal Psychological:  Alert and cooperative. Normal mood and affect  Assessment and Recommendations:  1. Hematochezia.  History of internal hemorrhoids, likely etiology.  Rule out colorectal neoplasms and other disorders.  Schedule colonoscopy. The risks (including bleeding, perforation, infection, missed lesions, medication reactions and possible hospitalization or surgery if complications occur), benefits, and alternatives to colonoscopy with possible biopsy and possible polypectomy were discussed with the patient and they consent to proceed.

## 2017-05-28 ENCOUNTER — Telehealth: Payer: Self-pay | Admitting: Gastroenterology

## 2017-05-28 NOTE — Telephone Encounter (Signed)
No charge this time. 

## 2017-05-28 NOTE — Telephone Encounter (Signed)
Patient states he has a head cold and is running a fever. Patient canceled procedure for tomorrow 3.5.19 and states he will call back to reschedule when feeling better.

## 2017-05-29 ENCOUNTER — Encounter: Payer: BC Managed Care – PPO | Admitting: Gastroenterology

## 2017-06-18 ENCOUNTER — Encounter: Payer: BC Managed Care – PPO | Admitting: Gastroenterology

## 2017-06-26 ENCOUNTER — Ambulatory Visit: Payer: Self-pay

## 2017-07-04 ENCOUNTER — Ambulatory Visit: Payer: BC Managed Care – PPO

## 2017-07-04 ENCOUNTER — Other Ambulatory Visit: Payer: Self-pay | Admitting: *Deleted

## 2017-07-04 DIAGNOSIS — E349 Endocrine disorder, unspecified: Secondary | ICD-10-CM | POA: Diagnosis not present

## 2017-07-04 MED ORDER — TESTOSTERONE CYPIONATE 200 MG/ML IM SOLN
200.0000 mg | Freq: Once | INTRAMUSCULAR | Status: AC
  Administered 2017-07-04: 200 mg via INTRAMUSCULAR

## 2017-07-04 MED ORDER — DOXAZOSIN MESYLATE 8 MG PO TABS: ORAL_TABLET | ORAL | 1 refills | Status: DC

## 2017-07-04 NOTE — Progress Notes (Signed)
Patient here for a nurse visit for Testosterone Cypionate 200 mg/ml 2 ml IM in the LEFT upper outer quadrant. Patient will return in 2 weeks for his next injection.

## 2017-07-11 ENCOUNTER — Other Ambulatory Visit: Payer: Self-pay | Admitting: *Deleted

## 2017-07-11 MED ORDER — DOXAZOSIN MESYLATE 1 MG PO TABS: 1.0000 mg | ORAL_TABLET | Freq: Every day | ORAL | 0 refills | Status: DC

## 2017-07-28 NOTE — Progress Notes (Signed)
Assessment and Plan:   Hypertension -Continue medication, monitor blood pressure at home. Continue DASH diet.  Reminder to go to the ER if any CP, SOB, nausea, dizziness, severe HA, changes vision/speech, left arm numbness and tingling and jaw pain.  Cholesterol -Continue diet and exercise. Check cholesterol.   Diabetes with diabetic chronic kidney disease -Continue diet and exercise. Check A1C  Vitamin D Def - check level and continue medications.   Morbid Obesity with co morbidities - long discussion about weight loss, diet, and exercise  Hypogonadism - continue to monitor, states medication is helping with symptoms of low T.   Continue diet and meds as discussed. Further disposition pending results of labs. Discussed med's effects and SE's.    Over 30 minutes of exam, counseling, chart review, and critical decision making was performed Future Appointments  Date Time Provider Department Center  07/30/2017  8:45 AM Quentin Mulling, PA-C [facility] None  12/10/2017 10:00 AM Quentin Mulling, PA-C [facility] None  '  HPI 62 y.o. male  presents for 3 month follow up on hypertension, cholesterol, diabetes and vitamin D deficiency.   His blood pressure has been controlled at home, he can tolerate 1 mg doxazosin that helps BP and BPH, today his BP is BP: 122/86.  He does not workout. He denies chest pain, shortness of breath, dizziness.  He is on cholesterol medication and denies myalgias. His cholesterol is not at goal,, he was switched to lipitor last visit. The cholesterol was:   Lab Results  Component Value Date   CHOL 224 (H) 05/01/2017   HDL 32 (L) 05/01/2017   LDLCALC  05/01/2017     Comment:     . LDL cholesterol not calculated. Triglyceride levels greater than 400 mg/dL invalidate calculated LDL results. . Reference range: <100 . Desirable range <100 mg/dL for primary prevention;   <70 mg/dL for patients with CHD or diabetic patients  with > or = 2 CHD risk  factors. Marland Kitchen LDL-C is now calculated using the Martin-Hopkins  calculation, which is a validated novel method providing  better accuracy than the Friedewald equation in the  estimation of LDL-C.  Horald Pollen et al. Lenox Ahr. 4098;119(14): 2061-2068  (http://education.[facility].com/faq/FAQ164)    TRIG 401 (H) 05/01/2017   CHOLHDL 7.0 (H) 05/01/2017    He has been working on diet and exercise for diabetes with diabetic chronic kidney disease, he is on bASA, he is on ACE/ARB, he is on metformin, and denies  paresthesia of the feet, polydipsia, polyuria and visual disturbances. Last A1C was:  Lab Results  Component Value Date   HGBA1C 7.8 (H) 05/01/2017  Last GFR:   Lab Results  Component Value Date   GFRNONAA 75 05/01/2017    Patient is on Vitamin D supplement, but has not been on lately.  Lab Results  Component Value Date   VD25OH 20 (L) 05/01/2017   He has a history of testosterone deficiency and is on testosterone replacement, shot today He states that the testosterone helps with his energy, libido, muscle mass. Lab Results  Component Value Date   TESTOSTERONE 163 (L) 10/04/2016   BMI is Body mass index is 31.27 kg/m., he is working on diet and exercise. Wt Readings from Last 3 Encounters:  07/30/17 230 lb 9.6 oz (104.6 kg)  05/24/17 229 lb (103.9 kg)  05/01/17 228 lb (103.4 kg)    Current Medications:  Current Outpatient Medications on File Prior to Visit  Medication Sig Dispense Refill  . aspirin 81 MG chewable tablet  Chew by mouth daily.    . Cholecalciferol 1000 units capsule Take 5,000 Units by mouth daily.    Marland Kitchen doxazosin (CARDURA) 1 MG tablet Take 1 tablet (1 mg total) by mouth daily. 90 tablet 0  . metFORMIN (GLUCOPHAGE-XR) 500 MG 24 hr tablet TAKE TWO TABLETS BY MOUTH TWICE DAILY 360 tablet 1  . olmesartan (BENICAR) 40 MG tablet Take 1/2-1 tab daily by mouth for blood pressure. Goal is under 130/80. 90 tablet 2  . ranitidine (ZANTAC) 300 MG tablet TAKE ONE TABLET  BY MOUTH ONCE DAILY FOR  ACID  REFLUX 90 tablet 1  . testosterone cypionate (DEPOTESTOSTERONE CYPIONATE) 200 MG/ML injection INJECT TWO ML (CC) INTRAMUSCULARLY ONCE EVERY 2 WEEKS 10 mL 1  . zinc gluconate 50 MG tablet Take 50 mg by mouth daily.     Current Facility-Administered Medications on File Prior to Visit  Medication Dose Route Frequency Provider Last Rate Last Dose  . testosterone cypionate (DEPOTESTOSTERONE CYPIONATE) injection 200 mg  200 mg Intramuscular Q14 Days Quentin Mulling, PA-C   200 mg at 05/23/17 1431  . testosterone cypionate (DEPOTESTOSTERONE CYPIONATE) injection 400 mg  400 mg Intramuscular Once Lucky Cowboy, MD       Medical History:  Past Medical History:  Diagnosis Date  . GERD (gastroesophageal reflux disease)   . Hyperlipidemia   . Hypertension   . Other testicular hypofunction   . Prediabetes   . Vitamin D deficiency    Allergies:  Allergies  Allergen Reactions  . Codeine     REACTION: Anxiety     Review of Systems:  Review of Systems  Constitutional: Negative for chills, diaphoresis, fever, malaise/fatigue and weight loss.  HENT: Negative for congestion, ear discharge, ear pain, hearing loss, nosebleeds, sore throat and tinnitus.   Eyes: Negative for blurred vision, double vision, photophobia, pain, discharge and redness.  Respiratory: Negative.  Negative for stridor.   Cardiovascular: Negative.  Negative for chest pain.  Gastrointestinal: Negative.   Genitourinary: Negative for dysuria, flank pain, frequency, hematuria and urgency.  Musculoskeletal: Negative for back pain, falls, joint pain, myalgias and neck pain.  Skin: Negative.   Neurological: Negative for dizziness, tingling, tremors, sensory change, speech change, focal weakness, seizures, loss of consciousness, weakness and headaches.  Psychiatric/Behavioral: Negative.  Negative for depression. The patient is not nervous/anxious.     Family history- Review and unchanged Social  history- Review and unchanged Physical Exam: BP 122/86   Pulse 77   Temp 97.7 F (36.5 C)   Resp 16   Ht 6' (1.829 m)   Wt 230 lb 9.6 oz (104.6 kg)   SpO2 97%   BMI 31.27 kg/m  Wt Readings from Last 3 Encounters:  07/30/17 230 lb 9.6 oz (104.6 kg)  05/24/17 229 lb (103.9 kg)  05/01/17 228 lb (103.4 kg)   General Appearance: Well nourished, in no apparent distress. Eyes: PERRLA, EOMs, conjunctiva no swelling or erythema Sinuses: no Frontal/maxillary tenderness ENT/Mouth: Ext aud canals clear, TMs without erythema, bulging. No erythema, swelling, or exudate on post pharynx.  Tonsils not swollen or erythematous. Hearing normal.  Neck: Supple, thyroid normal.  Respiratory: Respiratory effort normal, BS equal bilaterally without rales, rhonchi, wheezing or stridor.  Cardio: RRR with no MRGs. Brisk peripheral pulses without edema.  Abdomen: Soft, + BS.  Non tender, no guarding, rebound, hernias, masses. Lymphatics: Non tender without lymphadenopathy.  Musculoskeletal: Full ROM, 5/5 strength, Normal gait Skin: Warm, dry without rashes, lesions, ecchymosis.  Neuro: Cranial nerves intact. No cerebellar symptoms.  Psych: Awake and oriented X 3, normal affect, Insight and Judgment appropriate.    Quentin Mulling, PA-C 8:42 AM [facility]

## 2017-07-30 ENCOUNTER — Encounter: Payer: Self-pay | Admitting: Physician Assistant

## 2017-07-30 ENCOUNTER — Ambulatory Visit: Payer: BC Managed Care – PPO | Admitting: Physician Assistant

## 2017-07-30 VITALS — BP 122/86 | HR 77 | Temp 97.7°F | Resp 16 | Ht 72.0 in | Wt 230.6 lb

## 2017-07-30 DIAGNOSIS — E349 Endocrine disorder, unspecified: Secondary | ICD-10-CM

## 2017-07-30 DIAGNOSIS — N182 Chronic kidney disease, stage 2 (mild): Secondary | ICD-10-CM

## 2017-07-30 DIAGNOSIS — E1122 Type 2 diabetes mellitus with diabetic chronic kidney disease: Secondary | ICD-10-CM

## 2017-07-30 DIAGNOSIS — I10 Essential (primary) hypertension: Secondary | ICD-10-CM | POA: Diagnosis not present

## 2017-07-30 DIAGNOSIS — E782 Mixed hyperlipidemia: Secondary | ICD-10-CM | POA: Diagnosis not present

## 2017-07-30 MED ORDER — TESTOSTERONE CYPIONATE 200 MG/ML IM SOLN
200.0000 mg | INTRAMUSCULAR | Status: DC
  Administered 2017-07-30: 200 mg via INTRAMUSCULAR

## 2017-07-30 NOTE — Patient Instructions (Addendum)
Vitamin D goal is between 60-80  Please make sure that you are taking your Vitamin D as directed.   It is very important as a natural anti-inflammatory   helping hair, skin, and nails, as well as reducing stroke and heart attack risk.   It helps your bones and helps with mood.  We want you on at least 5000 IU daily  It also decreases numerous cancer risks so please take it as directed.   Low Vit D is associated with a 200-300% higher risk for CANCER   and 200-300% higher risk for HEART   ATTACK  &  STROKE.    .....................................Marland Kitchen  It is also associated with higher death rate at younger ages,   autoimmune diseases like Rheumatoid arthritis, Lupus, Multiple Sclerosis.     Also many other serious conditions, like depression, Alzheimer's  Dementia, infertility, muscle aches, fatigue, fibromyalgia - just to name a few.  +++++++++++++++++++  Can get liquid vitamin D from [place]  OR here in [place] at  [facility] alternatives [address] [place], [place] [address] Or you can try earth fare   Being dehydrated can hurt your kidneys, cause fatigue, headaches, muscle aches, joint pain, and dry skin/nails so please increase your fluids.   Drink 80-100 oz a day of water, measure it out! Eat 3 meals a day, have to do breakfast, eat protein- hard boiled eggs, protein bar like nature valley protein bar, greek yogurt like oikos triple zero, chobani 100, or light n fit greek  Diet soda leads to weight gain.  We recently discovered that the artifical sugar in the soda stops an enzyme in your stomach that is suppose to signal that your brain is full. So patients that drink a lot of diet soda will never feel full and tend to over eat. So please cut back on diet soda and it can help with your weight loss.      When it comes to diets, agreement about the perfect plan isn't easy to find, even among the experts. Experts at the [facility] [employer] developed an idea  known as the Healthy Eating Plate. Just imagine a plate divided into logical, healthy portions.  The emphasis is on diet quality:  Load up on vegetables and fruits - one-half of your plate: Aim for color and variety, and remember that potatoes don't count.  Go for whole grains - one-quarter of your plate: Whole wheat, barley, wheat berries, quinoa, oats, brown rice, and foods made with them. If you want pasta, go with whole wheat pasta.  Protein power - one-quarter of your plate: Fish, chicken, beans, and nuts are all healthy, versatile protein sources. Limit red meat.  The diet, however, does go beyond the plate, offering a few other suggestions.  Use healthy plant oils, such as olive, canola, soy, corn, sunflower and peanut. Check the labels, and avoid partially hydrogenated oil, which have unhealthy trans fats.  If you're thirsty, drink water. Coffee and tea are good in moderation, but skip sugary drinks and limit milk and dairy products to one or two daily servings.  The type of carbohydrate in the diet is more important than the amount. Some sources of carbohydrates, such as vegetables, fruits, whole grains, and beans-are healthier than others.  Finally, stay active.   Can do zinc 40-50 mg a day Can try shake that is whey protein, almond milk, avocado oil, and spinach and strawberries in the morning  9 Ways to Naturally Increase Testosterone Levels  1.  Lose Weight If you're overweight, shedding the excess pounds may increase your testosterone levels, according to research presented at the Endocrine Society's 2012 meeting. Overweight men are more likely to have low testosterone levels to begin with, so this is an important trick to increase your body's testosterone production when you need it most.  2.   High-Intensity Exercise like Peak Fitness  Short intense exercise has a proven positive effect on increasing testosterone levels and preventing its decline. That's unlike aerobics  or prolonged moderate exercise, which have shown to have negative or no effect on testosterone levels. Having a whey protein meal after exercise can further enhance the satiety/testosterone-boosting impact (hunger hormones cause the opposite effect on your testosterone and libido). Here's a summary of what a typical high-intensity Peak Fitness routine might look like: " Warm up for three minutes  " Exercise as hard and fast as you can for 30 seconds. You should feel like you couldn't possibly go on another few seconds  " Recover at a slow to moderate pace for 90 seconds  " Repeat the high intensity exercise and recovery 7 more times .  3.   Consume Plenty of Zinc The mineral zinc is important for testosterone production, and supplementing your diet for as little as six weeks has been shown to cause a marked improvement in testosterone among men with low levels.1 Likewise, research has shown that restricting dietary sources of zinc leads to a significant decrease in testosterone, while zinc supplementation increases it2 -- and even protects men from exercised-induced reductions in testosterone levels.3 It's estimated that up to 45 percent of adults over the age of 60 may have lower than recommended zinc intakes; even when dietary supplements were added in, an estimated 20-25 percent of older adults still had inadequate zinc intakes, according to a [employer] and Nutrition Examination Survey.4 Your diet is the best source of zinc; along with protein-rich foods like meats and fish, other good dietary sources of zinc include raw milk, raw cheese, beans, and yogurt or kefir made from raw milk. It can be difficult to obtain enough dietary zinc if you're a vegetarian, and also for meat-eaters as well, largely because of conventional farming methods that rely heavily on chemical fertilizers and pesticides. These chemicals deplete the soil of nutrients ... nutrients like zinc that must be absorbed by plants in  order to be passed on to you. In many cases, you may further deplete the nutrients in your food by the way you prepare it. For most food, cooking it will drastically reduce its levels of nutrients like zinc ... particularly over-cooking, which many people do. If you decide to use a zinc supplement, stick to a dosage of less than 40 mg a day, as this is the recommended adult upper limit. Taking too much zinc can interfere with your body's ability to absorb other minerals, especially copper, and may cause nausea as a side effect.  4.   Strength Training In addition to Peak Fitness, strength training is also known to boost testosterone levels, provided you are doing so intensely enough. When strength training to boost testosterone, you'll want to increase the weight and lower your number of reps, and then focus on exercises that work a large number of muscles, such as dead lifts or squats.  You can "turbo-charge" your weight training by going slower. By slowing down your movement, you're actually turning it into a high-intensity exercise. Super Slow movement allows your muscle, at the microscopic level, to access  the maximum number of cross-bridges between the protein filaments that produce movement in the muscle.   5.   Optimize Your Vitamin D Levels Vitamin D, a steroid hormone, is essential for the healthy development of the nucleus of the sperm cell, and helps maintain semen quality and sperm count. Vitamin D also increases levels of testosterone, which may boost libido. In one study, overweight men who were given vitamin D supplements had a significant increase in testosterone levels after one year.5   6.   Reduce Stress When you're under a lot of stress, your body releases high levels of the stress hormone cortisol. This hormone actually blocks the effects of testosterone,6 presumably because, from a biological standpoint, testosterone-associated behaviors (mating, competing, aggression) may have  lowered your chances of survival in an emergency (hence, the "fight or flight" response is dominant, courtesy of cortisol).  7.   Limit or Eliminate Sugar from Your Diet Testosterone levels decrease after you eat sugar, which is likely because the sugar leads to a high insulin level, another factor leading to low testosterone.7 Based on USDA estimates, the average American consumes 12 teaspoons of sugar a day, which equates to about TWO TONS of sugar during a lifetime.  8.   Eat Healthy Fats By healthy, this means not only mon- and polyunsaturated fats, like that found in avocadoes and nuts, but also saturated, as these are essential for building testosterone. Research shows that a diet with less than 40 percent of energy as fat (and that mainly from animal sources, i.e. saturated) lead to a decrease in testosterone levels.8 My personal diet is about 60-70 percent healthy fat, and other experts agree that the ideal diet includes somewhere between 50-70 percent fat.  It's important to understand that your body requires saturated fats from animal and vegetable sources (such as meat, dairy, certain oils, and tropical plants like coconut) for optimal functioning, and if you neglect this important food group in favor of sugar, grains and other starchy carbs, your health and weight are almost guaranteed to suffer. Examples of healthy fats you can eat more of to give your testosterone levels a boost include: Olives and Olive oil  Coconuts and coconut oil Butter made from raw grass-fed organic milk Raw nuts, such as, almonds or pecans Organic pastured egg yolks Avocados Grass-fed meats Palm oil Unheated organic nut oils   9.   Boost Your Intake of Branch Chain Amino Acids (BCAA) from Foods Like Whey Protein Research suggests that BCAAs result in higher testosterone levels, particularly when taken along with resistance training.9 While BCAAs are available in supplement form, you'll find the highest  concentrations of BCAAs like leucine in dairy products - especially quality cheeses and whey protein. Even when getting leucine from your natural food supply, it's often wasted or used as a building block instead of an anabolic agent. So to create the correct anabolic environment, you need to boost leucine consumption way beyond mere maintenance levels. That said, keep in mind that using leucine as a free form amino acid can be highly counterproductive as when free form amino acids are artificially administrated, they rapidly enter your circulation while disrupting insulin function, and impairing your body's glycemic control. Food-based leucine is really the ideal form that can benefit your muscles without side effects.

## 2017-07-31 LAB — COMPLETE METABOLIC PANEL WITH GFR
AG RATIO: 1.6 (calc) (ref 1.0–2.5)
ALKALINE PHOSPHATASE (APISO): 97 U/L (ref 40–115)
ALT: 24 U/L (ref 9–46)
AST: 16 U/L (ref 10–35)
Albumin: 4.1 g/dL (ref 3.6–5.1)
BILIRUBIN TOTAL: 0.6 mg/dL (ref 0.2–1.2)
BUN: 15 mg/dL (ref 7–25)
CHLORIDE: 102 mmol/L (ref 98–110)
CO2: 29 mmol/L (ref 20–32)
Calcium: 9 mg/dL (ref 8.6–10.3)
Creat: 1.13 mg/dL (ref 0.70–1.25)
GFR, EST AFRICAN AMERICAN: 80 mL/min/{1.73_m2} (ref >=60)
GFR, Est Non African American: 69 mL/min/{1.73_m2} (ref >=60)
Globulin: 2.5 g/dL (calc) (ref 1.9–3.7)
Glucose, Bld: 291 mg/dL — ABNORMAL HIGH (ref 65–99)
POTASSIUM: 4.3 mmol/L (ref 3.5–5.3)
Sodium: 137 mmol/L (ref 135–146)
TOTAL PROTEIN: 6.6 g/dL (ref 6.1–8.1)

## 2017-07-31 LAB — CBC WITH DIFFERENTIAL/PLATELET
BASOS ABS: 50 {cells}/uL (ref 0–200)
BASOS PCT: 0.8 %
EOS ABS: 120 {cells}/uL (ref 15–500)
Eosinophils Relative: 1.9 %
HCT: 43.3 % (ref 38.5–50.0)
Hemoglobin: 14.7 g/dL (ref 13.2–17.1)
Lymphs Abs: 1487 cells/uL (ref 850–3900)
MCH: 29.5 pg (ref 27.0–33.0)
MCHC: 33.9 g/dL (ref 32.0–36.0)
MCV: 86.8 fL (ref 80.0–100.0)
MPV: 10.8 fL (ref 7.5–12.5)
Monocytes Relative: 7.7 %
NEUTROS PCT: 66 %
Neutro Abs: 4158 cells/uL (ref 1500–7800)
Platelets: 217 10*3/uL (ref 140–400)
RBC: 4.99 10*6/uL (ref 4.20–5.80)
RDW: 12.6 % (ref 11.0–15.0)
TOTAL LYMPHOCYTE: 23.6 %
WBC: 6.3 10*3/uL (ref 3.8–10.8)
WBCMIX: 485 {cells}/uL (ref 200–950)

## 2017-07-31 LAB — LIPID PANEL
CHOLESTEROL: 213 mg/dL — AB (ref <200)
HDL: 37 mg/dL — ABNORMAL LOW (ref >40)
LDL Cholesterol (Calc): 135 mg/dL (calc) — ABNORMAL HIGH
Non-HDL Cholesterol (Calc): 176 mg/dL (calc) — ABNORMAL HIGH (ref <130)
TRIGLYCERIDES: 269 mg/dL — AB (ref <150)
Total CHOL/HDL Ratio: 5.8 (calc) — ABNORMAL HIGH (ref <5.0)

## 2017-07-31 LAB — HEMOGLOBIN A1C
HEMOGLOBIN A1C: 8.3 %{Hb} — AB (ref <5.7)
MEAN PLASMA GLUCOSE: 192 (calc)
eAG (mmol/L): 10.6 (calc)

## 2017-07-31 LAB — TSH: TSH: 1.4 m[IU]/L (ref 0.40–4.50)

## 2017-08-01 ENCOUNTER — Other Ambulatory Visit: Payer: Self-pay | Admitting: Physician Assistant

## 2017-08-01 MED ORDER — ROSUVASTATIN CALCIUM 10 MG PO TABS: ORAL_TABLET | ORAL | 3 refills | Status: DC

## 2017-09-14 ENCOUNTER — Other Ambulatory Visit: Payer: Self-pay | Admitting: *Deleted

## 2017-09-14 ENCOUNTER — Ambulatory Visit: Payer: BC Managed Care – PPO

## 2017-09-14 ENCOUNTER — Other Ambulatory Visit: Payer: Self-pay

## 2017-09-14 ENCOUNTER — Other Ambulatory Visit: Payer: Self-pay | Admitting: Internal Medicine

## 2017-09-14 DIAGNOSIS — E349 Endocrine disorder, unspecified: Secondary | ICD-10-CM

## 2017-09-14 DIAGNOSIS — I10 Essential (primary) hypertension: Secondary | ICD-10-CM

## 2017-09-14 MED ORDER — TAMSULOSIN HCL 0.4 MG PO CAPS: 0.4000 mg | ORAL_CAPSULE | Freq: Every day | ORAL | 0 refills | Status: DC

## 2017-09-14 MED ORDER — PREDNISONE 20 MG PO TABS: ORAL_TABLET | ORAL | 0 refills | Status: DC

## 2017-09-14 MED ORDER — TESTOSTERONE CYPIONATE 200 MG/ML IM SOLN
200.0000 mg | Freq: Once | INTRAMUSCULAR | Status: AC
  Administered 2017-09-14: 200 mg via INTRAMUSCULAR

## 2017-09-14 MED ORDER — AZITHROMYCIN 250 MG PO TABS: ORAL_TABLET | ORAL | 1 refills | Status: DC

## 2017-09-14 MED ORDER — BISOPROLOL-HYDROCHLOROTHIAZIDE 5-6.25 MG PO TABS: 1.0000 | ORAL_TABLET | Freq: Every day | ORAL | 0 refills | Status: DC

## 2017-09-14 MED ORDER — TESTOSTERONE CYPIONATE 200 MG/ML IM SOLN: INTRAMUSCULAR | 1 refills | Status: DC

## 2017-09-14 NOTE — Progress Notes (Signed)
Patient here for a nurse visit for Testosterone Cypionate 200 mg/ml 2 ml IM in the LEFT upper outer quadrant. Patient will return in 2 weeks for his next injection.

## 2017-09-14 NOTE — Addendum Note (Signed)
Addended by: Dionicio StallUCKER, Helon Wisinski on: 09/14/2017 10:08 AM   Modules accepted: Orders

## 2017-09-14 NOTE — Telephone Encounter (Signed)
Testosterone refill request.  °

## 2017-10-04 ENCOUNTER — Encounter: Payer: Self-pay | Admitting: Physician Assistant

## 2017-10-10 ENCOUNTER — Encounter: Payer: Self-pay | Admitting: Adult Health

## 2017-10-10 ENCOUNTER — Ambulatory Visit: Payer: BC Managed Care – PPO | Admitting: Adult Health

## 2017-10-10 VITALS — BP 130/78 | HR 64 | Temp 97.4°F | Ht 72.0 in | Wt 229.0 lb

## 2017-10-10 DIAGNOSIS — J340 Abscess, furuncle and carbuncle of nose: Secondary | ICD-10-CM | POA: Diagnosis not present

## 2017-10-10 MED ORDER — DOXYCYCLINE HYCLATE 100 MG PO CAPS
ORAL_CAPSULE | ORAL | 0 refills | Status: DC
Start: 2017-10-10 — End: 2017-11-13

## 2017-10-10 NOTE — Patient Instructions (Addendum)
Doxycycline tablets or capsules What is this medicine? DOXYCYCLINE (dox i SYE kleen) is a tetracycline antibiotic. It kills certain bacteria or stops their growth. It is used to treat many kinds of infections, like dental, skin, respiratory, and urinary tract infections. It also treats acne, Lyme disease, malaria, and certain sexually transmitted infections. This medicine may be used for other purposes; ask your health care provider or pharmacist if you have questions. COMMON BRAND NAME(S): Acticlate, Adoxa, Adoxa CK, Adoxa Pak, Adoxa TT, Alodox, Avidoxy, Doxal, Mondoxyne NL, Monodox, Morgidox 1x, Morgidox 1x Kit, Morgidox 2x, Morgidox 2x Kit, NutriDox, Ocudox, TARGADOX, Vibra-Tabs, Vibramycin What should I tell my health care provider before I take this medicine? They need to know if you have any of these conditions: -liver disease -long exposure to sunlight like working outdoors -stomach problems like colitis -an unusual or allergic reaction to doxycycline, tetracycline antibiotics, other medicines, foods, dyes, or preservatives -pregnant or trying to get pregnant -breast-feeding How should I use this medicine? Take this medicine by mouth with a full glass of water. Follow the directions on the prescription label. It is best to take this medicine without food, but if it upsets your stomach take it with food. Take your medicine at regular intervals. Do not take your medicine more often than directed. Take all of your medicine as directed even if you think you are better. Do not skip doses or stop your medicine early. Talk to your pediatrician regarding the use of this medicine in children. While this drug may be prescribed for selected conditions, precautions do apply. Overdosage: If you think you have taken too much of this medicine contact a poison control center or emergency room at once. NOTE: This medicine is only for you. Do not share this medicine with others. What if I miss a dose? If you  miss a dose, take it as soon as you can. If it is almost time for your next dose, take only that dose. Do not take double or extra doses. What may interact with this medicine? -antacids -barbiturates -birth control pills -bismuth subsalicylate -carbamazepine -methoxyflurane -other antibiotics -phenytoin -vitamins that contain iron -warfarin This list may not describe all possible interactions. Give your health care provider a list of all the medicines, herbs, non-prescription drugs, or dietary supplements you use. Also tell them if you smoke, drink alcohol, or use illegal drugs. Some items may interact with your medicine. What should I watch for while using this medicine? Tell your doctor or health care professional if your symptoms do not improve. Do not treat diarrhea with over the counter products. Contact your doctor if you have diarrhea that lasts more than 2 days or if it is severe and watery. Do not take this medicine just before going to bed. It may not dissolve properly when you lay down and can cause pain in your throat. Drink plenty of fluids while taking this medicine to also help reduce irritation in your throat. This medicine can make you more sensitive to the sun. Keep out of the sun. If you cannot avoid being in the sun, wear protective clothing and use sunscreen. Do not use sun lamps or tanning beds/booths. Birth control pills may not work properly while you are taking this medicine. Talk to your doctor about using an extra method of birth control. If you are being treated for a sexually transmitted infection, avoid sexual contact until you have finished your treatment. Your sexual partner may also need treatment. Avoid antacids, aluminum, calcium, magnesium, and   iron products for 4 hours before and 2 hours after taking a dose of this medicine. If you are using this medicine to prevent malaria, you should still protect yourself from contact with mosquitos. Stay in screened-in  areas, use mosquito nets, keep your body covered, and use an insect repellent. What side effects may I notice from receiving this medicine? Side effects that you should report to your doctor or health care professional as soon as possible: -allergic reactions like skin rash, itching or hives, swelling of the face, lips, or tongue -difficulty breathing -fever -itching in the rectal or genital area -pain on swallowing -redness, blistering, peeling or loosening of the skin, including inside the mouth -severe stomach pain or cramps -unusual bleeding or bruising -unusually weak or tired -yellowing of the eyes or skin Side effects that usually do not require medical attention (report to your doctor or health care professional if they continue or are bothersome): -diarrhea -loss of appetite -nausea, vomiting This list may not describe all possible side effects. Call your doctor for medical advice about side effects. You may report side effects to FDA at 1-800-FDA-1088. Where should I keep my medicine? Keep out of the reach of children. Store at room temperature, below 30 degrees C (86 degrees F). Protect from light. Keep container tightly closed. Throw away any unused medicine after the expiration date. Taking this medicine after the expiration date can make you seriously ill. NOTE: This sheet is a summary. It may not cover all possible information. If you have questions about this medicine, talk to your doctor, pharmacist, or health care provider.  2018 Elsevier/Gold Standard (2015-04-14 17:11:22)  

## 2017-10-10 NOTE — Progress Notes (Signed)
Assessment and Plan:  Antiono was seen today for facial pain.  Diagnoses and all orders for this visit:  Cellulitis of nose Inner mucosa unremarkable, ? mild cellulitis of right nare Continue to monitor, call if not improving or getting worse, or develops fever/chills/headache/vision changes Stop using q-tip, avoid hydrogen peroxide on mucus membranes -     doxycycline (VIBRAMYCIN) 100 MG capsule; Take 1 capsule 2 x/day with food for 10 days.  Further disposition pending results of labs. Discussed med's effects and SE's.   Over 15 minutes of exam, counseling, chart review, and critical decision making was performed.   Future Appointments  Date Time Provider Department Center  10/10/2017 10:30 AM Judd Gaudier, NP [facility] None  12/10/2017 10:00 AM Quentin Mulling, PA-C [facility] None    ------------------------------------------------------------------------------------------------------------------  HPI BP 130/78   Pulse 64   Temp (!) 97.4 F (36.3 C)   Ht 6' (1.829 m)   Wt 229 lb (103.9 kg)   SpO2 96%   BMI 31.06 kg/m   62 y.o.male presents for c/o right inner nare pain ongoing for 1 month or so. He reports soreness, off and on, was on zpak and prednisone but that didn't help. He has been applying vaseline, peroxide with q-tip. He denies any other symptoms, no discharge, allergies, runny nose or congestion. Denies headache, fever/chills.   Past Medical History:  Diagnosis Date  . GERD (gastroesophageal reflux disease)   . Hyperlipidemia   . Hypertension   . Other testicular hypofunction   . Prediabetes   . Vitamin D deficiency      Allergies  Allergen Reactions  . Codeine     REACTION: Anxiety    Current Outpatient Medications on File Prior to Visit  Medication Sig  . aspirin 81 MG chewable tablet Chew by mouth daily.  Marland Kitchen azithromycin (ZITHROMAX) 250 MG tablet Take 2 tablets (500 mg) on  Day 1,  followed by 1 tablet (250 mg) once daily on Days 2 through 5.   . bisoprolol-hydrochlorothiazide (ZIAC) 5-6.25 MG tablet Take 1 tablet by mouth daily.  . Cholecalciferol 1000 units capsule Take 5,000 Units by mouth daily.  . metFORMIN (GLUCOPHAGE-XR) 500 MG 24 hr tablet TAKE TWO TABLETS BY MOUTH TWICE DAILY  . predniSONE (DELTASONE) 20 MG tablet 1 tab 3 x day for 2 days, then 1 tab 2 x day for 2 days, then 1 tab 1 x day for 3 days  . ranitidine (ZANTAC) 300 MG tablet TAKE ONE TABLET BY MOUTH ONCE DAILY FOR  ACID  REFLUX  . rosuvastatin (CRESTOR) 10 MG tablet Start 1/2 pill at bed time, will recheck chol 3 months and may increase to 1 pill at that time.  . tamsulosin (FLOMAX) 0.4 MG CAPS capsule Take 1 capsule (0.4 mg total) by mouth daily.  Marland Kitchen testosterone cypionate (DEPOTESTOSTERONE CYPIONATE) 200 MG/ML injection INJECT TWO ML (CC) INTRAMUSCULARLY ONCE EVERY 2 WEEKS  . zinc gluconate 50 MG tablet Take 50 mg by mouth daily.   Current Facility-Administered Medications on File Prior to Visit  Medication  . testosterone cypionate (DEPOTESTOSTERONE CYPIONATE) injection 200 mg  . testosterone cypionate (DEPOTESTOSTERONE CYPIONATE) injection 200 mg  . testosterone cypionate (DEPOTESTOSTERONE CYPIONATE) injection 400 mg    ROS: all negative except above.   Physical Exam:  BP 130/78   Pulse 64   Temp (!) 97.4 F (36.3 C)   Ht 6' (1.829 m)   Wt 229 lb (103.9 kg)   SpO2 96%   BMI 31.06 kg/m   General Appearance:  Well nourished, in no apparent distress. Eyes: PERRLA, EOMs, conjunctiva no swelling or erythema Sinuses: No Frontal/maxillary tenderness ENT/Mouth: . No erythema, swelling, or exudate on post pharynx.  Tonsils not swollen or erythematous. Hearing normal. Tip and right nare appears mildly injected and inflamed; nasal turbinates without edema, membranes not ulcerated, no discharge Neck: Supple Respiratory: Respiratory effort normal Cardio: RRR with no MRGs.  Abdomen: Soft, + BS.   Lymphatics: Non tender without lymphadenopathy.   Musculoskeletal: normal gait.  Skin: Warm, dry without rashes, lesions, ecchymosis other than above Neuro: Cranial nerves intact.  Psych: Awake and oriented X 3, normal affect, Insight and Judgment appropriate.     Dan MakerAshley C Anastasya Jewell, NP 10:29 AM [facility]

## 2017-10-12 ENCOUNTER — Telehealth: Payer: Self-pay

## 2017-10-12 NOTE — Telephone Encounter (Signed)
Left detailed message on voicemail.  

## 2017-10-12 NOTE — Telephone Encounter (Signed)
Feels that the antibiotic isn't doing anything, said he thought he should feel better. Please advise

## 2017-10-24 ENCOUNTER — Other Ambulatory Visit: Payer: Self-pay | Admitting: *Deleted

## 2017-10-24 DIAGNOSIS — E349 Endocrine disorder, unspecified: Secondary | ICD-10-CM

## 2017-10-24 MED ORDER — TESTOSTERONE CYPIONATE 200 MG/ML IM SOLN: INTRAMUSCULAR | 1 refills | Status: DC

## 2017-10-29 ENCOUNTER — Ambulatory Visit: Payer: BC Managed Care – PPO

## 2017-10-29 DIAGNOSIS — E349 Endocrine disorder, unspecified: Secondary | ICD-10-CM

## 2017-10-29 MED ORDER — TESTOSTERONE CYPIONATE 200 MG/ML IM SOLN
200.0000 mg | INTRAMUSCULAR | Status: DC
  Administered 2017-10-29: 200 mg via INTRAMUSCULAR

## 2017-10-29 NOTE — Progress Notes (Signed)
Patient here for a nurse visit for Testosterone Cypionate 200 mg/ml 2 ml IM in the RIGHTupper outer quadrant. Patient will return in 2 weeks for his next injection.

## 2017-11-13 ENCOUNTER — Telehealth: Payer: Self-pay

## 2017-11-13 ENCOUNTER — Other Ambulatory Visit: Payer: Self-pay | Admitting: Adult Health

## 2017-11-13 MED ORDER — SULFAMETHOXAZOLE-TRIMETHOPRIM 800-160 MG PO TABS: 1.0000 | ORAL_TABLET | Freq: Two times a day (BID) | ORAL | 0 refills | Status: DC

## 2017-11-13 NOTE — Telephone Encounter (Signed)
Patient notified

## 2017-11-13 NOTE — Telephone Encounter (Signed)
Nose is swollen, was seen on 07/17 for this. Said it's happening again and would like to know if we can prescribe something different/stronger or another round an meds?

## 2017-11-24 ENCOUNTER — Other Ambulatory Visit: Payer: Self-pay | Admitting: Internal Medicine

## 2017-11-24 ENCOUNTER — Emergency Department (HOSPITAL_COMMUNITY)
Admission: EM | Admit: 2017-11-24 | Discharge: 2017-11-24 | Disposition: A | Discharge status: Home / Self Care | Payer: BC Managed Care – PPO | Attending: Emergency Medicine | Admitting: Emergency Medicine

## 2017-11-24 ENCOUNTER — Emergency Department (HOSPITAL_COMMUNITY): Payer: BC Managed Care – PPO

## 2017-11-24 ENCOUNTER — Encounter (HOSPITAL_COMMUNITY): Payer: Self-pay | Admitting: Emergency Medicine

## 2017-11-24 DIAGNOSIS — E1122 Type 2 diabetes mellitus with diabetic chronic kidney disease: Secondary | ICD-10-CM | POA: Insufficient documentation

## 2017-11-24 DIAGNOSIS — R0789 Other chest pain: Secondary | ICD-10-CM | POA: Diagnosis present

## 2017-11-24 DIAGNOSIS — Z7982 Long term (current) use of aspirin: Secondary | ICD-10-CM | POA: Insufficient documentation

## 2017-11-24 DIAGNOSIS — I129 Hypertensive chronic kidney disease with stage 1 through stage 4 chronic kidney disease, or unspecified chronic kidney disease: Secondary | ICD-10-CM | POA: Diagnosis not present

## 2017-11-24 DIAGNOSIS — N182 Chronic kidney disease, stage 2 (mild): Secondary | ICD-10-CM | POA: Insufficient documentation

## 2017-11-24 DIAGNOSIS — Z79899 Other long term (current) drug therapy: Secondary | ICD-10-CM | POA: Diagnosis not present

## 2017-11-24 LAB — BASIC METABOLIC PANEL
ANION GAP: 8 (ref 5–15)
BUN: 11 mg/dL (ref 8–23)
CALCIUM: 9 mg/dL (ref 8.9–10.3)
CO2: 27 mmol/L (ref 22–32)
Chloride: 101 mmol/L (ref 98–111)
Creatinine, Ser: 1.09 mg/dL (ref 0.61–1.24)
GLUCOSE: 271 mg/dL — AB (ref 70–99)
POTASSIUM: 4.2 mmol/L (ref 3.5–5.1)
Sodium: 136 mmol/L (ref 135–145)

## 2017-11-24 LAB — CBC
HCT: 45.1 % (ref 39.0–52.0)
HEMOGLOBIN: 14.4 g/dL (ref 13.0–17.0)
MCH: 28.5 pg (ref 26.0–34.0)
MCHC: 31.9 g/dL (ref 30.0–36.0)
MCV: 89.3 fL (ref 78.0–100.0)
PLATELETS: 200 10*3/uL (ref 150–400)
RBC: 5.05 MIL/uL (ref 4.22–5.81)
RDW: 12.8 % (ref 11.5–15.5)
WBC: 5.3 10*3/uL (ref 4.0–10.5)

## 2017-11-24 LAB — I-STAT TROPONIN, ED
TROPONIN I, POC: 0.01 ng/mL (ref 0.00–0.08)
Troponin i, poc: 0 ng/mL (ref 0.00–0.08)

## 2017-11-24 NOTE — ED Triage Notes (Signed)
Pt states he woke up with chest pain that radiated to his back and jaw this am. Called pcp and was told to come here, endorses some sob and fatigue that has been ongoing. resp e/u, nad

## 2017-11-24 NOTE — Discharge Instructions (Signed)
Take ibuprofen or Tylenol as needed for pain.  Follow-up with your primary care physician or cardiologist for further work-up.  Return to the emergency department if any concerning signs or symptoms develop such as fevers, chest pain, shortness of breath, persistent vomiting.

## 2017-11-24 NOTE — ED Provider Notes (Signed)
[facility] EMERGENCY DEPARTMENT Provider Note   CSN: [id number] Arrival date & time: 11/24/17  0930     History   Chief Complaint Chief Complaint  Patient presents with  . Chest Pain    HPI Mark [name] is a 62 y.o. male with history of GERD, HLD, HTN, diabetes, and CKD presents for evaluation of acute onset, resolved episode of chest pain.  He states that he awoke from his sleep at around 5 AM experiencing dull pain between his shoulder blades into the right side of the jaw.  Pain did not radiate to the chest.  Pain was constant, improved with ambulation and after taking 6 baby aspirin completely resolved.  He denies any associated shortness of breath, nausea, vomiting, diaphoresis, or lightheadedness.  No syncope.  No fevers or chills.  He denies cough.  No leg swelling, recent travel or surgeries, no hemoptysis, no prior history of DVT or PE.  He is a non-smoker, denies excessive alcohol intake or recreational drug use.  He called his PCP who instructed him to present to the ED for further evaluation.  Last stress test was over 10 years ago.  The history is provided by the patient.    Past Medical History:  Diagnosis Date  . GERD (gastroesophageal reflux disease)   . Hyperlipidemia   . Hypertension   . Other testicular hypofunction   . Prediabetes   . Vitamin D deficiency     Patient Active Problem List   Diagnosis Date Noted  . Snoring 05/01/2017  . CKD stage 2 due to type 2 diabetes mellitus (HCC) 04/30/2017  . Benign prostatic hyperplasia with urinary frequency 10/04/2016  . Obesity (BMI 30.0-34.9) 07/27/2014  . Medication management 07/27/2014  . Hyperlipidemia   . Hypertension   . Type 2 diabetes mellitus (HCC)   . GERD (gastroesophageal reflux disease)   . Vitamin D deficiency   . Testosterone deficiency     Past Surgical History:  Procedure Laterality Date  . URETHRAL DILATION  2007  . VASECTOMY  01/2004        Home Medications      Prior to Admission medications   Medication Sig Start Date End Date Taking? Authorizing Provider  aspirin 81 MG chewable tablet Chew by mouth daily.    [provider]  bisoprolol-hydrochlorothiazide (ZIAC) 5-6.25 MG tablet Take 1 tablet by mouth daily. 09/14/17   Lucky Cowboy, MD  Cholecalciferol 1000 units capsule Take 5,000 Units by mouth daily.    [provider]  metFORMIN (GLUCOPHAGE-XR) 500 MG 24 hr tablet TAKE TWO TABLETS BY MOUTH TWICE DAILY 10/28/16   Lucky Cowboy, MD  ranitidine (ZANTAC) 300 MG tablet TAKE ONE TABLET BY MOUTH ONCE DAILY FOR  ACID  REFLUX 10/28/16   Lucky Cowboy, MD  rosuvastatin (CRESTOR) 10 MG tablet Start 1/2 pill at bed time, will recheck chol 3 months and may increase to 1 pill at that time. 08/01/17   Quentin Mulling, PA-C  sulfamethoxazole-trimethoprim (BACTRIM DS,SEPTRA DS) 800-160 MG tablet Take 1 tablet by mouth 2 (two) times daily. 11/13/17   Judd Gaudier, NP  tamsulosin (FLOMAX) 0.4 MG CAPS capsule Take 1 capsule (0.4 mg total) by mouth daily. 09/14/17   Lucky Cowboy, MD  testosterone cypionate (DEPOTESTOSTERONE CYPIONATE) 200 MG/ML injection INJECT TWO ML (CC) INTRAMUSCULARLY ONCE EVERY 2 WEEKS 10/24/17   Lucky Cowboy, MD  zinc gluconate 50 MG tablet Take 50 mg by mouth daily.    [provider]    Family History  Family History  Problem Relation Age of Onset  . Hypertension Mother   . Hyperlipidemia Mother   . COPD Father   . Heart disease Father   . Hypertension Father     Social History Social History   Tobacco Use  . Smoking status: Never Smoker  . Smokeless tobacco: Never Used  Substance Use Topics  . Alcohol use: No  . Drug use: No     Allergies   Codeine   Review of Systems Review of Systems  Constitutional: Negative for chills, diaphoresis and fever.  HENT:       +jaw pain   Respiratory: Negative for shortness of breath.   Cardiovascular: Negative for chest pain and leg swelling.   Gastrointestinal: Negative for abdominal pain, nausea and vomiting.  Musculoskeletal:       +pain between shoulder blades  Neurological: Negative for light-headedness.  All other systems reviewed and are negative.    Physical Exam Updated Vital Signs BP 129/81 (BP Location: Right Arm)   Pulse (!) 50   Temp 97.9 F (36.6 C) (Oral)   Resp 16   SpO2 99%   Physical Exam  Constitutional: He appears well-developed and well-nourished. No distress.  HENT:  Head: Normocephalic and atraumatic.  Eyes: Conjunctivae are normal. Right eye exhibits no discharge. Left eye exhibits no discharge.  Neck: Normal range of motion. No JVD present. No tracheal deviation present.  Cardiovascular: Normal rate and regular rhythm.  Pulses:      Carotid pulses are 2+ on the right side, and 2+ on the left side.      Radial pulses are 2+ on the right side, and 2+ on the left side.       Dorsalis pedis pulses are 2+ on the right side, and 2+ on the left side.       Posterior tibial pulses are 2+ on the right side, and 2+ on the left side.  2+ radial and DP/PT pulses bilaterally, Homans sign absent bilaterally, no lower extremity edema, no palpable cords, compartments are soft   Pulmonary/Chest: Effort normal and breath sounds normal. No accessory muscle usage. No respiratory distress.  Equal rise and fall of chest, no increased work of breathing.  Speaking in full sentences without difficulty.  No tenderness to palpation of the chest wall.  Abdominal: Soft. Bowel sounds are normal. He exhibits no distension. There is no tenderness.  Musculoskeletal: He exhibits no edema.       Right lower leg: Normal. He exhibits no tenderness and no edema.       Left lower leg: Normal. He exhibits no tenderness and no edema.  Neurological: He is alert.  Skin: Skin is warm and dry. No erythema.  Psychiatric: He has a normal mood and affect. His behavior is normal.  Nursing note and vitals reviewed.    ED Treatments /  Results  Labs (all labs ordered are listed, but only abnormal results are displayed) Labs Reviewed  BASIC METABOLIC PANEL - Abnormal; Notable for the following components:      Result Value   Glucose, Bld 271 (*)    All other components within normal limits  CBC  I-STAT TROPONIN, ED  I-STAT TROPONIN, ED    EKG EKG Interpretation  Date/Time:  Saturday November 24 2017 09:33:21 EDT Ventricular Rate:  64 PR Interval:  152 QRS Duration: 96 QT Interval:  404 QTC Calculation: 416 R Axis:   22 Text Interpretation:  Sinus rhythm with Premature atrial complexes Nonspecific T wave abnormality  Abnormal ECG Confirmed by Bethann Berkshire [phone]) on 11/24/2017 1:23:30 PM Also confirmed by Bethann Berkshire [phone])  on 11/24/2017 1:24:57 PM   Radiology Dg Chest 2 View  Result Date: 11/24/2017 CLINICAL DATA:  Central chest pain radiating to the back and right jaw since this morning. EXAM: CHEST - 2 VIEW COMPARISON:  01/23/2006 FINDINGS: The heart size and mediastinal contours are within normal limits. Both lungs are clear. No pleural effusion or pneumothorax. The visualized skeletal structures are unremarkable. IMPRESSION: No active cardiopulmonary disease. Electronically Signed   By: Amie Portland M.D.   On: 11/24/2017 10:35    Procedures Procedures (including critical care time)  Medications Ordered in ED Medications - No data to display   Initial Impression / Assessment and Plan / ED Course  I have reviewed the triage vital signs and the nursing notes.  Pertinent labs & imaging results that were available during my care of the patient were reviewed by me and considered in my medical decision making (see chart for details).    Patient presents from the ED for chest pain work-up at the request of his PCP.  He is afebrile, initially hypertensive with complete resolution on reevaluation.  He complained of pain between his shoulder blades and to the right side of the jaw but denied any chest pain  when his symptoms were occurring.  He denies any chest pain at this time.  EKG shows normal sinus rhythm with occasional PAC, no ischemic changes or ST segment abnormality or arrhythmia noted.  Chest x-ray shows no acute cardiopulmonary abnormalities, no evidence of edema or consolidation.  Lab work reviewed by me shows no leukocytosis, no anemia, no metabolic derangements.  He is hyperglycemic although this appears to be his baseline.  Serial troponins are negative.  Symptoms do not appear to be cardiac in etiology and he has been asymptomatic while in the ED.  Doubt ACS/MI, PE, dissection.  HEART score 3. Stable for discharge home with follow-up with his PCP or cardiology for reevaluation.  Discussed strict ED return precautions. Pt verbalized understanding of and agreement with plan and is safe for discharge home at this time.   Final Clinical Impressions(s) / ED Diagnoses   Final diagnoses:  Atypical chest pain    ED Discharge Orders    None       Jeanie Sewer, PA-C 11/25/17 0981    Bethann Berkshire, MD 11/25/17 [phone]

## 2017-11-27 ENCOUNTER — Other Ambulatory Visit: Payer: Self-pay | Admitting: Internal Medicine

## 2017-11-28 ENCOUNTER — Other Ambulatory Visit: Payer: Self-pay | Admitting: Physician Assistant

## 2017-11-28 MED ORDER — DOXAZOSIN MESYLATE 1 MG PO TABS: 1.0000 mg | ORAL_TABLET | Freq: Every day | ORAL | 1 refills | Status: DC

## 2017-11-30 ENCOUNTER — Ambulatory Visit (INDEPENDENT_AMBULATORY_CARE_PROVIDER_SITE_OTHER): Payer: BC Managed Care – PPO | Admitting: *Deleted

## 2017-11-30 DIAGNOSIS — E291 Testicular hypofunction: Secondary | ICD-10-CM | POA: Diagnosis not present

## 2017-11-30 DIAGNOSIS — E349 Endocrine disorder, unspecified: Secondary | ICD-10-CM | POA: Diagnosis not present

## 2017-11-30 NOTE — Progress Notes (Signed)
Patient is here for a NV for his Testosterone 200 mg/ml 2 ml IM injection in his left upper outer quadrant. Patient tolerated well.

## 2017-12-06 NOTE — Progress Notes (Deleted)
Complete Physical  Assessment and Plan: Male hypogonadism He would like to try to switch to testim, printed out RX for him - testosterone cypionate (DEPOTESTOTERONE CYPIONATE) injection 400 mg; Inject 2 mLs (400 mg total) into the muscle once. - Testosterone   Essential hypertension - continue medications, DASH diet, exercise and monitor at home. Call if greater than 130/80.  - CBC with Differential/Platelet - Hepatic function panel - TSH - Urinalysis, Routine w reflex microscopic - Microalbumin / creatinine urine ratio - EKG 12-Lead - will switch from ziac to doxazosin   Hyperlipidemia -continue medications, check lipids, decrease fatty foods, increase activity.  Not at goal, goal is LDL less than 70 - Lipid panel  CKD stage 2 due to type 2 diabetes mellitus Discussed general issues about diabetes pathophysiology and management., Educational material distributed., Suggested low cholesterol diet., Encouraged aerobic exercise., Discussed foot care., Reminded to get yearly retinal exam. - BASIC METABOLIC PANEL WITH GFR - Hemoglobin A1c - Insulin, fasting - HM DIABETES FOOT EXAM  Obesity Obesity with co morbidities- long discussion about weight loss, diet, and exercise  Gastroesophageal reflux disease without esophagitis Continue PPI/H2 blocker, diet discussed  Vitamin D deficiency - Vit D  25 hydroxy (rtn osteoporosis monitoring)  Medication management - Magnesium  Prostate cancer screening - PSA  BPH - continue flomax  Discussed med's effects and SE's. Screening labs and tests as requested with regular follow-up as recommended. Moderate- high complexity decision making was done during the course of this visit.   HPI Patient presents for complete physical.   Patient's blood pressure has been controlled at home, has not taken BP at home. Patient denies chest pain, shortness of breath, dizziness.    Patient's cholesterol is diet controlled and he is on pravachol, does  have history of elevated CPK in the past, will monitor, denies myalgias. The patient's cholesterol last visit was: Lab Results  Component Value Date   CHOL 213 (H) 07/30/2017   HDL 37 (L) 07/30/2017   LDLCALC 135 (H) 07/30/2017   TRIG 269 (H) 07/30/2017   CHOLHDL 5.8 (H) 07/30/2017  .  Lab Results  Component Value Date   CKTOTAL 132 09/15/2015   He has a history of testosterone deficiency and is on testosterone replacement. He states that the testosterone helps with his energy, libido, muscle mass. Lab Results  Component Value Date   TESTOSTERONE 163 (L) 10/04/2016   He is not on vitamin D Lab Results  Component Value Date   VD25OH 20 (L) 05/01/2017   He has been working on diet and exercise for Diabetes with diabetic chronic kidney disease , has done a good job with diet to decrease his A1C, he is on metformin he is on bASA, he is not on ACE/ARB, and denies paresthesia of the feet, polydipsia, polyuria and visual disturbances. Last A1C was:  Lab Results  Component Value Date   HGBA1C 8.3 (H) 07/30/2017    Lab Results  Component Value Date   GFRNONAA >60 11/24/2017   He has frequent urination, flomax helps.  BMI is There is no height or weight on file to calculate BMI., he is working on diet and exercise. Wt Readings from Last 3 Encounters:  10/10/17 229 lb (103.9 kg)  07/30/17 230 lb 9.6 oz (104.6 kg)  05/24/17 229 lb (103.9 kg)    Current Medications:  Current Outpatient Medications on File Prior to Visit  Medication Sig Dispense Refill  . aspirin 81 MG chewable tablet Chew by mouth daily.    .Marland Kitchen  bisoprolol-hydrochlorothiazide (ZIAC) 5-6.25 MG tablet Take 1 tablet by mouth daily. 90 tablet 0  . Cholecalciferol 1000 units capsule Take 5,000 Units by mouth daily.    Marland Kitchen doxazosin (CARDURA) 1 MG tablet Take 1 tablet (1 mg total) by mouth at bedtime. 90 tablet 1  . metFORMIN (GLUCOPHAGE-XR) 500 MG 24 hr tablet TAKE TWO TABLETS BY MOUTH TWICE DAILY 360 tablet 1  . ranitidine  (ZANTAC) 300 MG tablet TAKE ONE TABLET BY MOUTH ONCE DAILY FOR  ACID  REFLUX 90 tablet 1  . rosuvastatin (CRESTOR) 10 MG tablet Start 1/2 pill at bed time, will recheck chol 3 months and may increase to 1 pill at that time. 30 tablet 3  . sulfamethoxazole-trimethoprim (BACTRIM DS,SEPTRA DS) 800-160 MG tablet Take 1 tablet by mouth 2 (two) times daily. 20 tablet 0  . testosterone cypionate (DEPOTESTOSTERONE CYPIONATE) 200 MG/ML injection INJECT TWO ML (CC) INTRAMUSCULARLY ONCE EVERY 2 WEEKS 10 mL 1  . zinc gluconate 50 MG tablet Take 50 mg by mouth daily.     Current Facility-Administered Medications on File Prior to Visit  Medication Dose Route Frequency Provider Last Rate Last Dose  . testosterone cypionate (DEPOTESTOSTERONE CYPIONATE) injection 200 mg  200 mg Intramuscular Q14 Days Quentin Mulling, PA-C   200 mg at 07/30/17 1610  . testosterone cypionate (DEPOTESTOSTERONE CYPIONATE) injection 200 mg  200 mg Intramuscular Q14 Days Judd Gaudier, NP   200 mg at 10/29/17 1019   Health Maintenance:   Immunization History  Administered Date(s) Administered  . Td 03/27/2005  . Tdap 08/09/2015   Tetanus: 2017 Pneumovax: 2013 Prevnar 13: due age 62 Flu vaccine: N/A Zostavax:N/A  DEXA: N/A Colonoscopy: 10/2009 due 10 years EGD:N/A Ct chest 2005 CXR 2007 Stress test 08/2005  Medical History:  Past Medical History:  Diagnosis Date  . GERD (gastroesophageal reflux disease)   . Hyperlipidemia   . Hypertension   . Other testicular hypofunction   . Prediabetes   . Vitamin D deficiency    Allergies Allergies  Allergen Reactions  . Codeine     REACTION: Anxiety    SURGICAL HISTORY He  has a past surgical history that includes Vasectomy (01/2004) and Urethral dilation (2007). FAMILY HISTORY His family history includes COPD in his father; Heart disease in his father; Hyperlipidemia in his mother; Hypertension in his father and mother. SOCIAL HISTORY He  reports that he has never  smoked. He has never used smokeless tobacco. He reports that he does not drink alcohol or use drugs.  Review of Systems  Constitutional: Negative.   HENT: Negative for congestion, ear discharge, ear pain, hearing loss, nosebleeds, sore throat and tinnitus.   Eyes: Negative for blurred vision, double vision, photophobia, pain, discharge and redness.  Respiratory: Negative.  Negative for stridor.   Cardiovascular: Negative.  Negative for chest pain.  Gastrointestinal: Negative.   Genitourinary: Negative for dysuria, flank pain, frequency, hematuria and urgency.  Musculoskeletal: Negative.   Skin: Negative.   Neurological: Negative.  Negative for headaches.  Psychiatric/Behavioral: Negative.  Negative for depression. The patient is not nervous/anxious.    Physical Exam: Estimated body mass index is 31.06 kg/m as calculated from the following:   Height as of 10/10/17: 6' (1.829 m).   Weight as of 10/10/17: 229 lb (103.9 kg). There were no vitals filed for this visit. General Appearance: Well nourished, in no apparent distress. Eyes: PERRLA, EOMs, conjunctiva no swelling or erythema, normal fundi and vessels. Sinuses: No Frontal/maxillary tenderness ENT/Mouth: Ext aud canals clear, normal light  reflex with TMs without erythema, bulging. Good dentition. No erythema, swelling, or exudate on post pharynx. Tonsils not swollen or erythematous. Hearing normal.  Neck: Supple, thyroid normal. No bruits Respiratory: Respiratory effort normal, BS equal bilaterally without rales, rhonchi, wheezing or stridor. Cardio: RRR without murmurs, rubs or gallops. Brisk peripheral pulses without edema.  Chest: symmetric, with normal excursions and percussion. Abdomen: Soft, +BS. Non tender, no guarding, rebound, hernias, masses, or organomegaly. .  Lymphatics: Non tender without lymphadenopathy.  Genitourinary: defer did last visit, enlarged.  Musculoskeletal: Full ROM all peripheral extremities,5/5 strength,  and normal gait. Skin: Warm, dry without rashes, lesions, ecchymosis.  Neuro: Cranial nerves intact, reflexes equal bilaterally. Normal muscle tone, no cerebellar symptoms. Sensation intact.  Psych: Awake and oriented X 3, normal affect, Insight and Judgment appropriate.   EKG: WNL no changes. Aorta scan: WNL  Quentin Mulling 1:18 PM

## 2017-12-10 ENCOUNTER — Encounter: Payer: Self-pay | Admitting: Physician Assistant

## 2017-12-18 ENCOUNTER — Ambulatory Visit: Payer: BC Managed Care – PPO | Admitting: *Deleted

## 2017-12-18 DIAGNOSIS — E349 Endocrine disorder, unspecified: Secondary | ICD-10-CM | POA: Diagnosis not present

## 2017-12-18 MED ORDER — TESTOSTERONE CYPIONATE 200 MG/ML IM SOLN
400.0000 mg | Freq: Once | INTRAMUSCULAR | Status: AC
  Administered 2017-12-18: 400 mg via INTRAMUSCULAR

## 2017-12-18 NOTE — Progress Notes (Signed)
Patient is here for his Testosterone Cypionate 200mg /ml 2 ml IM injection in the right upper outer quadrant.  Patient tolerated well and will return in 2 weeks for his next injection.

## 2018-01-18 ENCOUNTER — Encounter: Payer: Self-pay | Admitting: Adult Health

## 2018-01-18 ENCOUNTER — Ambulatory Visit (INDEPENDENT_AMBULATORY_CARE_PROVIDER_SITE_OTHER): Payer: BC Managed Care – PPO | Admitting: Adult Health

## 2018-01-18 VITALS — BP 134/84 | HR 69 | Temp 97.7°F | Ht 72.0 in | Wt 232.0 lb

## 2018-01-18 DIAGNOSIS — E349 Endocrine disorder, unspecified: Secondary | ICD-10-CM | POA: Diagnosis not present

## 2018-01-18 DIAGNOSIS — J00 Acute nasopharyngitis [common cold]: Secondary | ICD-10-CM | POA: Diagnosis not present

## 2018-01-18 MED ORDER — PREDNISONE 20 MG PO TABS: ORAL_TABLET | ORAL | 0 refills | Status: DC

## 2018-01-18 MED ORDER — PROMETHAZINE-DM 6.25-15 MG/5ML PO SYRP: 5.0000 mL | ORAL_SOLUTION | Freq: Four times a day (QID) | ORAL | 1 refills | Status: DC | PRN

## 2018-01-18 MED ORDER — TESTOSTERONE CYPIONATE 200 MG/ML IM SOLN
200.0000 mg | Freq: Once | INTRAMUSCULAR | Status: AC
  Administered 2018-01-18: 200 mg via INTRAMUSCULAR

## 2018-01-18 NOTE — Progress Notes (Signed)
Assessment and Plan:  Mark [name] was seen today for uri.  Diagnoses and all orders for this visit:  Acute nasopharyngitis Discussed the importance of avoiding unnecessary antibiotic therapy. Suggested symptomatic OTC remedies. Nasal saline spray for congestion. Nasal steroids, allergy pill, oral steroids offered Follow up as needed, call if developing a fever or suddenly getting worse Present to ED for sudden severe symptoms -     predniSONE (DELTASONE) 20 MG tablet; 2 tablets daily for 3 days, 1 tablet daily for 4 days. -     promethazine-dextromethorphan (PROMETHAZINE-DM) 6.25-15 MG/5ML syrup; Take 5 mLs by mouth 4 (four) times daily as needed for cough.  Further disposition pending results of labs. Discussed med's effects and SE's.   Over 15 minutes of exam, counseling, chart review, and critical decision making was performed.   Future Appointments  Date Time Provider Department Center  01/18/2018  9:45 AM Judd Gaudier, NP [facility] None    ------------------------------------------------------------------------------------------------------------------   HPI Mark [name] with hx of htn, T2DM, GERD presents for evaluation of sinus congestion that began suddenly 1 week ago; endorses nasal congetion, denies facial pain, headaches, ear pressure, fever/chills, nausea. He endorses sneezing, congestion settling into chest, hoarseness after coughing. Denies wheezing, dyspnea. Denies sore throat.   He has taken some nyquil at night for sleep, but otherwise hasn't taken anything.   He has no known sick contacts, doesn't get the flu vaccine.   Past Medical History:  Diagnosis Date  . GERD (gastroesophageal reflux disease)   . Hyperlipidemia   . Hypertension   . Other testicular hypofunction   . Prediabetes   . Vitamin D deficiency      Allergies  Allergen Reactions  . Codeine     REACTION: Anxiety    Current Outpatient Medications on File Prior to Visit  Medication Sig  .  aspirin 81 MG chewable tablet Chew by mouth daily.  . bisoprolol-hydrochlorothiazide (ZIAC) 5-6.25 MG tablet Take 1 tablet by mouth daily.  . Cholecalciferol 1000 units capsule Take 5,000 Units by mouth daily.  Marland Kitchen doxazosin (CARDURA) 1 MG tablet Take 1 tablet (1 mg total) by mouth at bedtime.  . metFORMIN (GLUCOPHAGE-XR) 500 MG 24 hr tablet TAKE TWO TABLETS BY MOUTH TWICE DAILY  . ranitidine (ZANTAC) 300 MG tablet TAKE ONE TABLET BY MOUTH ONCE DAILY FOR  ACID  REFLUX  . rosuvastatin (CRESTOR) 10 MG tablet Start 1/2 pill at bed time, will recheck chol 3 months and may increase to 1 pill at that time.  . sulfamethoxazole-trimethoprim (BACTRIM DS,SEPTRA DS) 800-160 MG tablet Take 1 tablet by mouth 2 (two) times daily.  Marland Kitchen testosterone cypionate (DEPOTESTOSTERONE CYPIONATE) 200 MG/ML injection INJECT TWO ML (CC) INTRAMUSCULARLY ONCE EVERY 2 WEEKS  . zinc gluconate 50 MG tablet Take 50 mg by mouth daily.   Current Facility-Administered Medications on File Prior to Visit  Medication  . testosterone cypionate (DEPOTESTOSTERONE CYPIONATE) injection 200 mg  . testosterone cypionate (DEPOTESTOSTERONE CYPIONATE) injection 200 mg    ROS: all negative except above.   Physical Exam:  BP 134/84   Pulse 69   Temp 97.7 F (36.5 C)   Ht 6' (1.829 m)   Wt 232 lb (105.2 kg)   SpO2 95%   BMI 31.46 kg/m   General Appearance: Well nourished, in no apparent distress. Eyes: PERRLA, EOMs, conjunctiva no swelling or erythema Sinuses: No Frontal/maxillary tenderness ENT/Mouth: Ext aud canals clear, TMs without erythema, bulging. No erythema, swelling, or exudate on post pharynx.  Tonsils not swollen or erythematous. Hearing  normal.  Neck: Supple, thyroid normal.  Respiratory: Respiratory effort normal, BS equal bilaterally without rales, rhonchi, wheezing or stridor.  Cardio: RRR with no MRGs. Brisk peripheral pulses without edema.  Abdomen: Soft, + BS.   Lymphatics: Non tender without lymphadenopathy.   Musculoskeletal: Symmetrical strength, normal gait.  Skin: Warm, dry without rashes, lesions, ecchymosis.  Neuro: Cranial nerves intact. Normal muscle tone. Psych: Awake and oriented X 3, normal affect, Insight and Judgment appropriate.    Dan Maker, NP 9:41 AM [facility]

## 2018-01-18 NOTE — Patient Instructions (Signed)

## 2018-01-18 NOTE — Progress Notes (Signed)
Patient is here for his Testosterone Cypionate 200mg /ml 2 ml IM injection in the left upper outer quadrant.  Patient tolerated well and will return in 2 weeks for his next injection.

## 2018-01-21 ENCOUNTER — Other Ambulatory Visit: Payer: Self-pay | Admitting: Adult Health

## 2018-01-21 ENCOUNTER — Telehealth: Payer: Self-pay

## 2018-01-21 MED ORDER — AZITHROMYCIN 250 MG PO TABS: ORAL_TABLET | ORAL | 1 refills | Status: AC

## 2018-01-21 NOTE — Telephone Encounter (Signed)
Patient notified. Is fine at this time not to have a chest xray

## 2018-01-21 NOTE — Telephone Encounter (Signed)
Patient states that he has "flu like" symptoms, isn't feeling any better. Requesting a z-pak.

## 2018-02-12 ENCOUNTER — Ambulatory Visit: Payer: BC Managed Care – PPO | Admitting: *Deleted

## 2018-02-12 VITALS — BP 124/82 | HR 60 | Temp 97.3°F | Ht 72.0 in | Wt 229.0 lb

## 2018-02-12 DIAGNOSIS — E349 Endocrine disorder, unspecified: Secondary | ICD-10-CM

## 2018-02-12 MED ORDER — TESTOSTERONE CYPIONATE 200 MG/ML IM SOLN
400.0000 mg | Freq: Once | INTRAMUSCULAR | Status: AC
  Administered 2018-02-12: 400 mg via INTRAMUSCULAR

## 2018-02-12 NOTE — Patient Instructions (Signed)
Patient is here for a NV to receive his Testosterone Cypionate 200 mg 2 ml injection in his right upper outer quadrant. Patient tolerated well and will return in 2 weeks for his next injection.

## 2018-03-11 ENCOUNTER — Other Ambulatory Visit: Payer: Self-pay | Admitting: Internal Medicine

## 2018-04-12 ENCOUNTER — Ambulatory Visit: Payer: BC Managed Care – PPO

## 2018-04-12 VITALS — BP 128/74 | HR 64 | Temp 97.4°F | Ht 72.0 in | Wt 225.8 lb

## 2018-04-12 DIAGNOSIS — E349 Endocrine disorder, unspecified: Secondary | ICD-10-CM

## 2018-04-12 NOTE — Progress Notes (Signed)
Patient is here for a NV to receive his Testosterone Cypionate 200 mg 2 ml injection in his LEFT upper outer quadrant (glute). Patient tolerated well and will return in 2 weeks for his next injection 

## 2018-05-13 ENCOUNTER — Ambulatory Visit: Payer: BC Managed Care – PPO

## 2018-05-13 DIAGNOSIS — E349 Endocrine disorder, unspecified: Secondary | ICD-10-CM

## 2018-05-13 MED ORDER — TESTOSTERONE CYPIONATE 200 MG/ML IM SOLN
200.0000 mg | Freq: Once | INTRAMUSCULAR | Status: AC
  Administered 2018-05-13: 200 mg via INTRAMUSCULAR

## 2018-05-13 NOTE — Progress Notes (Signed)
Patient is here for a NV to receive his Testosterone Cypionate 200 mg 2 ml injection in his RIGHT upper outer quadrant (glute). Patient tolerated well and will return in 2 weeks for his next injection

## 2018-06-08 ENCOUNTER — Other Ambulatory Visit: Payer: Self-pay | Admitting: Internal Medicine

## 2018-06-25 ENCOUNTER — Ambulatory Visit: Payer: BC Managed Care – PPO

## 2018-06-25 ENCOUNTER — Other Ambulatory Visit: Payer: Self-pay

## 2018-06-25 VITALS — BP 130/84 | HR 81 | Temp 97.5°F | Wt 229.0 lb

## 2018-06-25 DIAGNOSIS — E349 Endocrine disorder, unspecified: Secondary | ICD-10-CM

## 2018-06-25 MED ORDER — TESTOSTERONE CYPIONATE 200 MG/ML IM SOLN
200.0000 mg | Freq: Once | INTRAMUSCULAR | Status: AC
  Administered 2018-06-25: 200 mg via INTRAMUSCULAR

## 2018-06-25 NOTE — Progress Notes (Signed)
Patient is here for a NV to receive his Testosterone Cypionate 200 mg 2 ml injection in his leftupper outer quadrant (glute). Patient tolerated well and will return in 2 weeks for his next injection

## 2018-07-10 ENCOUNTER — Encounter: Payer: Self-pay | Admitting: Physician Assistant

## 2018-07-10 ENCOUNTER — Other Ambulatory Visit: Payer: Self-pay

## 2018-07-10 ENCOUNTER — Ambulatory Visit: Payer: BC Managed Care – PPO | Admitting: Physician Assistant

## 2018-07-10 VITALS — BP 140/83

## 2018-07-10 DIAGNOSIS — J340 Abscess, furuncle and carbuncle of nose: Secondary | ICD-10-CM

## 2018-07-10 MED ORDER — DOXYCYCLINE HYCLATE 100 MG PO CAPS: ORAL_CAPSULE | ORAL | 0 refills | Status: DC

## 2018-07-10 NOTE — Progress Notes (Signed)
THIS ENCOUNTER IS A VIRTUAL/TELEPHONE VISIT DUE TO COVID-19 - PATIENT WAS NOT SEEN IN THE OFFICE.  PATIENT HAS CONSENTED TO VIRTUAL VISIT / TELEMEDICINE VISIT  This provider placed a call to Mark [name] using telephone, his appointment was changed to a virtual office visit to reduce the risk of exposure to the COVID-19 virus and to help Mark [name] remain healthy and safe. The virtual visit will also provide continuity of care. He verbalizes understanding.    Subjective:    Patient ID: Mark [name], male    DOB: 1955/08/10, 63 y.o.   MRN: [id number]  HPI 63 y.o. WM presents with nasal infection x 1 week. States right nasal passage was swollen, red, warmth. The outside was sore and tender to touch. The redness has improved, he has tried peroxide and neosporyn. Now he has some swelling under his right eye that is tender to touch, he is having some teeth pain.  No fever, chills, no cough, SOB, CP.   Medications Current Outpatient Medications on File Prior to Visit  Medication Sig  . aspirin 81 MG chewable tablet Chew by mouth daily.  . bisoprolol-hydrochlorothiazide (ZIAC) 5-6.25 MG tablet Take 1 tablet by mouth daily.  . Cholecalciferol 1000 units capsule Take 5,000 Units by mouth daily.  Marland Kitchen doxazosin (CARDURA) 1 MG tablet Take 1 tablet (1 mg total) by mouth at bedtime.  . metFORMIN (GLUCOPHAGE-XR) 500 MG 24 hr tablet TAKE TWO TABLETS BY MOUTH TWICE DAILY  . ranitidine (ZANTAC) 300 MG tablet TAKE ONE TABLET BY MOUTH ONCE DAILY FOR  ACID  REFLUX  . rosuvastatin (CRESTOR) 10 MG tablet Start 1/2 pill at bed time, will recheck chol 3 months and may increase to 1 pill at that time.  . sulfamethoxazole-trimethoprim (BACTRIM DS,SEPTRA DS) 800-160 MG tablet Take 1 tablet by mouth 2 (two) times daily.  . tamsulosin (FLOMAX) 0.4 MG CAPS capsule TAKE 1 CAPSULE BY MOUTH ONCE DAILY  . testosterone cypionate (DEPOTESTOSTERONE CYPIONATE) 200 MG/ML injection INJECT TWO ML (CC) INTRAMUSCULARLY ONCE  EVERY 2 WEEKS  . zinc gluconate 50 MG tablet Take 50 mg by mouth daily.   Current Facility-Administered Medications on File Prior to Visit  Medication  . testosterone cypionate (DEPOTESTOSTERONE CYPIONATE) injection 200 mg  . testosterone cypionate (DEPOTESTOSTERONE CYPIONATE) injection 200 mg    Problem list He has Hyperlipidemia; Hypertension; Type 2 diabetes mellitus (HCC); GERD (gastroesophageal reflux disease); Vitamin D deficiency; Testosterone deficiency; Obesity (BMI 30.0-34.9); Medication management; Benign prostatic hyperplasia with urinary frequency; CKD stage 2 due to type 2 diabetes mellitus (HCC); and Snoring on their problem list.  Allergies  Allergen Reactions  . Codeine     REACTION: Anxiety     Review of Systems See HPI    Objective:   Physical Exam General Appearance:Well sounding, in no apparent distress.  ENT/Mouth: No hoarseness, No cough for duration of visit.  Respiratory: completing full sentences without distress, without audible wheeze Neuro: Awake and oriented X 3,  Psych:  Insight and Judgment appropriate.      Assessment & Plan:    Nasal abscess      doxycycline (VIBRAMYCIN) 100 MG capsule; Take 1 capsule twice daily with food Can take aleve/iburpofen for pain Heating pad   Between 15-20 minutes of counseling, chart review, and critical decision making was performed  The patient was advised to call immediately if he has any concerning symptoms in the interval. The patient voices understanding of current treatment options and is in agreement with the current care  plan.The patient knows to call the clinic with any problems, questions or concerns or go to the ER if any further progression of symptoms.

## 2018-08-12 ENCOUNTER — Ambulatory Visit (INDEPENDENT_AMBULATORY_CARE_PROVIDER_SITE_OTHER): Payer: BC Managed Care – PPO

## 2018-08-12 ENCOUNTER — Other Ambulatory Visit: Payer: Self-pay

## 2018-08-12 VITALS — BP 132/84 | HR 88 | Temp 98.1°F | Wt 227.0 lb

## 2018-08-12 DIAGNOSIS — E349 Endocrine disorder, unspecified: Secondary | ICD-10-CM

## 2018-08-12 MED ORDER — TESTOSTERONE CYPIONATE 200 MG/ML IM SOLN
200.0000 mg | Freq: Once | INTRAMUSCULAR | Status: AC
  Administered 2018-08-12: 200 mg via INTRAMUSCULAR

## 2018-08-12 NOTE — Progress Notes (Signed)
Patient is here for a NV to receive his Testosterone Cypionate 200 mg 2 ml injection in his RIGHT upper outer quadrant (glute). Patient tolerated well and will return in 2 weeks for his next injection

## 2018-08-12 NOTE — Telephone Encounter (Signed)
Testosterone refill request.  °

## 2018-08-14 ENCOUNTER — Ambulatory Visit: Payer: BC Managed Care – PPO

## 2018-08-16 MED ORDER — TESTOSTERONE CYPIONATE 200 MG/ML IM SOLN: INTRAMUSCULAR | 0 refills | Status: DC

## 2018-08-23 NOTE — Progress Notes (Signed)
Complete Physical  Assessment and Plan: Encounter for general adult medical examination with abnormal findings 1 year  Essential hypertension -     CBC with Differential/Platelet -     COMPLETE METABOLIC PANEL WITH GFR -     TSH -     Urinalysis, Routine w reflex microscopic -     Microalbumin / creatinine urine ratio -     EKG 12-Lead -    - continue medications, DASH diet, exercise and monitor at home. Call if greater than 130/80.   Type 2 diabetes mellitus with stage 2 chronic kidney disease, without long-term current use of insulin (HCC) -     Hemoglobin A1c -     Insulin, random Discussed general issues about diabetes pathophysiology and management., Educational material distributed., Suggested low cholesterol diet., Encouraged aerobic exercise., Discussed foot care., Reminded to get yearly retinal exam. GET EYE EXAM Off metformin, he will likely need  CKD stage 2 due to type 2 diabetes mellitus (HCC) -     Hemoglobin A1c -     Insulin, random Discussed general issues about diabetes pathophysiology and management., Educational material distributed., Suggested low cholesterol diet., Encouraged aerobic exercise., Discussed foot care., Reminded to get yearly retinal exam. - not at goal, stopped BB, may add on ACE  Mixed hyperlipidemia -     Lipid panel check lipids decrease fatty foods increase activity.  - willing to start pravastatin or lipitor, states one did well previously, would try pravastatin likely  Testosterone deficiency -     Testosterone - done today, monitor -     Dihydrotestosterone -     testosterone cypionate (DEPOTESTOSTERONE CYPIONATE) injection 200 mg  Obesity (BMI 30.0-34.9) - follow up 3 months for progress monitoring - increase veggies, decrease carbs - long discussion about weight loss, diet, and exercise  Medication management -     Magnesium -     testosterone cypionate (DEPOTESTOSTERONE CYPIONATE) injection 200 mg  Benign prostatic  hyperplasia with urinary frequency -     PSA -     Dihydrotestosterone -     tamsulosin (FLOMAX) 0.4 MG CAPS capsule; Take 1 capsule (0.4 mg total) by mouth daily.  Snoring Monitor  Vitamin D deficiency -     VITAMIN D 25 Hydroxy (Vit-D Deficiency, Fractures)  Gastroesophageal reflux disease, esophagitis presence not specified Continue PPI/H2 blocker, diet discussed  Screening, anemia, deficiency, iron -     Iron,Total/Total Iron Binding Cap -     Ferritin -     Vitamin B12  Encounter for screening for HIV -     HIV Antibody (routine testing w rflx)  Encounter for hepatitis C screening test for low risk patient -     Hepatitis C antibody    Discussed med's effects and SE's. Screening labs and tests as requested with regular follow-up as recommended. Moderate- high complexity decision making was done during the course of this visit.  Future Appointments  Date Time Provider Department Center  09/01/2019 10:00 AM Judd Gaudierorbett, Ashley, NP [facility] None    HPI Patient presents for complete physical.   Patient's blood pressure has been controlled at home, has not taken BP at home. Patient denies chest pain, shortness of breath, dizziness. BP: 122/80  He has frequent urination, he has flomax and cardura at night and states it helps with his BP, he is not on ziac.  BMI is Body mass index is 31.11 kg/m., he is working on diet and exercise. Wt Readings from Last 3 Encounters:  08/27/18 232 lb 9.6 oz (105.5 kg)  08/12/18 227 lb (103 kg)  06/25/18 229 lb (103.9 kg)   Patient's cholesterol is diet controlled and he is on crestor, does have history of elevated CPK in the past, will monitor, denies myalgias. The patient's cholesterol last visit was: Lab Results  Component Value Date   CHOL 213 (H) 07/30/2017   HDL 37 (L) 07/30/2017   LDLCALC 135 (H) 07/30/2017   TRIG 269 (H) 07/30/2017   CHOLHDL 5.8 (H) 07/30/2017  .  Lab Results  Component Value Date   CKTOTAL 132 09/15/2015    He is not on vitamin D Lab Results  Component Value Date   VD25OH 20 (L) 05/01/2017   He has been working on diet and exercise for Diabetes  CKD not on ACE Hyperlipidemia on crestor HAS NOT BEEN ON IT, has history of elevated CPK in the past Stress test 2007 normal Eye exam NEVER  he is on bASA On metformin HAS NOT TAKEN IT HE DOES NOT CHECK HIS SUGARS AT HOME and denies paresthesia of the feet, polydipsia, polyuria and visual disturbances.  Last A1C was:  Lab Results  Component Value Date   HGBA1C 8.3 (H) 07/30/2017    Lab Results  Component Value Date   GFRNONAA >60 11/24/2017   He has a history of testosterone deficiency and is on testosterone replacement. He states that the testosterone helps with his energy, libido, muscle mass. He is due today.  Lab Results  Component Value Date   TESTOSTERONE 163 (L) 10/04/2016    Current Medications:  Current Outpatient Medications on File Prior to Visit  Medication Sig Dispense Refill  . aspirin 81 MG chewable tablet Chew by mouth daily.    . Cholecalciferol 1000 units capsule Take 5,000 Units by mouth daily.    Marland Kitchen testosterone cypionate (DEPOTESTOSTERONE CYPIONATE) 200 MG/ML injection INJECT TWO ML (CC) INTRAMUSCULARLY ONCE EVERY 2 WEEKS 10 mL 0  . zinc gluconate 50 MG tablet Take 50 mg by mouth daily.     No current facility-administered medications on file prior to visit.    Health Maintenance:   Immunization History  Administered Date(s) Administered  . Td 03/27/2005  . Tdap 08/09/2015   Tetanus: 2017 Pneumovax: 2013 Prevnar 13: due age 38 Flu vaccine: N/A Zostavax:N/A  DEXA: N/A Colonoscopy: 10/2009 due 10 years EGD:N/A Ct chest 2005 CXR 10/2017 Stress test 08/2005  Medical History:  Past Medical History:  Diagnosis Date  . GERD (gastroesophageal reflux disease)   . Hyperlipidemia   . Hypertension   . Other testicular hypofunction   . Prediabetes   . Vitamin D deficiency    Allergies Allergies   Allergen Reactions  . Codeine     REACTION: Anxiety    SURGICAL HISTORY He  has a past surgical history that includes Vasectomy (01/2004) and Urethral dilation (2007). FAMILY HISTORY His family history includes COPD in his father; Heart disease in his father; Hyperlipidemia in his mother; Hypertension in his father and mother. SOCIAL HISTORY He  reports that he has never smoked. He has never used smokeless tobacco. He reports that he does not drink alcohol or use drugs.  Review of Systems  Constitutional: Negative.   HENT: Negative for congestion, ear discharge, ear pain, hearing loss, nosebleeds, sore throat and tinnitus.   Eyes: Negative for blurred vision, double vision, photophobia, pain, discharge and redness.  Respiratory: Negative.  Negative for stridor.   Cardiovascular: Negative.  Negative for chest pain.  Gastrointestinal: Negative.  Genitourinary: Negative for dysuria, flank pain, frequency, hematuria and urgency.  Musculoskeletal: Negative.   Skin: Negative.   Neurological: Negative.  Negative for headaches.  Psychiatric/Behavioral: Negative.  Negative for depression. The patient is not nervous/anxious.    Physical Exam: Estimated body mass index is 31.11 kg/m as calculated from the following:   Height as of this encounter: 6' 0.5" (1.842 m).   Weight as of this encounter: 232 lb 9.6 oz (105.5 kg). Vitals:   08/27/18 1000  BP: 122/80  Pulse: 92  Temp: 97.9 F (36.6 C)  SpO2: 95%   General Appearance: Well nourished, in no apparent distress. Eyes: PERRLA, EOMs, conjunctiva no swelling or erythema, normal fundi and vessels. Sinuses: No Frontal/maxillary tenderness ENT/Mouth: Ext aud canals clear, normal light reflex with TMs without erythema, bulging. Good dentition. No erythema, swelling, or exudate on post pharynx. Tonsils not swollen or erythematous. Hearing normal.  Neck: Supple, thyroid normal. No bruits Respiratory: Respiratory effort normal, BS equal  bilaterally without rales, rhonchi, wheezing or stridor. Cardio: RRR without murmurs, rubs or gallops. Brisk peripheral pulses without edema.  Chest: symmetric, with normal excursions and percussion. Abdomen: Soft, +BS. Non tender, no guarding, rebound, hernias, masses, or organomegaly. .  Lymphatics: Non tender without lymphadenopathy.  Genitourinary: defer did last visit, enlarged.  Musculoskeletal: Full ROM all peripheral extremities,5/5 strength, and normal gait. Skin: Warm, dry without rashes, lesions, ecchymosis.  Neuro: Cranial nerves intact, reflexes equal bilaterally. Normal muscle tone, no cerebellar symptoms. Sensation intact.  Psych: Awake and oriented X 3, normal affect, Insight and Judgment appropriate.   EKG: WNL no changes. Aorta scan: DEFER  Quentin Mulling 12:15 PM

## 2018-08-27 ENCOUNTER — Other Ambulatory Visit: Payer: Self-pay

## 2018-08-27 ENCOUNTER — Encounter: Payer: Self-pay | Admitting: Physician Assistant

## 2018-08-27 ENCOUNTER — Ambulatory Visit (INDEPENDENT_AMBULATORY_CARE_PROVIDER_SITE_OTHER): Payer: BC Managed Care – PPO | Admitting: Physician Assistant

## 2018-08-27 ENCOUNTER — Encounter: Payer: BC Managed Care – PPO | Admitting: Adult Health

## 2018-08-27 VITALS — BP 122/80 | HR 92 | Temp 97.9°F | Ht 72.5 in | Wt 232.6 lb

## 2018-08-27 DIAGNOSIS — Z1389 Encounter for screening for other disorder: Secondary | ICD-10-CM | POA: Diagnosis not present

## 2018-08-27 DIAGNOSIS — Z131 Encounter for screening for diabetes mellitus: Secondary | ICD-10-CM | POA: Diagnosis not present

## 2018-08-27 DIAGNOSIS — Z125 Encounter for screening for malignant neoplasm of prostate: Secondary | ICD-10-CM | POA: Diagnosis not present

## 2018-08-27 DIAGNOSIS — Z0000 Encounter for general adult medical examination without abnormal findings: Secondary | ICD-10-CM

## 2018-08-27 DIAGNOSIS — R0683 Snoring: Secondary | ICD-10-CM

## 2018-08-27 DIAGNOSIS — E782 Mixed hyperlipidemia: Secondary | ICD-10-CM

## 2018-08-27 DIAGNOSIS — K219 Gastro-esophageal reflux disease without esophagitis: Secondary | ICD-10-CM

## 2018-08-27 DIAGNOSIS — Z13220 Encounter for screening for lipoid disorders: Secondary | ICD-10-CM | POA: Diagnosis not present

## 2018-08-27 DIAGNOSIS — Z130 Encounter for screening for diseases of the blood and blood-forming organs and certain disorders involving the immune mechanism: Secondary | ICD-10-CM

## 2018-08-27 DIAGNOSIS — E349 Endocrine disorder, unspecified: Secondary | ICD-10-CM

## 2018-08-27 DIAGNOSIS — N182 Chronic kidney disease, stage 2 (mild): Secondary | ICD-10-CM

## 2018-08-27 DIAGNOSIS — Z136 Encounter for screening for cardiovascular disorders: Secondary | ICD-10-CM

## 2018-08-27 DIAGNOSIS — E1122 Type 2 diabetes mellitus with diabetic chronic kidney disease: Secondary | ICD-10-CM

## 2018-08-27 DIAGNOSIS — N401 Enlarged prostate with lower urinary tract symptoms: Secondary | ICD-10-CM | POA: Diagnosis not present

## 2018-08-27 DIAGNOSIS — Z79899 Other long term (current) drug therapy: Secondary | ICD-10-CM

## 2018-08-27 DIAGNOSIS — I10 Essential (primary) hypertension: Secondary | ICD-10-CM | POA: Diagnosis not present

## 2018-08-27 DIAGNOSIS — E559 Vitamin D deficiency, unspecified: Secondary | ICD-10-CM

## 2018-08-27 DIAGNOSIS — Z1329 Encounter for screening for other suspected endocrine disorder: Secondary | ICD-10-CM | POA: Diagnosis not present

## 2018-08-27 DIAGNOSIS — E669 Obesity, unspecified: Secondary | ICD-10-CM

## 2018-08-27 DIAGNOSIS — Z114 Encounter for screening for human immunodeficiency virus [HIV]: Secondary | ICD-10-CM

## 2018-08-27 DIAGNOSIS — Z0001 Encounter for general adult medical examination with abnormal findings: Secondary | ICD-10-CM

## 2018-08-27 DIAGNOSIS — R350 Frequency of micturition: Secondary | ICD-10-CM | POA: Diagnosis not present

## 2018-08-27 DIAGNOSIS — Z1159 Encounter for screening for other viral diseases: Secondary | ICD-10-CM

## 2018-08-27 MED ORDER — TESTOSTERONE CYPIONATE 200 MG/ML IM SOLN
200.0000 mg | INTRAMUSCULAR | Status: DC
  Administered 2018-08-27: 200 mg via INTRAMUSCULAR

## 2018-08-27 MED ORDER — TAMSULOSIN HCL 0.4 MG PO CAPS: 0.4000 mg | ORAL_CAPSULE | Freq: Every day | ORAL | 1 refills | Status: DC

## 2018-08-27 NOTE — Progress Notes (Signed)
PATIENT presents for an office visit & at that time the patient was given his (TESTOSTERONE CYPIONATE).  GIVEN LEFT VENTROGLUTEAL w/o issues.

## 2018-08-27 NOTE — Patient Instructions (Addendum)
Eye doctors that you can call, they are all very close to our office  Dr. Emily Filbert [phone] Dr. Hazle Quant [phone] Dr. Elmer Picker [phone]  GET AN EYE DOCTOR  Check out  Mini habits for weight loss book  2 apps for tracking food is myfitness pal  loseit OR can take picture of your food  DO A FOOD LOG   Diabetes or even increased sugars put you at 300% increased risk of heart attack and stroke.  ALSO BEING DIABETIC YOU MAY NOT HAVE ANY PAIN WITH A HEART ATTACK.  Even worse of a chance of no pain if you are a woman.  It is very unlikely that you will have any pain with a heart attack. Likely your symptoms will be very subtle, even for very severe disease.  Your symptoms for a heart attack will likely occur when you exert your self or exercise and include: Shortness of breath Sweating Nausea Dizziness Fast or irregular heart beats Fatigue   It makes me feel better if my diabetics get their heart rate up with exercise once or twice a week and pay close attention to your body. If there is ANY change in your exercise capacity or if you have symptoms above, please STOP and call 911 or call to come to the office.   PLEASE REMEMBER:  Diabetes is preventable! Up to 85 percent of complications and morbidities among individuals with type 2 diabetes can be prevented, delayed, or effectively treated and minimized with regular visits to a health professional, appropriate monitoring and medication, and a healthy diet and lifestyle.   Here is some information to help you keep your heart healthy: Move it! - Aim for 30 mins of activity every day. Take it slowly at first. Talk to [place] before starting any new exercise program.   Lose it.  -Body Mass Index (BMI) can indicate if you need to lose weight. A healthy range is 18.5-24.9. For a BMI calculator, go to [employer]  Waist Management -Excess abdominal fat is a risk factor for heart disease, diabetes, asthma, stroke and more. Ideal waist  circumference is less than 35" for women and less than 40" for men.   Eat Right -focus on fruits, vegetables, whole grains, and meals you make yourself. Avoid foods with trans fat and high sugar/sodium content.   Snooze or Snore? - Loud snoring can be a sign of sleep apnea, a significant risk factor for high blood pressure, heart attach, stroke, and heart arrhythmias.  Kick the habit -Quit Smoking! Avoid second hand smoke. A single cigarette raises your blood pressure for 20 mins and increases the risk of heart attack and stroke for the next 24 hours.   Are Aspirin and Supplements right for you? -Add ENTERIC COATED low dose 81 mg Aspirin daily OR can do every other day if you have easy bruising to protect your heart and head. As well as to reduce risk of Colon Cancer by 20 %, Skin Cancer by 26 % , Melanoma by 46% and Pancreatic cancer by 60%  Say "No to Stress -There may be little you can do about problems that cause stress. However, techniques such as long walks, meditation, and exercise can help you manage it.   Start Now! - Make changes one at a time and set reasonable goals to increase your likelihood of success.       When it comes to diets, agreement about the perfect plan isn't easy to find, even among the  experts. Experts at the [employer] developed an idea known as the Healthy Eating Plate. Just imagine a plate divided into logical, healthy portions.  The emphasis is on diet quality:  Load up on vegetables and fruits - one-half of your plate: Aim for color and variety, and remember that potatoes don't count.  Go for whole grains - one-quarter of your plate: Whole wheat, barley, wheat berries, quinoa, oats, brown rice, and foods made with them. If you want pasta, go with whole wheat pasta.  Protein power - one-quarter of your plate: Fish, chicken, beans, and nuts are all healthy, versatile protein sources. Limit red meat.  The diet, however, does go  beyond the plate, offering a few other suggestions.  Use healthy plant oils, such as olive, canola, soy, corn, sunflower and peanut. Check the labels, and avoid partially hydrogenated oil, which have unhealthy trans fats.  If you're thirsty, drink water. Coffee and tea are good in moderation, but skip sugary drinks and limit milk and dairy products to one or two daily servings.  The type of carbohydrate in the diet is more important than the amount. Some sources of carbohydrates, such as vegetables, fruits, whole grains, and beans-are healthier than others.  Finally, stay active.

## 2018-08-28 LAB — URINALYSIS, ROUTINE W REFLEX MICROSCOPIC
Bacteria, UA: NONE SEEN /HPF
Bilirubin Urine: NEGATIVE
Hgb urine dipstick: NEGATIVE
Hyaline Cast: NONE SEEN /LPF
Ketones, ur: NEGATIVE
Nitrite: NEGATIVE
Protein, ur: NEGATIVE
RBC / HPF: NONE SEEN /HPF (ref 0–2)
Specific Gravity, Urine: 1.034 (ref 1.001–1.03)
Squamous Epithelial / LPF: NONE SEEN /HPF (ref <=5)
WBC, UA: >=60 /HPF — AB (ref 0–5)
pH: 5.5 (ref 5.0–8.0)

## 2018-08-28 LAB — HEMOGLOBIN A1C
Hgb A1c MFr Bld: 8.8 % of total Hgb — ABNORMAL HIGH (ref <5.7)
Mean Plasma Glucose: 206 (calc)
eAG (mmol/L): 11.4 (calc)

## 2018-08-28 LAB — HEPATITIS C ANTIBODY
Hepatitis C Ab: NONREACTIVE
SIGNAL TO CUT-OFF: 0.08 (ref <1.00)

## 2018-08-28 LAB — MAGNESIUM: Magnesium: 2.1 mg/dL (ref 1.5–2.5)

## 2018-08-28 LAB — COMPLETE METABOLIC PANEL WITH GFR
AG Ratio: 1.7 (calc) (ref 1.0–2.5)
ALT: 18 U/L (ref 9–46)
AST: 14 U/L (ref 10–35)
Albumin: 4.3 g/dL (ref 3.6–5.1)
Alkaline phosphatase (APISO): 95 U/L (ref 35–144)
BUN: 13 mg/dL (ref 7–25)
CO2: 29 mmol/L (ref 20–32)
Calcium: 9.1 mg/dL (ref 8.6–10.3)
Chloride: 101 mmol/L (ref 98–110)
Creat: 1.09 mg/dL (ref 0.70–1.25)
GFR, Est African American: 83 mL/min/{1.73_m2} (ref >=60)
GFR, Est Non African American: 72 mL/min/{1.73_m2} (ref >=60)
Globulin: 2.5 g/dL (calc) (ref 1.9–3.7)
Glucose, Bld: 303 mg/dL — ABNORMAL HIGH (ref 65–99)
Potassium: 4.6 mmol/L (ref 3.5–5.3)
Sodium: 136 mmol/L (ref 135–146)
Total Bilirubin: 0.4 mg/dL (ref 0.2–1.2)
Total Protein: 6.8 g/dL (ref 6.1–8.1)

## 2018-08-28 LAB — LIPID PANEL
Cholesterol: 196 mg/dL (ref <200)
HDL: 36 mg/dL — ABNORMAL LOW (ref >=40)
LDL Cholesterol (Calc): 122 mg/dL (calc) — ABNORMAL HIGH
Non-HDL Cholesterol (Calc): 160 mg/dL (calc) — ABNORMAL HIGH (ref <130)
Total CHOL/HDL Ratio: 5.4 (calc) — ABNORMAL HIGH (ref <5.0)
Triglycerides: 235 mg/dL — ABNORMAL HIGH (ref <150)

## 2018-08-28 LAB — MICROALBUMIN / CREATININE URINE RATIO
Creatinine, Urine: 58 mg/dL (ref 20–320)
Microalb Creat Ratio: 17 mcg/mg creat (ref <30)
Microalb, Ur: 1 mg/dL

## 2018-08-28 LAB — CBC WITH DIFFERENTIAL/PLATELET
Absolute Monocytes: 680 cells/uL (ref 200–950)
Basophils Absolute: 48 cells/uL (ref 0–200)
Basophils Relative: 0.6 %
Eosinophils Absolute: 248 cells/uL (ref 15–500)
Eosinophils Relative: 3.1 %
HCT: 45.3 % (ref 38.5–50.0)
Hemoglobin: 14.3 g/dL (ref 13.2–17.1)
Lymphs Abs: 1728 cells/uL (ref 850–3900)
MCH: 28 pg (ref 27.0–33.0)
MCHC: 31.6 g/dL — ABNORMAL LOW (ref 32.0–36.0)
MCV: 88.6 fL (ref 80.0–100.0)
MPV: 10.8 fL (ref 7.5–12.5)
Monocytes Relative: 8.5 %
Neutro Abs: 5296 cells/uL (ref 1500–7800)
Neutrophils Relative %: 66.2 %
Platelets: 222 10*3/uL (ref 140–400)
RBC: 5.11 10*6/uL (ref 4.20–5.80)
RDW: 12.2 % (ref 11.0–15.0)
Total Lymphocyte: 21.6 %
WBC: 8 10*3/uL (ref 3.8–10.8)

## 2018-08-28 LAB — PSA: PSA: 2.3 ng/mL (ref <=4.0)

## 2018-08-28 LAB — TSH: TSH: 1.52 mIU/L (ref 0.40–4.50)

## 2018-08-28 LAB — INSULIN, RANDOM: Insulin: 16.7 u[IU]/mL

## 2018-08-28 LAB — IRON, TOTAL/TOTAL IRON BINDING CAP
%SAT: 23 % (calc) (ref 20–48)
Iron: 61 ug/dL (ref 50–180)
TIBC: 268 mcg/dL (calc) (ref 250–425)

## 2018-08-28 LAB — HIV ANTIBODY (ROUTINE TESTING W REFLEX): HIV 1&2 Ab, 4th Generation: NONREACTIVE

## 2018-08-28 LAB — FERRITIN: Ferritin: 318 ng/mL (ref 24–380)

## 2018-08-28 LAB — TESTOSTERONE: Testosterone: 419 ng/dL (ref 250–827)

## 2018-08-28 LAB — VITAMIN D 25 HYDROXY (VIT D DEFICIENCY, FRACTURES): Vit D, 25-Hydroxy: 19 ng/mL — ABNORMAL LOW (ref 30–100)

## 2018-08-28 LAB — VITAMIN B12: Vitamin B-12: 309 pg/mL (ref 200–1100)

## 2018-08-28 MED ORDER — PRAVASTATIN SODIUM 40 MG PO TABS: ORAL_TABLET | ORAL | 3 refills | Status: DC

## 2018-08-28 NOTE — Addendum Note (Signed)
Addended by: Doree Albee on: 08/28/2018 08:34 AM   Modules accepted: Orders

## 2018-08-31 LAB — DIHYDROTESTOSTERONE: Dihydrotestosterone LC/MS/MS: 16 ng/dL (ref 12–65)

## 2018-09-11 ENCOUNTER — Telehealth: Payer: Self-pay

## 2018-09-11 ENCOUNTER — Other Ambulatory Visit: Payer: Self-pay | Admitting: Adult Health Nurse Practitioner

## 2018-09-11 DIAGNOSIS — R350 Frequency of micturition: Secondary | ICD-10-CM

## 2018-09-11 DIAGNOSIS — N401 Enlarged prostate with lower urinary tract symptoms: Secondary | ICD-10-CM

## 2018-09-11 NOTE — Progress Notes (Signed)
Patient reports that he is having increased blood pressures at home 150's over 90's consistently since changing from Doxazosin to Flomax.  He fees like the Flomax works better.  He wants a medication sent in for his blood pressure.  He does have a blood pressure cuff at home.  Discussed sending in Losartan 50mg .  Patient reports that he dose not want to take 50mg  of any pill that is too high.  Examplained the dosing of the medication and that 0.4mg  of Flomax is NOT equvilant to 50mg  of Losartan.  He is refusing to take the medicine and wants to discuss this with Estill Bamberg.  Informed patient to call main number to make an appointment with her to discuss this further.  He is resistant to do so. Encouraged this as the best way to directly communicate with her.  Garnet Sierras, NP [facility] 09/11/2018  3:15 PM

## 2018-09-11 NOTE — Telephone Encounter (Signed)
Patient was transferred to provider.

## 2018-09-11 NOTE — Telephone Encounter (Signed)
-----   Message from Vicie Mutters, [place] sent at 09/10/2018  8:33 AM EDT ----- Regarding: RE: blood pressure medication Contact: [phone] You were correct, his BP was good at the visit. We added on the cholesterol medication for him to try since he could not tolerate the crestor. This is the medication I think he tolerated in the past for cholesterol.  Mark [name] ----- Message ----- From: Elenor Quinones, CMA Sent: 09/09/2018   2:54 PM EDT To: Vicie Mutters, PA-C Subject: blood pressure medication                      Patient states that during his visit with you that you'll talked about added on a blood pressure medication. I saw were you added on PRAVASTATIN low dose & I informed him of that but he stated that was not it.  Please advise. Thank you!

## 2018-10-04 ENCOUNTER — Telehealth: Payer: Self-pay | Admitting: Physician Assistant

## 2018-10-04 DIAGNOSIS — E349 Endocrine disorder, unspecified: Secondary | ICD-10-CM

## 2018-10-04 MED ORDER — TESTOSTERONE CYPIONATE 200 MG/ML IM SOLN: INTRAMUSCULAR | 0 refills | Status: DC

## 2018-10-04 NOTE — Telephone Encounter (Signed)
-----   Message from Elenor Quinones, [place] sent at 10/03/2018  4:53 PM EDT ----- Regarding: refill Per pt refill on TESTOSTERONE  Please & Thank you!   Fyi: Pharmacy:   [email or web address]

## 2018-10-07 ENCOUNTER — Other Ambulatory Visit: Payer: Self-pay

## 2018-10-07 ENCOUNTER — Ambulatory Visit (INDEPENDENT_AMBULATORY_CARE_PROVIDER_SITE_OTHER): Payer: BC Managed Care – PPO

## 2018-10-07 VITALS — BP 120/76 | HR 89 | Temp 97.5°F | Wt 229.8 lb

## 2018-10-07 DIAGNOSIS — E349 Endocrine disorder, unspecified: Secondary | ICD-10-CM | POA: Diagnosis not present

## 2018-10-07 MED ORDER — TESTOSTERONE CYPIONATE 200 MG/ML IM SOLN
200.0000 mg | Freq: Once | INTRAMUSCULAR | Status: AC
  Administered 2018-10-07: 200 mg via INTRAMUSCULAR

## 2018-10-07 NOTE — Progress Notes (Signed)
Patient is here for a NV to receive his Testosterone Cypionate 200 mg 2 ml injection in his LEFT upper outer quadrant (glute). Patient tolerated well and will return in 2 weeks for his next injection

## 2018-10-24 ENCOUNTER — Ambulatory Visit: Payer: BC Managed Care – PPO

## 2018-10-24 ENCOUNTER — Other Ambulatory Visit: Payer: Self-pay

## 2018-10-24 DIAGNOSIS — Z79899 Other long term (current) drug therapy: Secondary | ICD-10-CM

## 2018-10-24 DIAGNOSIS — E349 Endocrine disorder, unspecified: Secondary | ICD-10-CM

## 2018-10-24 MED ORDER — TESTOSTERONE CYPIONATE 200 MG/ML IM SOLN
200.0000 mg | INTRAMUSCULAR | Status: DC
  Administered 2018-10-24: 10:00:00 200 mg via INTRAMUSCULAR

## 2018-10-24 NOTE — Progress Notes (Signed)
Patient REPORTS for Testosterone Cypionate 200 mg 2 ml injection in hisLEFTupper outer quadrant (glute). Patient tolerated well and will return in 2 weeks for his next injection. Vitals were entered into EPIC.

## 2018-11-18 ENCOUNTER — Ambulatory Visit (INDEPENDENT_AMBULATORY_CARE_PROVIDER_SITE_OTHER): Payer: BC Managed Care – PPO | Admitting: *Deleted

## 2018-11-18 ENCOUNTER — Other Ambulatory Visit: Payer: Self-pay

## 2018-11-18 VITALS — BP 136/76 | HR 64 | Temp 97.5°F | Resp 16 | Ht 72.5 in | Wt 232.4 lb

## 2018-11-18 DIAGNOSIS — E349 Endocrine disorder, unspecified: Secondary | ICD-10-CM

## 2018-11-18 MED ORDER — TESTOSTERONE CYPIONATE 200 MG/ML IM SOLN
400.0000 mg | Freq: Once | INTRAMUSCULAR | Status: AC
  Administered 2018-11-18: 400 mg via INTRAMUSCULAR

## 2018-11-18 NOTE — Progress Notes (Signed)
Patient is here for a NV for his Testosterone Cypionate 200 mg/ml injection 2 ml IM right upper outer quadrant. Patient tolerated well and will return in 2 weeks for his next injection.

## 2018-12-05 ENCOUNTER — Other Ambulatory Visit: Payer: Self-pay

## 2018-12-05 ENCOUNTER — Ambulatory Visit: Payer: BC Managed Care – PPO | Admitting: *Deleted

## 2018-12-05 VITALS — BP 138/84 | HR 88 | Temp 97.6°F | Resp 16 | Ht 72.5 in | Wt 238.4 lb

## 2018-12-05 DIAGNOSIS — E349 Endocrine disorder, unspecified: Secondary | ICD-10-CM | POA: Diagnosis not present

## 2018-12-05 MED ORDER — TESTOSTERONE CYPIONATE 200 MG/ML IM SOLN
400.0000 mg | Freq: Once | INTRAMUSCULAR | Status: AC
  Administered 2018-12-05: 400 mg via INTRAMUSCULAR

## 2018-12-05 NOTE — Progress Notes (Signed)
Patient is here for a NV for his Testosterone Cypionate 200 mg/ml 2 ml injection IM in the left upper outer quadrant. Patient tolerated well.

## 2018-12-17 ENCOUNTER — Encounter: Payer: Self-pay | Admitting: Physician Assistant

## 2018-12-30 ENCOUNTER — Ambulatory Visit (INDEPENDENT_AMBULATORY_CARE_PROVIDER_SITE_OTHER): Payer: BC Managed Care – PPO | Admitting: *Deleted

## 2018-12-30 ENCOUNTER — Other Ambulatory Visit: Payer: Self-pay | Admitting: Internal Medicine

## 2018-12-30 ENCOUNTER — Other Ambulatory Visit: Payer: Self-pay

## 2018-12-30 VITALS — BP 122/86 | HR 76 | Temp 97.0°F | Resp 16 | Ht 72.5 in | Wt 233.4 lb

## 2018-12-30 DIAGNOSIS — E349 Endocrine disorder, unspecified: Secondary | ICD-10-CM

## 2018-12-30 MED ORDER — TESTOSTERONE CYPIONATE 200 MG/ML IM SOLN: INTRAMUSCULAR | 2 refills | Status: DC

## 2018-12-30 MED ORDER — TESTOSTERONE CYPIONATE 200 MG/ML IM SOLN
400.0000 mg | Freq: Once | INTRAMUSCULAR | Status: AC
  Administered 2018-12-30: 12:00:00 400 mg via INTRAMUSCULAR

## 2018-12-30 NOTE — Progress Notes (Signed)
Patient is here for a NV for his Testosterone Cypionate 200 mg/ml 2 ml IM injection in his right upper outer quadrant. Patient tolerated well.

## 2019-01-06 ENCOUNTER — Encounter: Payer: Self-pay | Admitting: Physician Assistant

## 2019-01-06 ENCOUNTER — Ambulatory Visit: Payer: BC Managed Care – PPO | Admitting: Physician Assistant

## 2019-01-06 ENCOUNTER — Other Ambulatory Visit: Payer: Self-pay

## 2019-01-06 VITALS — BP 140/80 | Temp 97.2°F | Wt 234.0 lb

## 2019-01-06 DIAGNOSIS — E559 Vitamin D deficiency, unspecified: Secondary | ICD-10-CM

## 2019-01-06 DIAGNOSIS — E349 Endocrine disorder, unspecified: Secondary | ICD-10-CM | POA: Diagnosis not present

## 2019-01-06 DIAGNOSIS — N182 Chronic kidney disease, stage 2 (mild): Secondary | ICD-10-CM

## 2019-01-06 DIAGNOSIS — E782 Mixed hyperlipidemia: Secondary | ICD-10-CM | POA: Diagnosis not present

## 2019-01-06 DIAGNOSIS — E1122 Type 2 diabetes mellitus with diabetic chronic kidney disease: Secondary | ICD-10-CM | POA: Diagnosis not present

## 2019-01-06 DIAGNOSIS — Z79899 Other long term (current) drug therapy: Secondary | ICD-10-CM | POA: Diagnosis not present

## 2019-01-06 DIAGNOSIS — E785 Hyperlipidemia, unspecified: Secondary | ICD-10-CM

## 2019-01-06 DIAGNOSIS — I10 Essential (primary) hypertension: Secondary | ICD-10-CM

## 2019-01-06 DIAGNOSIS — E1169 Type 2 diabetes mellitus with other specified complication: Secondary | ICD-10-CM

## 2019-01-06 DIAGNOSIS — E538 Deficiency of other specified B group vitamins: Secondary | ICD-10-CM

## 2019-01-06 NOTE — Patient Instructions (Signed)
RANGE OF A1C   Your A1C is a measure of your sugar over the past 3 months and is not affected by what you have eaten over the past few days. Diabetes increases your chances of stroke and heart attack over 300 % and is the leading cause of blindness and kidney failure in the [place]. Please make sure you decrease bad carbs like white bread, white rice, potatoes, corn, soft drinks, pasta, cereals, refined sugars, sweet tea, dried fruits, and fruit juice. Good carbs are okay to eat in moderation like sweet potatoes, brown rice, whole grain pasta/bread, most fruit (except dried fruit) and you can eat as many veggies as you want.   Greater than 6.5 is considered diabetic. Between 6.4 and 5.7 is prediabetic If your A1C is less than 5.7 you are NOT diabetic.  Targets for Glucose Readings: Time of Check Target for patients WITHOUT Diabetes Target for DIABETICS  Before Meals Less than 100  less than 150  Two hours after meals Less than 200  Less than 250    Diabetes is a very complicated disease...lets simplify it.  An easy way to look at it to understand the complications is if you think of the extra sugar floating in your blood stream as glass shards floating through your blood stream.    Diabetes affects your small vessels first: 1) The glass shards (sugar) scraps down the tiny blood vessels in your eyes and lead to diabetic retinopathy, the leading cause of blindness in the [place]. Diabetes is the leading cause of newly diagnosed adult (37 to 64 years of age) blindness in the [place].  2) The glass shards scratches down the tiny vessels of your legs leading to nerve damage called neuropathy and can lead to amputations of your feet. More than 60% of all non-traumatic amputations of lower limbs occur in people with diabetes.  3) Over time the small vessels in your brain are shredded and closed off, individually this does not cause any problems but over a long period of time many of the small  vessels being blocked can lead to Vascular Dementia.   4) Your kidney's are a filter system and have a "net" that keeps certain things in the body and lets bad things out. Sugar shreds this net and leads to kidney damage and eventually failure. Decreasing the sugar that is destroying the net and certain blood pressure medications can help stop or decrease progression of kidney disease. Diabetes was the primary cause of kidney failure in 44 percent of all new cases in 2011.  5) Diabetes also destroys the small vessels in your penis that lead to erectile dysfunction. Eventually the vessels are so damaged that you may not be responsive to cialis or viagra.   Diabetes and your large vessels: Your larger vessels consist of your coronary arteries in your heart and the carotid vessels to your brain. Diabetes or even increased sugars put you at 300% increased risk of heart attack and stroke and this is why.. The sugar scrapes down your large blood vessels and your body sees this as an internal injury and tries to repair itself. Just like you get a scab on your skin, your platelets will stick to the blood vessel wall trying to heal it. This is why we have diabetics on low dose aspirin daily, this prevents the platelets from sticking and can prevent plaque formation. In addition, your body takes cholesterol and tries to shove it into the open wound. This is why  we want your LDL, or bad cholesterol, below 70.   The combination of platelets and cholesterol over 5-10 years forms plaque that can break off and cause a heart attack or stroke.   PLEASE REMEMBER:  Diabetes is preventable! Up to 93 percent of complications and morbidities among individuals with type 2 diabetes can be prevented, delayed, or effectively treated and minimized with regular visits to a health professional, appropriate monitoring and medication, and a healthy diet and lifestyle.   Diabetes or even increased sugars put you at 300% increased  risk of heart attack and stroke.  ALSO BEING DIABETIC YOU MAY NOT HAVE ANY PAIN WITH A HEART ATTACK.  Even worse of a chance of no pain if you are a woman.  It is very unlikely that you will have any pain with a heart attack. Likely your symptoms will be very subtle, even for very severe disease.  Your symptoms for a heart attack will likely occur when you exert your self or exercise and include: Shortness of breath Sweating Nausea Dizziness Fast or irregular heart beats Fatigue   It makes me feel better if my diabetics get their heart rate up with exercise once or twice a week and pay close attention to your body. If there is ANY change in your exercise capacity or if you have symptoms above, please STOP and call 911 or call to come to the office.   PLEASE REMEMBER:  Diabetes is preventable! Up to 9 percent of complications and morbidities among individuals with type 2 diabetes can be prevented, delayed, or effectively treated and minimized with regular visits to a health professional, appropriate monitoring and medication, and a healthy diet and lifestyle.   Here is some information to help you keep your heart healthy: Move it! - Aim for 30 mins of activity every day. Take it slowly at first. Talk to [place] before starting any new exercise program.   Lose it.  -Body Mass Index (BMI) can indicate if you need to lose weight. A healthy range is 18.5-24.9. For a BMI calculator, go to [employer]  Waist Management -Excess abdominal fat is a risk factor for heart disease, diabetes, asthma, stroke and more. Ideal waist circumference is less than 35" for women and less than 40" for men.   Eat Right -focus on fruits, vegetables, whole grains, and meals you make yourself. Avoid foods with trans fat and high sugar/sodium content.   Snooze or Snore? - Loud snoring can be a sign of sleep apnea, a significant risk factor for high blood pressure, heart attach, stroke, and heart  arrhythmias.  Kick the habit -Quit Smoking! Avoid second hand smoke. A single cigarette raises your blood pressure for 20 mins and increases the risk of heart attack and stroke for the next 24 hours.   Are Aspirin and Supplements right for you? -Add ENTERIC COATED low dose 81 mg Aspirin daily OR can do every other day if you have easy bruising to protect your heart and head. As well as to reduce risk of Colon Cancer by 20 %, Skin Cancer by 26 % , Melanoma by 46% and Pancreatic cancer by 60%  Say "No to Stress -There may be little you can do about problems that cause stress. However, techniques such as long walks, meditation, and exercise can help you manage it.   Start Now! - Make changes one at a time and set reasonable goals to increase your likelihood of success.

## 2019-01-06 NOTE — Progress Notes (Signed)
Assessment and Plan:   Hypertension -Continue medication, monitor blood pressure at home. Continue DASH diet.  Reminder to go to the ER if any CP, SOB, nausea, dizziness, severe HA, changes vision/speech, left arm numbness and tingling and jaw pain.  Cholesterol -Continue diet and exercise. Check cholesterol.  - has been on pravastatin daily, goal less than 70  Diabetes with diabetic chronic kidney disease -Continue diet and exercise. Check A1C - off metformin due to stomach issues, working on diet for last 3 months, if still elevated will do 1 month follow up Rule out LADA since patient claims he eats "no sugars"  Vitamin D Def - check level and continue medications.   Morbid Obesity with co morbidities - long discussion about weight loss, diet, and exercise  Hypogonadism - continue to monitor, states medication is helping with symptoms of low T.   Continue diet and meds as discussed. Further disposition pending results of labs. Discussed med's effects and SE's.    Over 30 minutes of exam, counseling, chart review, and critical decision making was performed Future Appointments  Date Time Provider [place]  01/06/2019  8:45 AM Vicie Mutters, PA-C [facility] None  04/09/2019  8:45 AM Liane Comber, NP [facility] None  09/01/2019 10:00 AM Liane Comber, NP [facility] None  '  HPI 63 y.o. male  presents for 3 month follow up on hypertension, cholesterol, diabetes and vitamin D deficiency.    His blood pressure has been controlled at home, today his BP is BP: 140/80.  He does workout, going back to the gym. He denies chest pain, shortness of breath, dizziness. He has not been on a B12 was low last visit.  Lab Results  Component Value Date   RJJOACZY60 630 08/27/2018    BMI is Body mass index is 31.3 kg/m., he is working on diet and exercise.  Wt Readings from Last 3 Encounters:  01/06/19 234 lb (106.1 kg)  12/30/18 233 lb 6.4 oz (105.9 kg)  12/05/18 238 lb  6.4 oz (108.1 kg)    He is on cholesterol medication and denies myalgias. His cholesterol is not at goal. The cholesterol was:   Lab Results  Component Value Date   CHOL 196 08/27/2018   HDL 36 (L) 08/27/2018   LDLCALC 122 (H) 08/27/2018   TRIG 235 (H) 08/27/2018   CHOLHDL 5.4 (H) 08/27/2018    He has been working on diet and exercise for diabetes  with CKD he is NOT on ACE/ARB With hyperlipidemia on pravastatin 40 mg and NOT at goal- he could not tolerate lipitor.  He has been off metformin x 1 year  denies  paresthesia of the feet, polydipsia, polyuria and visual disturbances  Last A1C was:  Lab Results  Component Value Date   HGBA1C 8.8 (H) 08/27/2018  Last GFR:   Lab Results  Component Value Date   GFRNONAA 72 08/27/2018    Patient is on Vitamin D supplement, but has not been on lately.  Lab Results  Component Value Date   VD25OH 54 (L) 08/27/2018   He has a history of testosterone deficiency and is on testosterone replacement, shot today He states that the testosterone helps with his energy, libido, muscle mass. Lab Results  Component Value Date   TESTOSTERONE 419 08/27/2018    Current Medications:  Current Outpatient Medications on File Prior to Visit  Medication Sig Dispense Refill  . aspirin 81 MG chewable tablet Chew by mouth daily.    . Cholecalciferol 1000 units capsule Take  5,000 Units by mouth daily.    . pravastatin (PRAVACHOL) 40 MG tablet Start 1/2 pill a day, may increase to 1 pill pending labs and reaction 30 tablet 3  . tamsulosin (FLOMAX) 0.4 MG CAPS capsule Take 1 capsule (0.4 mg total) by mouth daily. 90 capsule 1  . testosterone cypionate (DEPOTESTOSTERONE CYPIONATE) 200 MG/ML injection Inject 2 ml into muscle every 2 weeks 10 mL 2  . zinc gluconate 50 MG tablet Take 50 mg by mouth daily.     No current facility-administered medications on file prior to visit.    Medical History:  Past Medical History:  Diagnosis Date  . GERD (gastroesophageal  reflux disease)   . Hyperlipidemia   . Hypertension   . Other testicular hypofunction   . Prediabetes   . Vitamin D deficiency    Allergies:  Allergies  Allergen Reactions  . Codeine     REACTION: Anxiety     Review of Systems:  Review of Systems  Constitutional: Negative for chills, diaphoresis, fever, malaise/fatigue and weight loss.  HENT: Negative for congestion, ear discharge, ear pain, hearing loss, nosebleeds, sore throat and tinnitus.   Eyes: Negative for blurred vision, double vision, photophobia, pain, discharge and redness.  Respiratory: Negative.  Negative for stridor.   Cardiovascular: Negative.  Negative for chest pain.  Gastrointestinal: Negative.   Genitourinary: Negative for dysuria, flank pain, frequency, hematuria and urgency.  Musculoskeletal: Negative for back pain, falls, joint pain, myalgias and neck pain.  Skin: Negative.   Neurological: Negative for dizziness, tingling, tremors, sensory change, speech change, focal weakness, seizures, loss of consciousness, weakness and headaches.  Psychiatric/Behavioral: Negative.  Negative for depression. The patient is not nervous/anxious.     Family history- Review and unchanged Social history- Review and unchanged Physical Exam: BP 140/80   Temp (!) 97.2 F (36.2 C)   Wt 234 lb (106.1 kg)   BMI 31.30 kg/m  Wt Readings from Last 3 Encounters:  01/06/19 234 lb (106.1 kg)  12/30/18 233 lb 6.4 oz (105.9 kg)  12/05/18 238 lb 6.4 oz (108.1 kg)   General Appearance: Well nourished, in no apparent distress. Eyes: PERRLA, EOMs, conjunctiva no swelling or erythema Sinuses: no Frontal/maxillary tenderness ENT/Mouth: Ext aud canals clear, TMs without erythema, bulging. No erythema, swelling, or exudate on post pharynx.  Tonsils not swollen or erythematous. Hearing normal.  Neck: Supple, thyroid normal.  Respiratory: Respiratory effort normal, BS equal bilaterally without rales, rhonchi, wheezing or stridor.  Cardio:  RRR with no MRGs. Brisk peripheral pulses without edema.  Abdomen: Soft, + BS.  Non tender, no guarding, rebound, hernias, masses. Lymphatics: Non tender without lymphadenopathy.  Musculoskeletal: Full ROM, 5/5 strength, Normal gait Skin: Warm, dry without rashes, lesions, ecchymosis.  Neuro: Cranial nerves intact. No cerebellar symptoms.  Psych: Awake and oriented X 3, normal affect, Insight and Judgment appropriate.    Quentin Mulling, PA-C 8:44 AM [facility]

## 2019-01-07 ENCOUNTER — Ambulatory Visit: Payer: BC Managed Care – PPO | Admitting: Adult Health Nurse Practitioner

## 2019-01-07 ENCOUNTER — Ambulatory Visit: Payer: BC Managed Care – PPO | Admitting: Adult Health

## 2019-01-07 LAB — CBC WITH DIFFERENTIAL/PLATELET
Absolute Monocytes: 606 cells/uL (ref 200–950)
Basophils Absolute: 30 cells/uL (ref 0–200)
Basophils Relative: 0.5 %
Eosinophils Absolute: 132 cells/uL (ref 15–500)
Eosinophils Relative: 2.2 %
HCT: 49.8 % (ref 38.5–50.0)
Hemoglobin: 16.3 g/dL (ref 13.2–17.1)
Lymphs Abs: 1878 cells/uL (ref 850–3900)
MCH: 28.6 pg (ref 27.0–33.0)
MCHC: 32.7 g/dL (ref 32.0–36.0)
MCV: 87.4 fL (ref 80.0–100.0)
MPV: 11 fL (ref 7.5–12.5)
Monocytes Relative: 10.1 %
Neutro Abs: 3354 cells/uL (ref 1500–7800)
Neutrophils Relative %: 55.9 %
Platelets: 201 10*3/uL (ref 140–400)
RBC: 5.7 10*6/uL (ref 4.20–5.80)
RDW: 12.3 % (ref 11.0–15.0)
Total Lymphocyte: 31.3 %
WBC: 6 10*3/uL (ref 3.8–10.8)

## 2019-01-07 LAB — MAGNESIUM: Magnesium: 2.1 mg/dL (ref 1.5–2.5)

## 2019-01-07 LAB — COMPLETE METABOLIC PANEL WITH GFR
AG Ratio: 1.5 (calc) (ref 1.0–2.5)
ALT: 25 U/L (ref 9–46)
AST: 20 U/L (ref 10–35)
Albumin: 4.3 g/dL (ref 3.6–5.1)
Alkaline phosphatase (APISO): 91 U/L (ref 35–144)
BUN: 9 mg/dL (ref 7–25)
CO2: 29 mmol/L (ref 20–32)
Calcium: 9.2 mg/dL (ref 8.6–10.3)
Chloride: 102 mmol/L (ref 98–110)
Creat: 1.04 mg/dL (ref 0.70–1.25)
GFR, Est African American: 88 mL/min/{1.73_m2} (ref >=60)
GFR, Est Non African American: 76 mL/min/{1.73_m2} (ref >=60)
Globulin: 2.8 g/dL (calc) (ref 1.9–3.7)
Glucose, Bld: 170 mg/dL — ABNORMAL HIGH (ref 65–99)
Potassium: 4.6 mmol/L (ref 3.5–5.3)
Sodium: 139 mmol/L (ref 135–146)
Total Bilirubin: 0.4 mg/dL (ref 0.2–1.2)
Total Protein: 7.1 g/dL (ref 6.1–8.1)

## 2019-01-07 LAB — LIPID PANEL
Cholesterol: 146 mg/dL (ref <200)
HDL: 32 mg/dL — ABNORMAL LOW (ref >=40)
LDL Cholesterol (Calc): 89 mg/dL (calc)
Non-HDL Cholesterol (Calc): 114 mg/dL (calc) (ref <130)
Total CHOL/HDL Ratio: 4.6 (calc) (ref <5.0)
Triglycerides: 149 mg/dL (ref <150)

## 2019-01-07 LAB — HEMOGLOBIN A1C
Hgb A1c MFr Bld: 7.8 % of total Hgb — ABNORMAL HIGH (ref <5.7)
Mean Plasma Glucose: 177 (calc)
eAG (mmol/L): 9.8 (calc)

## 2019-01-07 LAB — TSH: TSH: 1.35 mIU/L (ref 0.40–4.50)

## 2019-01-07 LAB — TESTOSTERONE: Testosterone: 1212 ng/dL — ABNORMAL HIGH (ref 250–827)

## 2019-01-07 LAB — VITAMIN D 25 HYDROXY (VIT D DEFICIENCY, FRACTURES): Vit D, 25-Hydroxy: 16 ng/mL — ABNORMAL LOW (ref 30–100)

## 2019-01-09 ENCOUNTER — Other Ambulatory Visit: Payer: Self-pay | Admitting: Physician Assistant

## 2019-01-09 MED ORDER — EZETIMIBE 10 MG PO TABS: 10.0000 mg | ORAL_TABLET | Freq: Every day | ORAL | 1 refills | Status: DC

## 2019-01-11 LAB — C-PEPTIDE: C-Peptide: 2.24 ng/mL (ref 0.80–3.85)

## 2019-01-11 LAB — INSULIN ANTIBODIES, BLOOD: Insulin Antibodies, Human: <0.4 U/mL (ref <0.4)

## 2019-01-11 LAB — GLUTAMIC ACID DECARBOXYLASE AUTO ABS: Glutamic Acid Decarb Ab: <5 IU/mL (ref <5)

## 2019-01-22 ENCOUNTER — Other Ambulatory Visit: Payer: Self-pay | Admitting: Physician Assistant

## 2019-01-23 ENCOUNTER — Ambulatory Visit: Payer: BC Managed Care – PPO

## 2019-01-28 ENCOUNTER — Telehealth: Payer: Self-pay | Admitting: Physician Assistant

## 2019-01-28 ENCOUNTER — Other Ambulatory Visit: Payer: Self-pay | Admitting: Internal Medicine

## 2019-01-28 DIAGNOSIS — E349 Endocrine disorder, unspecified: Secondary | ICD-10-CM

## 2019-01-28 MED ORDER — TESTOSTERONE CYPIONATE 200 MG/ML IM SOLN: INTRAMUSCULAR | 2 refills | Status: DC

## 2019-01-28 NOTE — Telephone Encounter (Signed)
-----   Message from Elenor Quinones, [place] sent at 01/27/2019  4:29 PM EST ----- Regarding: REFILL Per pt refill on TESTOSTERONE  Please & Thank you!   Fyi: Pharmacy:   [facility]

## 2019-01-30 ENCOUNTER — Other Ambulatory Visit: Payer: Self-pay | Admitting: Internal Medicine

## 2019-01-30 DIAGNOSIS — E349 Endocrine disorder, unspecified: Secondary | ICD-10-CM

## 2019-01-30 MED ORDER — TESTOSTERONE CYPIONATE 200 MG/ML IM SOLN: INTRAMUSCULAR | 2 refills | Status: DC

## 2019-02-04 ENCOUNTER — Ambulatory Visit (INDEPENDENT_AMBULATORY_CARE_PROVIDER_SITE_OTHER): Payer: BC Managed Care – PPO

## 2019-02-04 ENCOUNTER — Other Ambulatory Visit: Payer: Self-pay

## 2019-02-04 VITALS — BP 128/74 | HR 68 | Temp 97.3°F | Wt 228.0 lb

## 2019-02-04 DIAGNOSIS — Z79899 Other long term (current) drug therapy: Secondary | ICD-10-CM | POA: Diagnosis not present

## 2019-02-04 DIAGNOSIS — E349 Endocrine disorder, unspecified: Secondary | ICD-10-CM | POA: Diagnosis not present

## 2019-02-04 MED ORDER — TESTOSTERONE CYPIONATE 200 MG/ML IM SOLN
200.0000 mg | INTRAMUSCULAR | Status: DC
  Administered 2019-02-04: 200 mg via INTRAMUSCULAR

## 2019-02-04 NOTE — Progress Notes (Signed)
Patient is here for a NV for his Testosterone Cypionate 200 mg/ml 2 ml IM injection in his LEFT upper outer quadrant. Patient tolerated well. 

## 2019-02-24 ENCOUNTER — Ambulatory Visit: Payer: BC Managed Care – PPO | Admitting: Adult Health

## 2019-02-24 ENCOUNTER — Other Ambulatory Visit: Payer: Self-pay

## 2019-02-24 ENCOUNTER — Encounter: Payer: Self-pay | Admitting: Adult Health

## 2019-02-24 VITALS — BP 124/72 | HR 76 | Temp 97.7°F | Ht 72.5 in | Wt 232.0 lb

## 2019-02-24 DIAGNOSIS — E349 Endocrine disorder, unspecified: Secondary | ICD-10-CM

## 2019-02-24 DIAGNOSIS — M545 Low back pain, unspecified: Secondary | ICD-10-CM

## 2019-02-24 MED ORDER — CYCLOBENZAPRINE HCL 5 MG PO TABS: 5.0000 mg | ORAL_TABLET | Freq: Three times a day (TID) | ORAL | 0 refills | Status: DC | PRN

## 2019-02-24 MED ORDER — PREDNISONE 20 MG PO TABS: ORAL_TABLET | ORAL | 0 refills | Status: AC

## 2019-02-24 MED ORDER — TESTOSTERONE CYPIONATE 200 MG/ML IM SOLN
200.0000 mg | Freq: Once | INTRAMUSCULAR | Status: AC
  Administered 2019-02-24: 15:00:00 200 mg via INTRAMUSCULAR

## 2019-02-24 NOTE — Progress Notes (Signed)
Patient is here for a NV for his Testosterone Cypionate 200 mg/ml 2 ml IM injection in his RIGHTupper outer quadrant. Patient tolerated well. 

## 2019-02-24 NOTE — Patient Instructions (Signed)
Acute Back Pain, Adult Acute back pain is sudden and usually short-lived. It is often caused by an injury to the muscles and tissues in the back. The injury may result from:  A muscle or ligament getting overstretched or torn (strained). Ligaments are tissues that connect bones to each other. Lifting something improperly can cause a back strain.  Wear and tear (degeneration) of the spinal disks. Spinal disks are circular tissue that provides cushioning between the bones of the spine (vertebrae).  Twisting motions, such as while playing sports or doing yard work.  A hit to the back.  Arthritis. You may have a physical exam, lab tests, and imaging tests to find the cause of your pain. Acute back pain usually goes away with rest and home care. Follow these instructions at home: Managing pain, stiffness, and swelling  Take over-the-counter and prescription medicines only as told by your health care provider.  Your health care provider may recommend applying ice during the first 24-48 hours after your pain starts. To do this: ? Put ice in a plastic bag. ? Place a towel between your skin and the bag. ? Leave the ice on for 20 minutes, 2-3 times a day.  If directed, apply heat to the affected area as often as told by your health care provider. Use the heat source that your health care provider recommends, such as a moist heat pack or a heating pad. ? Place a towel between your skin and the heat source. ? Leave the heat on for 20-30 minutes. ? Remove the heat if your skin turns bright red. This is especially important if you are unable to feel pain, heat, or cold. You have a greater risk of getting burned. Activity   Do not stay in bed. Staying in bed for more than 1-2 days can delay your recovery.  Sit up and stand up straight. Avoid leaning forward when you sit, or hunching over when you stand. ? If you work at a desk, sit close to it so you do not need to lean over. Keep your chin tucked  in. Keep your neck drawn back, and keep your elbows bent at a right angle. Your arms should look like the letter "L." ? Sit high and close to the steering wheel when you drive. Add lower back (lumbar) support to your car seat, if needed.  Take short walks on even surfaces as soon as you are able. Try to increase the length of time you walk each day.  Do not sit, drive, or stand in one place for more than 30 minutes at a time. Sitting or standing for long periods of time can put stress on your back.  Do not drive or use heavy machinery while taking prescription pain medicine.  Use proper lifting techniques. When you bend and lift, use positions that put less stress on your back: ? Bend your knees. ? Keep the load close to your body. ? Avoid twisting.  Exercise regularly as told by your health care provider. Exercising helps your back heal faster and helps prevent back injuries by keeping muscles strong and flexible.  Work with a physical therapist to make a safe exercise program, as recommended by your health care provider. Do any exercises as told by your physical therapist. Lifestyle  Maintain a healthy weight. Extra weight puts stress on your back and makes it difficult to have good posture.  Avoid activities or situations that make you feel anxious or stressed. Stress and anxiety increase muscle   tension and can make back pain worse. Learn ways to manage anxiety and stress, such as through exercise. General instructions  Sleep on a firm mattress in a comfortable position. Try lying on your side with your knees slightly bent. If you lie on your back, put a pillow under your knees.  Follow your treatment plan as told by your health care provider. This may include: ? Cognitive or behavioral therapy. ? Acupuncture or massage therapy. ? Meditation or yoga. Contact a health care provider if:  You have pain that is not relieved with rest or medicine.  You have increasing pain going down  into your legs or buttocks.  Your pain does not improve after 2 weeks.  You have pain at night.  You lose weight without trying.  You have a fever or chills. Get help right away if:  You develop new bowel or bladder control problems.  You have unusual weakness or numbness in your arms or legs.  You develop nausea or vomiting.  You develop abdominal pain.  You feel faint. Summary  Acute back pain is sudden and usually short-lived.  Use proper lifting techniques. When you bend and lift, use positions that put less stress on your back.  Take over-the-counter and prescription medicines and apply heat or ice as directed by your health care provider. This information is not intended to replace advice given to you by your health care provider. Make sure you discuss any questions you have with your health care provider. Document Released: 03/13/2005 Document Revised: 07/02/2018 Document Reviewed: 10/25/2016 Elsevier Patient Education  [place].     Cyclobenzaprine tablets What is this medicine? CYCLOBENZAPRINE (sye kloe BEN za preen) is a muscle relaxer. It is used to treat muscle pain, spasms, and stiffness. This medicine may be used for other purposes; ask your health care provider or pharmacist if you have questions. COMMON BRAND NAME(S): Fexmid, Flexeril What should I tell my health care provider before I take this medicine? They need to know if you have any of these conditions:  heart disease, irregular heartbeat, or previous heart attack  liver disease  thyroid problem  an unusual or allergic reaction to cyclobenzaprine, tricyclic antidepressants, lactose, other medicines, foods, dyes, or preservatives  pregnant or trying to get pregnant  breast-feeding How should I use this medicine? Take this medicine by mouth with a glass of water. Follow the directions on the prescription label. If this medicine upsets your stomach, take it with food or milk. Take  your medicine at regular intervals. Do not take it more often than directed. Talk to your pediatrician regarding the use of this medicine in children. Special care may be needed. Overdosage: If you think you have taken too much of this medicine contact a poison control center or emergency room at once. NOTE: This medicine is only for you. Do not share this medicine with others. What if I miss a dose? If you miss a dose, take it as soon as you can. If it is almost time for your next dose, take only that dose. Do not take double or extra doses. What may interact with this medicine? Do not take this medicine with any of the following medications:  MAOIs like Carbex, Eldepryl, Marplan, Nardil, and Parnate  narcotic medicines for cough  safinamide This medicine may also interact with the following medications:  alcohol  bupropion  antihistamines for allergy, cough and cold  certain medicines for anxiety or sleep  certain medicines for bladder problems like  oxybutynin, tolterodine  certain medicines for depression like amitriptyline, fluoxetine, sertraline  certain medicines for Parkinson's disease like benztropine, trihexyphenidyl  certain medicines for seizures like phenobarbital, primidone  certain medicines for stomach problems like dicyclomine, hyoscyamine  certain medicines for travel sickness like scopolamine  general anesthetics like halothane, isoflurane, methoxyflurane, propofol  ipratropium  local anesthetics like lidocaine, pramoxine, tetracaine  medicines that relax muscles for surgery  narcotic medicines for pain  phenothiazines like chlorpromazine, mesoridazine, prochlorperazine, thioridazine  verapamil This list may not describe all possible interactions. Give your health care provider a list of all the medicines, herbs, non-prescription drugs, or dietary supplements you use. Also tell them if you smoke, drink alcohol, or use illegal drugs. Some items may  interact with your medicine. What should I watch for while using this medicine? Tell your doctor or health care professional if your symptoms do not start to get better or if they get worse. You may get drowsy or dizzy. Do not drive, use machinery, or do anything that needs mental alertness until you know how this medicine affects you. Do not stand or sit up quickly, especially if you are an older patient. This reduces the risk of dizzy or fainting spells. Alcohol may interfere with the effect of this medicine. Avoid alcoholic drinks. If you are taking another medicine that also causes drowsiness, you may have more side effects. Give your health care provider a list of all medicines you use. Your doctor will tell you how much medicine to take. Do not take more medicine than directed. Call emergency for help if you have problems breathing or unusual sleepiness. Your mouth may get dry. Chewing sugarless gum or sucking hard candy, and drinking plenty of water may help. Contact your doctor if the problem does not go away or is severe. What side effects may I notice from receiving this medicine? Side effects that you should report to your doctor or health care professional as soon as possible:  allergic reactions like skin rash, itching or hives, swelling of the face, lips, or tongue  breathing problems  chest pain  fast, irregular heartbeat  hallucinations  seizures  unusually weak or tired Side effects that usually do not require medical attention (report to your doctor or health care professional if they continue or are bothersome):  headache  nausea, vomiting This list may not describe all possible side effects. Call your doctor for medical advice about side effects. You may report side effects to FDA at 1-800-FDA-1088. Where should I keep my medicine? Keep out of the reach of children. Store at room temperature between 15 and 30 degrees C (59 and 86 degrees F). Keep container tightly  closed. Throw away any unused medicine after the expiration date. NOTE: This sheet is a summary. It may not cover all possible information. If you have questions about this medicine, talk to your doctor, pharmacist, or health care provider.  2020 Elsevier/Gold Standard (2018-02-13 12:49:26)

## 2019-02-24 NOTE — Addendum Note (Signed)
Addended by: Chancy Hurter on: 02/24/2019 03:12 PM   Modules accepted: Orders

## 2019-02-24 NOTE — Progress Notes (Signed)
Assessment and Plan:  Mark [name] was seen today for back pain.  Diagnoses and all orders for this visit:  Acute left-sided low back pain, unspecified whether sciatica present - negative straight leg Prednisone was prescribed,NSAIDs, RICE, and exercise given If not better follow up in office or will refer to PT/orthopedics. Natural history and expected course discussed. Questions answered. Neurosurgeon distributed. Proper lifting, bending technique discussed. Short (2-4 day) period of relative rest recommended until acute symptoms improve. Muscle relaxants per medication orders. Follow up if not significantly improved in 2 weeks or persistent longer than 12 weeks  Go to the ER if you have any new weakness in your legs, have trouble controlling your urine or bowels, or have worsening pain.  -     predniSONE (DELTASONE) 20 MG tablet; 3 tablets daily with food for 3 days, 2 tabs daily for 3 days, 1 tab a day for 5 days. -     cyclobenzaprine (FLEXERIL) 5 MG tablet; Take 1 tablet (5 mg total) by mouth 3 (three) times daily as needed for muscle spasms. Do not drive on med or take with alcohol. May cause drowsiness.  Further disposition pending results of labs. Discussed med's effects and SE's.   Over 15 minutes of exam, counseling, chart review, and critical decision making was performed.   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   He reports L sided lower back/upper hip pain that he noted 3 mornings ago as he was walking to the car, no  unsual activities; describes as intermittent, position dependant, sharp/stabbing, reports 7/10 with anterior radiation intermittently to the knee. Denies numbness, tingling, weakness, fever/chills, loss of bladder or bowel control. Reports remote history of lumbar back pain, 40 years ago, no problems since. No recent activities or lifting.   He has tried advil 800 mg q4h which does improve to 3-4/10 but only helps for a few hours. Tried heating pad, didn't help. Aggravated by standing up straight/with initial movement, improved by reclining or after he walks for a bit. Pain with rotating in chair in vehicle.   Past Medical History:  Diagnosis Date  . GERD (gastroesophageal reflux disease)   . Hyperlipidemia   . Hypertension   . Other testicular hypofunction   . Prediabetes   . Vitamin D deficiency      Allergies  Allergen Reactions  . Codeine     REACTION: Anxiety    Current Outpatient Medications on File Prior to Visit  Medication Sig  . aspirin 81 MG chewable tablet Chew by mouth daily.  . Cholecalciferol 1000 units capsule Take 5,000 Units by mouth daily.  Marland Kitchen ezetimibe (ZETIA) 10 MG tablet Take 1 tablet (10 mg total) by mouth daily.  . pravastatin (PRAVACHOL) 40 MG tablet (START) WITH  1/2 (ONE-HALF) TABLET BY MOUTH ONCE DAILY INCREASE TO 1 TABLET PENDING LABS REACTION  . tamsulosin (FLOMAX) 0.4 MG CAPS capsule Take 1 capsule (0.4 mg total) by mouth daily.  Marland Kitchen testosterone cypionate (DEPOTESTOSTERONE CYPIONATE) 200 MG/ML injection Inject 2 ml into muscle every 2 weeks  . zinc gluconate  50 MG tablet Take 50 mg by mouth daily.   Current Facility-Administered Medications on File Prior to Visit  Medication  . testosterone cypionate (DEPOTESTOSTERONE CYPIONATE) injection 200 mg    ROS: all negative except above.   Physical Exam:  BP 124/72   Pulse 76   Temp 97.7 F (36.5 C)   Ht 6' 0.5" (1.842 m)   Wt 232 lb (105.2 kg)   SpO2 96%   BMI 31.03 kg/m   General Appearance:  Well nourished, in no apparent distress. Eyes: PERRLA, EOMs, conjunctiva no swelling or erythema ENT/Mouth: Hearing normal.  Neck: Supple Respiratory: Respiratory effort normal, BS equal bilaterally without rales, rhonchi, wheezing or stridor.  Cardio: RRR with no MRGs. Brisk peripheral pulses without edema.  Abdomen: Soft, + BS.  Non tender, no guarding, rebound, hernias, masses. Lymphatics: Non tender without lymphadenopathy.  Musculoskeletal:  Patient is able to ambulate well. Gait is  Antalgic. Straight leg raising with dorsiflexion negative bilaterally for radicular symptoms. Sensory exam in the legs are normal. Knee reflexes are normal Ankle reflexes are normal Strength is normal and symmetric in arms and legs. There is SI tenderness to palpation.  There is not paraspinal muscle spasm.  There is not midline tenderness.  ROM of spine with  limited in all spheres due to pain.  Skin: Warm, dry without rashes, lesions, ecchymosis.  Neuro:Normal muscle tone, Sensation intact.  Psych: Awake and oriented X 3, normal affect, Insight and Judgment appropriate.     Dan Maker, NP 2:35 PM [facility]

## 2019-03-25 ENCOUNTER — Ambulatory Visit (INDEPENDENT_AMBULATORY_CARE_PROVIDER_SITE_OTHER): Payer: BC Managed Care – PPO | Admitting: *Deleted

## 2019-03-25 ENCOUNTER — Other Ambulatory Visit: Payer: Self-pay

## 2019-03-25 VITALS — BP 138/82 | HR 64 | Temp 97.4°F | Resp 16 | Wt 226.4 lb

## 2019-03-25 DIAGNOSIS — E349 Endocrine disorder, unspecified: Secondary | ICD-10-CM | POA: Diagnosis not present

## 2019-03-25 MED ORDER — TESTOSTERONE CYPIONATE 200 MG/ML IM SOLN
400.0000 mg | Freq: Once | INTRAMUSCULAR | Status: AC
  Administered 2019-03-25: 400 mg via INTRAMUSCULAR

## 2019-03-25 NOTE — Progress Notes (Signed)
Patient is here for a NV for his Testosterone Cypionate 200 mg/ml injection 2 ml IM in left upper outer quadrant. Tolerated well and will return in 2 weeks for next injection.

## 2019-04-03 ENCOUNTER — Ambulatory Visit: Payer: BC Managed Care – PPO | Admitting: Adult Health

## 2019-04-03 ENCOUNTER — Encounter: Payer: Self-pay | Admitting: Adult Health

## 2019-04-03 ENCOUNTER — Other Ambulatory Visit: Payer: Self-pay | Admitting: Adult Health

## 2019-04-03 ENCOUNTER — Other Ambulatory Visit: Payer: Self-pay

## 2019-04-03 VITALS — BP 140/80 | HR 78 | Temp 97.7°F | Wt 228.0 lb

## 2019-04-03 DIAGNOSIS — R350 Frequency of micturition: Secondary | ICD-10-CM

## 2019-04-03 DIAGNOSIS — N182 Chronic kidney disease, stage 2 (mild): Secondary | ICD-10-CM

## 2019-04-03 DIAGNOSIS — E1122 Type 2 diabetes mellitus with diabetic chronic kidney disease: Secondary | ICD-10-CM

## 2019-04-03 DIAGNOSIS — I10 Essential (primary) hypertension: Secondary | ICD-10-CM | POA: Diagnosis not present

## 2019-04-03 DIAGNOSIS — R8290 Unspecified abnormal findings in urine: Secondary | ICD-10-CM

## 2019-04-03 DIAGNOSIS — N401 Enlarged prostate with lower urinary tract symptoms: Secondary | ICD-10-CM

## 2019-04-03 MED ORDER — METFORMIN HCL ER 500 MG PO TB24: ORAL_TABLET | ORAL | 1 refills | Status: DC

## 2019-04-03 MED ORDER — LOSARTAN POTASSIUM 50 MG PO TABS: ORAL_TABLET | ORAL | 1 refills | Status: DC

## 2019-04-03 MED ORDER — TAMSULOSIN HCL 0.4 MG PO CAPS: 0.4000 mg | ORAL_CAPSULE | Freq: Every day | ORAL | 1 refills | Status: DC

## 2019-04-03 NOTE — Progress Notes (Signed)
Assessment and Plan:  Mark [name] was seen today for hypertension.  Diagnoses and all orders for this visit:  Essential hypertension Initiate medication: losartan 25 mg 1 tab daily x 2 weeks, increase to 50 mg if remains above goal  Monitor blood pressure at home; call if consistently over 130/80 Discussed DASH diet Advised to go to the ER if any CP, SOB, nausea, dizziness, severe HA, changes vision/speech, left arm numbness and tingling and jaw pain. Follow up in 1 week as scheduled for routine follow up -     losartan (COZAAR) 50 MG tablet; Take 1/2- 1 tab daily by mouth for blood pressure goal <130/80.  Type 2 diabetes mellitus with stage 2 chronic kidney disease, without long-term current use of insulin [facility]) Education: Reviewed 'ABCs' of diabetes management (respective goals in parentheses):  A1C (<7), blood pressure (<130/80), and cholesterol (LDL <70) He is on ASA, statin; adding ARB for renal protection, restarting metformin today  Recommended he start metformin 500 mg ER 1 tab BID x 2 weeks then increase to 2 tabs BID with food Check fasting glucose daily, goal <130 Follow up in 1 week as scheduled for routine follow up -     metFORMIN (GLUCOPHAGE XR) 500 MG 24 hr tablet; Take 2 tablets twice daily by mouth for diabetes.  Abnormal urinalysis No sx; suspect contaminated specimen; recheck UA with reflex culture Send in abx only if suggestive of UTI -     Urinalysis w microscopic + reflex cultur   Further disposition pending results of labs. Discussed med's effects and SE's.   Over 30 minutes of exam, counseling, chart review, and critical decision making was performed.   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   He has not been checking BP at home, does have cuff, formerly on bisoprolol, today their BP is BP: 140/80 He denies chest pain, shortness of breath, dizziness.   He has been working on diet and exercise for T2 diabetes with CKD II, currently off of all treatments, per patient stopped metformin after recall last year and never restarted, A1C was improving with lifestyle, and denies increased appetite, nausea, paresthesia of the feet, polydipsia, polyuria, visual disturbances and vomiting. Last A1C in the office was:  Lab Results  Component Value Date   HGBA1C 7.8 (H) 01/06/2019     Past Medical History:  Diagnosis Date  . GERD (gastroesophageal reflux disease)   . Hyperlipidemia   . Hypertension   . Other testicular hypofunction   . Prediabetes   . Vitamin D deficiency      Allergies  Allergen Reactions  . Codeine     REACTION: Anxiety    Current Outpatient Medications on File Prior to Visit  Medication Sig  . aspirin 81 MG  chewable tablet Chew by mouth daily.  . Cholecalciferol 1000 units capsule Take 5,000 Units by mouth daily.  . cyclobenzaprine (FLEXERIL) 5 MG tablet Take 1 tablet (5 mg total) by mouth 3 (three) times daily as needed for muscle spasms. Do not drive on med or take with alcohol. May cause drowsiness.  . ezetimibe (ZETIA) 10 MG tablet Take 1 tablet (10 mg total) by mouth daily.  . tamsulosin (FLOMAX) 0.4 MG CAPS capsule Take 1 capsule (0.4 mg total)  by mouth daily.  Marland Kitchen testosterone cypionate (DEPOTESTOSTERONE CYPIONATE) 200 MG/ML injection Inject 2 ml into muscle every 2 weeks  . zinc gluconate 50 MG tablet Take 50 mg by mouth daily.  . pravastatin (PRAVACHOL) 40 MG tablet (START) WITH  1/2 (ONE-HALF) TABLET BY MOUTH ONCE DAILY INCREASE TO 1 TABLET PENDING LABS REACTION (Patient not taking: Reported on 04/03/2019)   Current Facility-Administered Medications on File Prior to Visit  Medication  . testosterone cypionate (DEPOTESTOSTERONE CYPIONATE) injection 200 mg    ROS: all negative except above.   Physical Exam:  BP 140/80   Pulse 78   Temp 97.7 F (36.5 C)   Wt 228 lb (103.4 kg)   SpO2 98%   BMI 30.50 kg/m   General Appearance: Well nourished, in no apparent distress. Eyes: PERRLA, EOMs, conjunctiva no swelling or erythema Sinuses: No Frontal/maxillary tenderness ENT/Mouth: Ext aud canals clear, TMs without erythema, bulging. No erythema, swelling, or exudate on post pharynx.  Tonsils not swollen or erythematous. Hearing normal.  Neck: Supple, thyroid normal.  Respiratory: Respiratory effort normal, BS equal bilaterally without rales, rhonchi, wheezing or stridor.  Cardio: RRR with no MRGs. Brisk peripheral pulses without edema.  Abdomen: Soft, + BS.  Non tender, no guarding, rebound, hernias, masses. Lymphatics: Non tender without lymphadenopathy.  Musculoskeletal: Full ROM, 5/5 strength, normal gait.  Skin: Warm, dry without rashes, lesions, ecchymosis.  Neuro: Cranial nerves intact. Normal muscle tone, no cerebellar symptoms. Sensation intact.  Psych: Awake and oriented X 3, normal affect, Insight and Judgment appropriate.     Dan Maker, NP 9:54 AM [facility]

## 2019-04-03 NOTE — Patient Instructions (Addendum)
Goals    . Blood Pressure < 130/80    . Fasting Blood Glucose <130    . HEMOGLOBIN A1C < 7         Losartan Tablets What is this medicine? LOSARTAN (loe SAR tan) is an angiotensin II receptor blocker, also known as an ARB. It treats high blood pressure. It can slow kidney damage in some patients. It may also be used to lower the risk of stroke. This medicine may be used for other purposes; ask your health care provider or pharmacist if you have questions. COMMON BRAND NAME(S): Cozaar What should I tell my health care provider before I take this medicine? They need to know if you have any of these conditions:  heart failure  kidney or liver disease  an unusual or allergic reaction to losartan, other medicines, foods, dyes, or preservatives  pregnant or trying to get pregnant  breast-feeding How should I use this medicine? Take this drug by mouth. Take it as directed on the prescription label at the same time every day. You can take it with or without food. If it upsets your stomach, take it with food. Keep taking it unless your health care provider tells you to stop. Talk to your health care provider about the use of this drug in children. While it may be prescribed for children as young as 6 for selected conditions, precautions do apply. Overdosage: If you think you have taken too much of this medicine contact a poison control center or emergency room at once. NOTE: This medicine is only for you. Do not share this medicine with others. What if I miss a dose? If you miss a dose, take it as soon as you can. If it is almost time for your next dose, take only that dose. Do not take double or extra doses. What may interact with this medicine?  blood pressure medicines  diuretics, especially triamterene, spironolactone, or amiloride  fluconazole  NSAIDs, medicines for pain and inflammation, like ibuprofen or naproxen  potassium salts or potassium supplements  rifampin This  list may not describe all possible interactions. Give your health care provider a list of all the medicines, herbs, non-prescription drugs, or dietary supplements you use. Also tell them if you smoke, drink alcohol, or use illegal drugs. Some items may interact with your medicine. What should I watch for while using this medicine? Visit your doctor or health care professional for regular checks on your progress. Check your blood pressure as directed. Ask your doctor or health care professional what your blood pressure should be and when you should contact him or her. Call your doctor or health care professional if you notice an irregular or fast heart beat. Women should inform their doctor if they wish to become pregnant or think they might be pregnant. There is a potential for serious side effects to an unborn child, particularly in the second or third trimester. Talk to your health care professional or pharmacist for more information. You may get drowsy or dizzy. Do not drive, use machinery, or do anything that needs mental alertness until you know how this drug affects you. Do not stand or sit up quickly, especially if you are an older patient. This reduces the risk of dizzy or fainting spells. Alcohol can make you more drowsy and dizzy. Avoid alcoholic drinks. Avoid salt substitutes unless you are told otherwise by your doctor or health care professional. Do not treat yourself for coughs, colds, or pain while you are taking  this medicine without asking your doctor or health care professional for advice. Some ingredients may increase your blood pressure. What side effects may I notice from receiving this medicine? Side effects that you should report to your doctor or health care professional as soon as possible:  confusion, dizziness, light headedness or fainting spells  decreased amount of urine passed  difficulty breathing or swallowing, hoarseness, or tightening of the throat  fast or irregular  heart beat, palpitations, or chest pain  skin rash, itching  swelling of your face, lips, tongue, hands, or feet Side effects that usually do not require medical attention (report to your doctor or health care professional if they continue or are bothersome):  cough  decreased sexual function or desire  headache  nasal congestion or stuffiness  nausea or stomach pain  sore or cramping muscles This list may not describe all possible side effects. Call your doctor for medical advice about side effects. You may report side effects to FDA at 1-800-FDA-1088. Where should I keep my medicine? Keep out of the reach of children and pets. Store at room temperature between 15 and 30 degrees C (59 and 86 degrees F). Protect from light. Keep the container tightly closed. Throw away any unused drug after the expiration date. NOTE: This sheet is a summary. It may not cover all possible information. If you have questions about this medicine, talk to your doctor, pharmacist, or health care provider.  2020 Elsevier/Gold Standard (2018-10-16 12:12:28)    Metformin extended-release tablets What is this medicine? METFORMIN (met FOR min) is used to treat type 2 diabetes. It helps to control blood sugar. Treatment is combined with diet and exercise. This medicine can be used alone or with other medicines for diabetes. This medicine may be used for other purposes; ask your health care provider or pharmacist if you have questions. COMMON BRAND NAME(S): Fortamet, Glucophage XR, Glumetza What should I tell my health care provider before I take this medicine? They need to know if you have any of these conditions:  anemia  dehydration  heart disease  frequently drink alcohol-containing beverages  kidney disease  liver disease  polycystic ovary syndrome  serious infection or injury  vomiting  an unusual or allergic reaction to metformin, other medicines, foods, dyes, or  preservatives  pregnant or trying to get pregnant  breast-feeding How should I use this medicine? Take this medicine by mouth with a glass of water. Follow the directions on the prescription label. Take this medicine with food. Take your medicine at regular intervals. Do not take your medicine more often than directed. Do not stop taking except on your doctor's advice. Talk to your pediatrician regarding the use of this medicine in children. Special care may be needed. Overdosage: If you think you have taken too much of this medicine contact a poison control center or emergency room at once. NOTE: This medicine is only for you. Do not share this medicine with others. What if I miss a dose? If you miss a dose, take it as soon as you can. If it is almost time for your next dose, take only that dose. Do not take double or extra doses. What may interact with this medicine? Do not take this medicine with any of the following medications:  certain contrast medicines given before X-rays, CT scans, MRI, or other procedures  dofetilide This medicine may also interact with the following medications:  acetazolamide  alcohol  certain antivirals for HIV or hepatitis  certain medicines  for blood pressure, heart disease, irregular heart beat  cimetidine  dichlorphenamide  digoxin  diuretics  male hormones, like estrogens or progestins and birth control pills  glycopyrrolate  isoniazid  lamotrigine  memantine  methazolamide  metoclopramide  midodrine  niacin  phenothiazines like chlorpromazine, mesoridazine, prochlorperazine, thioridazine  phenytoin  ranolazine  steroid medicines like prednisone or cortisone  stimulant medicines for attention disorders, weight loss, or to stay awake  thyroid medicines  topiramate  trospium  vandetanib  zonisamide This list may not describe all possible interactions. Give your health care provider a list of all the medicines,  herbs, non-prescription drugs, or dietary supplements you use. Also tell them if you smoke, drink alcohol, or use illegal drugs. Some items may interact with your medicine. What should I watch for while using this medicine? Visit your doctor or health care professional for regular checks on your progress. A test called the HbA1C (A1C) will be monitored. This is a simple blood test. It measures your blood sugar control over the last 2 to 3 months. You will receive this test every 3 to 6 months. Learn how to check your blood sugar. Learn the symptoms of low and high blood sugar and how to manage them. Always carry a quick-source of sugar with you in case you have symptoms of low blood sugar. Examples include hard sugar candy or glucose tablets. Make sure others know that you can choke if you eat or drink when you develop serious symptoms of low blood sugar, such as seizures or unconsciousness. They must get medical help at once. Tell your doctor or health care professional if you have high blood sugar. You might need to change the dose of your medicine. If you are sick or exercising more than usual, you might need to change the dose of your medicine. Do not skip meals. Ask your doctor or health care professional if you should avoid alcohol. Many nonprescription cough and cold products contain sugar or alcohol. These can affect blood sugar. This medicine may cause ovulation in premenopausal women who do not have regular monthly periods. This may increase your chances of becoming pregnant. You should not take this medicine if you become pregnant or think you may be pregnant. Talk with your doctor or health care professional about your birth control options while taking this medicine. Contact your doctor or health care professional right away if you think you are pregnant. The tablet shell for some brands of this medicine does not dissolve. This is normal. The tablet shell may appear whole in the stool. This is  not a cause for concern. If you are going to need surgery, a MRI, CT scan, or other procedure, tell your doctor that you are taking this medicine. You may need to stop taking this medicine before the procedure. Wear a medical ID bracelet or chain, and carry a card that describes your disease and details of your medicine and dosage times. This medicine may cause a decrease in folic acid and vitamin B12. You should make sure that you get enough vitamins while you are taking this medicine. Discuss the foods you eat and the vitamins you take with your health care professional. What side effects may I notice from receiving this medicine? Side effects that you should report to your doctor or health care professional as soon as possible:  allergic reactions like skin rash, itching or hives, swelling of the face, lips, or tongue  breathing problems  feeling faint or lightheaded, falls  muscle  aches or pains  signs and symptoms of low blood sugar such as feeling anxious, confusion, dizziness, increased hunger, unusually weak or tired, sweating, shakiness, cold, irritable, headache, blurred vision, fast heartbeat, loss of consciousness  slow or irregular heartbeat  unusual stomach pain or discomfort  unusually tired or weak Side effects that usually do not require medical attention (report to your doctor or health care professional if they continue or are bothersome):  diarrhea  headache  heartburn  metallic taste in mouth  nausea  stomach gas, upset This list may not describe all possible side effects. Call your doctor for medical advice about side effects. You may report side effects to FDA at 1-800-FDA-1088. Where should I keep my medicine? Keep out of the reach of children. Store at room temperature between 15 and 30 degrees C (59 and 86 degrees F). Protect from light. Throw away any unused medicine after the expiration date. NOTE: This sheet is a summary. It may not cover all  possible information. If you have questions about this medicine, talk to your doctor, pharmacist, or health care provider.  2020 Elsevier/Gold Standard (2017-04-19 18:58:32)

## 2019-04-05 LAB — URINALYSIS W MICROSCOPIC + REFLEX CULTURE
Bilirubin Urine: NEGATIVE
Hgb urine dipstick: NEGATIVE
Hyaline Cast: NONE SEEN /LPF
Nitrites, Initial: POSITIVE — AB
Protein, ur: NEGATIVE
Specific Gravity, Urine: 1.035 (ref 1.001–1.03)
pH: 6.5 (ref 5.0–8.0)

## 2019-04-05 LAB — URINE CULTURE
MICRO NUMBER:: 10021972
SPECIMEN QUALITY:: ADEQUATE

## 2019-04-05 LAB — CULTURE INDICATED

## 2019-04-07 ENCOUNTER — Other Ambulatory Visit: Payer: Self-pay | Admitting: Adult Health

## 2019-04-07 DIAGNOSIS — N390 Urinary tract infection, site not specified: Secondary | ICD-10-CM

## 2019-04-07 MED ORDER — NITROFURANTOIN MONOHYD MACRO 100 MG PO CAPS: 100.0000 mg | ORAL_CAPSULE | Freq: Two times a day (BID) | ORAL | 0 refills | Status: DC

## 2019-04-09 ENCOUNTER — Ambulatory Visit: Payer: BC Managed Care – PPO | Admitting: Adult Health

## 2019-04-23 ENCOUNTER — Ambulatory Visit: Payer: Self-pay | Admitting: Adult Health

## 2019-04-23 NOTE — Progress Notes (Deleted)
FOLLOW UP  Assessment and Plan:   Hypertension Well controlled with current medications  Monitor blood pressure at home; patient to call if consistently greater than 130/80 Continue DASH diet.   Reminder to go to the ER if any CP, SOB, nausea, dizziness, severe HA, changes vision/speech, left arm numbness and tingling and jaw pain.  Cholesterol associated with T2DM Currently above goal; pravastatin, zetia *** Continue low cholesterol diet and exercise.  Check lipid panel.   Diabetes with diabetic chronic kidney disease Continue medication: *** Continue diet and exercise.  Perform daily foot/skin check, notify office of any concerning changes.  Check A1C  CKD II associated with T2DM Increase fluids, avoid NSAIDS, monitor sugars, will monitor  Obesity with co morbidities Long discussion about weight loss, diet, and exercise Recommended diet heavy in fruits and veggies and low in animal meats, cheeses, and dairy products, appropriate calorie intake Discussed ideal weight for height (below ***) and initial weight goal (***) Patient will work on *** Will follow up in 3 months  Vitamin D Def Below goal at last visit; *** continue supplementation to maintain goal of 70-100 Defer Vit D level  Testosterone deficiency Continued perceived benefits with supplementation, no SE. Check level, continue zinc, no symptoms of elevated estrogen or dihydrotestosterone.    Continue diet and meds as discussed. Further disposition pending results of labs. Discussed med's effects and SE's.   Over 30 minutes of exam, counseling, chart review, and critical decision making was performed.   Future Appointments  Date Time Provider Department Center  04/23/2019  3:30 PM Judd Gaudier, NP [facility] None  09/01/2019 10:00 AM Judd Gaudier, NP [facility] None    ----------------------------------------------------------------------------------------------------------------------  HPI 64 y.o.  male  presents for 3 month follow up on hypertension, cholesterol, diabetes, CKD, obesity and vitamin D deficiency.   He had asymptomatic UTI per repeat UA and confirmed by culture on 04/03/2019 and was treated by macrobid.   He has reported snoring and AM sedation, was recommended sleep study but declined.   BMI is There is no height or weight on file to calculate BMI., he {HAS HAS OAC:16606} been working on diet and exercise. Wt Readings from Last 3 Encounters:  04/03/19 228 lb (103.4 kg)  03/25/19 226 lb 6.4 oz (102.7 kg)  02/24/19 232 lb (105.2 kg)   His blood pressure {HAS HAS NOT:18834} been controlled at home, today their BP is    He {DOES_DOES TKZ:60109} workout. He denies chest pain, shortness of breath, dizziness.   He is on cholesterol medication Pravastatin and Zetia and denies myalgias. His cholesterol is not at goal. The cholesterol last visit was:   Lab Results  Component Value Date   CHOL 146 01/06/2019   HDL 32 (L) 01/06/2019   LDLCALC 89 01/06/2019   TRIG 149 01/06/2019   CHOLHDL 4.6 01/06/2019    He {Has/has not:18111} been working on diet and exercise for T2 diabetes with CKD, and denies {Symptoms; diabetes w/o none:19199}. Last A1C in the office was:  Lab Results  Component Value Date   HGBA1C 7.8 (H) 01/06/2019    He has CKD II associated with T2DM and htn monitored at this office:  Lab Results  Component Value Date   [facility] 76 01/06/2019   Patient is on Vitamin D supplement.   Lab Results  Component Value Date   VD25OH 16 (L) 01/06/2019     He has a history of testosterone deficiency and is on testosterone replacement. He states that the testosterone helps with  his energy, libido, muscle mass. Denies symptoms of elevated estrogen or dihydrotestosterone.  Lab Results  Component Value Date   TESTOSTERONE 1,212 (H) 01/06/2019      Current Medications:  Current Outpatient Medications on File Prior to Visit  Medication Sig  . aspirin 81 MG chewable  tablet Chew by mouth daily.  . Cholecalciferol 1000 units capsule Take 5,000 Units by mouth daily.  . cyclobenzaprine (FLEXERIL) 5 MG tablet Take 1 tablet (5 mg total) by mouth 3 (three) times daily as needed for muscle spasms. Do not drive on med or take with alcohol. May cause drowsiness.  . ezetimibe (ZETIA) 10 MG tablet Take 1 tablet (10 mg total) by mouth daily.  Marland Kitchen losartan (COZAAR) 50 MG tablet Take 1/2- 1 tab daily by mouth for blood pressure goal <130/80.  Marland Kitchen metFORMIN (GLUCOPHAGE XR) 500 MG 24 hr tablet Take 2 tablets twice daily by mouth for diabetes.  . nitrofurantoin, macrocrystal-monohydrate, (MACROBID) 100 MG capsule Take 1 capsule (100 mg total) by mouth 2 (two) times daily.  . pravastatin (PRAVACHOL) 40 MG tablet (START) WITH  1/2 (ONE-HALF) TABLET BY MOUTH ONCE DAILY INCREASE TO 1 TABLET PENDING LABS REACTION (Patient not taking: Reported on 04/03/2019)  . tamsulosin (FLOMAX) 0.4 MG CAPS capsule Take 1 capsule (0.4 mg total) by mouth daily.  Marland Kitchen testosterone cypionate (DEPOTESTOSTERONE CYPIONATE) 200 MG/ML injection Inject 2 ml into muscle every 2 weeks  . zinc gluconate 50 MG tablet Take 50 mg by mouth daily.   Current Facility-Administered Medications on File Prior to Visit  Medication  . testosterone cypionate (DEPOTESTOSTERONE CYPIONATE) injection 200 mg     Allergies:  Allergies  Allergen Reactions  . Codeine     REACTION: Anxiety     Medical History:  Past Medical History:  Diagnosis Date  . GERD (gastroesophageal reflux disease)   . Hyperlipidemia   . Hypertension   . Other testicular hypofunction   . Prediabetes   . Vitamin D deficiency    Family history- Reviewed and unchanged Social history- Reviewed and unchanged   Review of Systems:  Review of Systems  Constitutional: Negative for malaise/fatigue and weight loss.  HENT: Negative for hearing loss and tinnitus.   Eyes: Negative for blurred vision and double vision.  Respiratory: Negative for cough,  shortness of breath and wheezing.   Cardiovascular: Negative for chest pain, palpitations, orthopnea, claudication and leg swelling.  Gastrointestinal: Negative for abdominal pain, blood in stool, constipation, diarrhea, heartburn, melena, nausea and vomiting.  Genitourinary: Negative.   Musculoskeletal: Negative for joint pain and myalgias.  Skin: Negative for rash.  Neurological: Negative for dizziness, tingling, sensory change, weakness and headaches.  Endo/Heme/Allergies: Negative for polydipsia.  Psychiatric/Behavioral: Negative.   All other systems reviewed and are negative.     Physical Exam: There were no vitals taken for this visit. Wt Readings from Last 3 Encounters:  04/03/19 228 lb (103.4 kg)  03/25/19 226 lb 6.4 oz (102.7 kg)  02/24/19 232 lb (105.2 kg)   General Appearance: Well nourished, in no apparent distress. Eyes: PERRLA, EOMs, conjunctiva no swelling or erythema Sinuses: No Frontal/maxillary tenderness ENT/Mouth: Ext aud canals clear, TMs without erythema, bulging. No erythema, swelling, or exudate on post pharynx.  Tonsils not swollen or erythematous. Hearing normal.  Neck: Supple, thyroid normal.  Respiratory: Respiratory effort normal, BS equal bilaterally without rales, rhonchi, wheezing or stridor.  Cardio: RRR with no MRGs. Brisk peripheral pulses without edema.  Abdomen: Soft, + BS.  Non tender, no guarding, rebound, hernias,  masses. Lymphatics: Non tender without lymphadenopathy.  Musculoskeletal: Full ROM, 5/5 strength, {PSY - GAIT AND STATION:22860} gait Skin: Warm, dry without rashes, lesions, ecchymosis.  Neuro: Cranial nerves intact. No cerebellar symptoms.  Psych: Awake and oriented X 3, normal affect, Insight and Judgment appropriate.    Izora Ribas, NP 8:15 AM [facility]

## 2019-04-24 ENCOUNTER — Other Ambulatory Visit: Payer: Self-pay

## 2019-04-24 ENCOUNTER — Ambulatory Visit (INDEPENDENT_AMBULATORY_CARE_PROVIDER_SITE_OTHER): Payer: BC Managed Care – PPO

## 2019-04-24 VITALS — BP 140/82 | HR 78 | Temp 97.7°F | Wt 228.0 lb

## 2019-04-24 DIAGNOSIS — E349 Endocrine disorder, unspecified: Secondary | ICD-10-CM

## 2019-04-24 MED ORDER — TESTOSTERONE CYPIONATE 200 MG/ML IM SOLN
200.0000 mg | Freq: Once | INTRAMUSCULAR | Status: AC
  Administered 2019-04-24: 200 mg via INTRAMUSCULAR

## 2019-04-24 NOTE — Progress Notes (Signed)
Patient is here for a NV for his Testosterone Cypionate 200 mg/ml injection 2 ml IM in RIGHT upper outer quadrant. Tolerated well and will return in 2 weeks for next injection.

## 2019-04-24 NOTE — Progress Notes (Deleted)
FOLLOW UP  Assessment and Plan:   Hypertension Well controlled with current medications  Monitor blood pressure at home; patient to call if consistently greater than 130/80 Continue DASH diet.   Reminder to go to the ER if any CP, SOB, nausea, dizziness, severe HA, changes vision/speech, left arm numbness and tingling and jaw pain.  Cholesterol associated with T2DM Currently above goal; pravastatin, zetia *** Continue low cholesterol diet and exercise.  Check lipid panel.   Diabetes with diabetic chronic kidney disease Continue medication: *** Continue diet and exercise.  Perform daily foot/skin check, notify office of any concerning changes.  Check A1C  CKD II associated with T2DM Increase fluids, avoid NSAIDS, monitor sugars, will monitor  Obesity with co morbidities Long discussion about weight loss, diet, and exercise Recommended diet heavy in fruits and veggies and low in animal meats, cheeses, and dairy products, appropriate calorie intake Discussed ideal weight for height (below ***) and initial weight goal (***) Patient will work on *** Will follow up in 3 months  Vitamin D Def Below goal at last visit; *** continue supplementation to maintain goal of 70-100 Defer Vit D level  Testosterone deficiency Continued perceived benefits with supplementation, no SE. Check level, continue zinc, no symptoms of elevated estrogen or dihydrotestosterone.    Continue diet and meds as discussed. Further disposition pending results of labs. Discussed med's effects and SE's.   Over 30 minutes of exam, counseling, chart review, and critical decision making was performed.   Future Appointments  Date Time Provider Department Center  05/05/2019 10:30 AM Judd Gaudier, NP [facility] None  09/01/2019 10:00 AM Judd Gaudier, NP [facility] None    ----------------------------------------------------------------------------------------------------------------------  HPI 64 y.o.  male  presents for 3 month follow up on hypertension, cholesterol, diabetes, CKD, obesity and vitamin D deficiency.   He had asymptomatic UTI per repeat UA and confirmed by culture on 04/03/2019 and was treated by macrobid.   He has reported snoring and AM sedation, was recommended sleep study but declined.   BMI is There is no height or weight on file to calculate BMI., he {HAS HAS PPI:95188} been working on diet and exercise. Wt Readings from Last 3 Encounters:  04/24/19 228 lb (103.4 kg)  04/03/19 228 lb (103.4 kg)  03/25/19 226 lb 6.4 oz (102.7 kg)   His blood pressure {HAS HAS NOT:18834} been controlled at home, today their BP is    He {DOES_DOES CZY:60630} workout. He denies chest pain, shortness of breath, dizziness.   He is on cholesterol medication Pravastatin and Zetia and denies myalgias. His cholesterol is not at goal. The cholesterol last visit was:   Lab Results  Component Value Date   CHOL 146 01/06/2019   HDL 32 (L) 01/06/2019   LDLCALC 89 01/06/2019   TRIG 149 01/06/2019   CHOLHDL 4.6 01/06/2019    He {Has/has not:18111} been working on diet and exercise for T2 diabetes with CKD, treated by metformin ***, he is on ASA, statin, ARB and denies {Symptoms; diabetes w/o none:19199}. Last A1C in the office was:  Lab Results  Component Value Date   HGBA1C 7.8 (H) 01/06/2019    He has CKD II associated with T2DM and htn monitored at this office:  Lab Results  Component Value Date   [facility] 76 01/06/2019   Patient is on Vitamin D supplement.   Lab Results  Component Value Date   VD25OH 16 (L) 01/06/2019     He has a history of testosterone deficiency and is on  testosterone replacement. He states that the testosterone helps with his energy, libido, muscle mass. Denies symptoms of elevated estrogen or dihydrotestosterone.  Lab Results  Component Value Date   TESTOSTERONE 1,212 (H) 01/06/2019      Current Medications:  Current Outpatient Medications on File Prior  to Visit  Medication Sig  . aspirin 81 MG chewable tablet Chew by mouth daily.  . Cholecalciferol 1000 units capsule Take 5,000 Units by mouth daily.  . cyclobenzaprine (FLEXERIL) 5 MG tablet Take 1 tablet (5 mg total) by mouth 3 (three) times daily as needed for muscle spasms. Do not drive on med or take with alcohol. May cause drowsiness.  . ezetimibe (ZETIA) 10 MG tablet Take 1 tablet (10 mg total) by mouth daily.  Marland Kitchen losartan (COZAAR) 50 MG tablet Take 1/2- 1 tab daily by mouth for blood pressure goal <130/80.  Marland Kitchen metFORMIN (GLUCOPHAGE XR) 500 MG 24 hr tablet Take 2 tablets twice daily by mouth for diabetes.  . nitrofurantoin, macrocrystal-monohydrate, (MACROBID) 100 MG capsule Take 1 capsule (100 mg total) by mouth 2 (two) times daily.  . pravastatin (PRAVACHOL) 40 MG tablet (START) WITH  1/2 (ONE-HALF) TABLET BY MOUTH ONCE DAILY INCREASE TO 1 TABLET PENDING LABS REACTION (Patient not taking: Reported on 04/03/2019)  . tamsulosin (FLOMAX) 0.4 MG CAPS capsule Take 1 capsule (0.4 mg total) by mouth daily.  Marland Kitchen testosterone cypionate (DEPOTESTOSTERONE CYPIONATE) 200 MG/ML injection Inject 2 ml into muscle every 2 weeks  . zinc gluconate 50 MG tablet Take 50 mg by mouth daily.   Current Facility-Administered Medications on File Prior to Visit  Medication  . testosterone cypionate (DEPOTESTOSTERONE CYPIONATE) injection 200 mg     Allergies:  Allergies  Allergen Reactions  . Codeine     REACTION: Anxiety     Medical History:  Past Medical History:  Diagnosis Date  . GERD (gastroesophageal reflux disease)   . Hyperlipidemia   . Hypertension   . Other testicular hypofunction   . Prediabetes   . Vitamin D deficiency    Family history- Reviewed and unchanged Social history- Reviewed and unchanged   Review of Systems:  Review of Systems  Constitutional: Negative for malaise/fatigue and weight loss.  HENT: Negative for hearing loss and tinnitus.   Eyes: Negative for blurred vision and  double vision.  Respiratory: Negative for cough, shortness of breath and wheezing.   Cardiovascular: Negative for chest pain, palpitations, orthopnea, claudication and leg swelling.  Gastrointestinal: Negative for abdominal pain, blood in stool, constipation, diarrhea, heartburn, melena, nausea and vomiting.  Genitourinary: Negative.   Musculoskeletal: Negative for joint pain and myalgias.  Skin: Negative for rash.  Neurological: Negative for dizziness, tingling, sensory change, weakness and headaches.  Endo/Heme/Allergies: Negative for polydipsia.  Psychiatric/Behavioral: Negative.   All other systems reviewed and are negative.     Physical Exam: There were no vitals taken for this visit. Wt Readings from Last 3 Encounters:  04/24/19 228 lb (103.4 kg)  04/03/19 228 lb (103.4 kg)  03/25/19 226 lb 6.4 oz (102.7 kg)   General Appearance: Well nourished, in no apparent distress. Eyes: PERRLA, EOMs, conjunctiva no swelling or erythema Sinuses: No Frontal/maxillary tenderness ENT/Mouth: Ext aud canals clear, TMs without erythema, bulging. No erythema, swelling, or exudate on post pharynx.  Tonsils not swollen or erythematous. Hearing normal.  Neck: Supple, thyroid normal.  Respiratory: Respiratory effort normal, BS equal bilaterally without rales, rhonchi, wheezing or stridor.  Cardio: RRR with no MRGs. Brisk peripheral pulses without edema.  Abdomen: Soft, +  BS.  Non tender, no guarding, rebound, hernias, masses. Lymphatics: Non tender without lymphadenopathy.  Musculoskeletal: Full ROM, 5/5 strength, {PSY - GAIT AND STATION:22860} gait Skin: Warm, dry without rashes, lesions, ecchymosis.  Neuro: Cranial nerves intact. No cerebellar symptoms.  Psych: Awake and oriented X 3, normal affect, Insight and Judgment appropriate.    Izora Ribas, NP 1:02 PM [facility]

## 2019-05-05 ENCOUNTER — Ambulatory Visit: Payer: Self-pay | Admitting: Adult Health

## 2019-05-26 ENCOUNTER — Ambulatory Visit (INDEPENDENT_AMBULATORY_CARE_PROVIDER_SITE_OTHER): Payer: BC Managed Care – PPO

## 2019-05-26 ENCOUNTER — Other Ambulatory Visit: Payer: Self-pay

## 2019-05-26 VITALS — BP 124/86 | HR 66 | Temp 97.6°F | Wt 231.0 lb

## 2019-05-26 DIAGNOSIS — E349 Endocrine disorder, unspecified: Secondary | ICD-10-CM

## 2019-05-26 MED ORDER — TESTOSTERONE CYPIONATE 200 MG/ML IM SOLN
200.0000 mg | INTRAMUSCULAR | Status: DC
  Administered 2019-05-26: 200 mg via INTRAMUSCULAR

## 2019-05-26 NOTE — Progress Notes (Signed)
Patient is here for a NV for his Testosterone Cypionate 200 mg/ml injection 2 ml IM in LEFT upper outer quadrant. Tolerated well and will return in 2 weeks for next injection.

## 2019-05-27 ENCOUNTER — Telehealth: Payer: Self-pay

## 2019-05-27 NOTE — Telephone Encounter (Signed)
Patient states that Losartan is upsetting his stomach and giving him chest pains. Requesting something else be sent into the pharmacy. Please advise.

## 2019-05-28 NOTE — Telephone Encounter (Signed)
Patient has stopped taking the Losartan for about 1 week. He states that when he first started taking it, he stopped it two days later. States that his BP has been normal, couldn't give me a specific number. He is scheduled to be seen on 06/05/19.

## 2019-06-04 NOTE — Progress Notes (Signed)
Follow-Up Visit  64 y.o.male presents for 3 month follow up for DM 2, chol, HTN.  His blood pressure has been controlled at home, he could not tolerate losartan 50, today their BP is BP: 128/76  He does not workout. He denies chest pain, shortness of breath, dizziness.  He is on cholesterol medication, zetia added last visit and denies myalgias. His cholesterol is not at goal. The cholesterol last visit was:   Lab Results  Component Value Date   CHOL 146 01/06/2019   HDL 32 (L) 01/06/2019   LDLCALC 89 01/06/2019   TRIG 149 01/06/2019   CHOLHDL 4.6 01/06/2019   Patient is on Vitamin D supplement.   Lab Results  Component Value Date   VD25OH 16 (L) 01/06/2019      He has Diabetes Mellitus type 2:  He had negative glutamic acid, C peptide and insulin 12/2018 with CKD he is NOT on ACE/ARB- was put on losartan last visit but states he had SOB and muscle aches that.  With hyperlipidemia on pravastatin 40 mg and NOT at goal so zetia was added- he could not tolerate lipitor.  He was restarted metformin last visit.   denies  paresthesia of the feet, polydipsia, polyuria and visual disturbances  Last A1C was:  Last hemoglobin A1c was: Lab Results  Component Value Date   HGBA1C 7.8 (H) 01/06/2019   HGBA1C 8.8 (H) 08/27/2018   HGBA1C 8.3 (H) 07/30/2017   Lab Results  Component Value Date   GFRNONAA 76 01/06/2019   Lab Results  Component Value Date   CHOL 146 01/06/2019   HDL 32 (L) 01/06/2019   LDLCALC 89 01/06/2019   TRIG 149 01/06/2019   CHOLHDL 4.6 01/06/2019    Body mass index is 31.11 kg/m.  Blood pressure 128/76, pulse 71, temperature 97.6 F (36.4 C), weight 232 lb 9.6 oz (105.5 kg), SpO2 97 %.   Lab Results  Component Value Date   MICROALBUR 1.0 08/27/2018     Problem List has Hyperlipidemia associated with type 2 diabetes mellitus ([place]); Hypertension; Type 2 diabetes mellitus ([place]); GERD (gastroesophageal reflux disease); Vitamin D deficiency; Testosterone  deficiency; Obesity (BMI 30.0-34.9); Medication management; Benign prostatic hyperplasia with urinary frequency; CKD stage 2 due to type 2 diabetes mellitus ([place]); Snoring; and B12 deficiency on their problem list.  Medications Current Outpatient Medications on File Prior to Visit  Medication Sig  . aspirin 81 MG chewable tablet Chew by mouth daily.  . Cholecalciferol 1000 units capsule Take 5,000 Units by mouth daily.  . cyclobenzaprine (FLEXERIL) 5 MG tablet Take 1 tablet (5 mg total) by mouth 3 (three) times daily as needed for muscle spasms. Do not drive on med or take with alcohol. May cause drowsiness.  . ezetimibe (ZETIA) 10 MG tablet Take 1 tablet (10 mg total) by mouth daily.  Marland Kitchen losartan (COZAAR) 50 MG tablet Take 1/2- 1 tab daily by mouth for blood pressure goal <130/80.  Marland Kitchen metFORMIN (GLUCOPHAGE XR) 500 MG 24 hr tablet Take 2 tablets twice daily by mouth for diabetes.  . pravastatin (PRAVACHOL) 40 MG tablet (START) WITH  1/2 (ONE-HALF) TABLET BY MOUTH ONCE DAILY INCREASE TO 1 TABLET PENDING LABS REACTION  . tamsulosin (FLOMAX) 0.4 MG CAPS capsule Take 1 capsule (0.4 mg total) by mouth daily.  Marland Kitchen testosterone cypionate (DEPOTESTOSTERONE CYPIONATE) 200 MG/ML injection Inject 2 ml into muscle every 2 weeks  . zinc gluconate 50 MG tablet Take 50 mg by mouth daily.   Current Facility-Administered Medications on  File Prior to Visit  Medication  . testosterone cypionate (DEPOTESTOSTERONE CYPIONATE) injection 200 mg  . testosterone cypionate (DEPOTESTOSTERONE CYPIONATE) injection 200 mg    ROS- see HPI  Physical Exam: Blood pressure 128/76, pulse 71, temperature 97.6 F (36.4 C), weight 232 lb 9.6 oz (105.5 kg), SpO2 97 %. Body mass index is 31.11 kg/m. General Appearance: Well nourished, in no apparent distress. Eyes: PERRLA, EOMs, conjunctiva no swelling or erythema ENT/Mouth: Ext aud canals clear, TMs without erythema, bulging. No erythema, swelling, or exudate on post pharynx.   Tonsils not swollen or erythematous. Hearing normal.  Respiratory: Respiratory effort normal, BS equal bilaterally without rales, rhonchi, wheezing or stridor.  Cardio: RRR with no MRGs. Brisk peripheral pulses without edema.  Abdomen: Soft, + BS.  Non tender, no guarding, rebound, hernias, masses. Musculoskeletal: Full ROM, 5/5 strength, normal gait.  Skin: Warm, dry without rashes, lesions, ecchymosis.  Neuro: Cranial nerves intact. Normal muscle tone, no cerebellar symptoms. Sensation intact.    Plan and Assessment:  Hyperlipidemia associated with type 2 diabetes mellitus (HCC) -     TSH -     Lipid panel Goal is less than 70, continue pravastatin and zetia  Type 2 diabetes mellitus with stage 2 chronic kidney disease, without long-term current use of insulin (HCC) -     TSH -     Hemoglobin A1c Discussed general issues about diabetes pathophysiology and management., Educational material distributed., Suggested low cholesterol diet., Encouraged aerobic exercise., Discussed foot care., Reminded to get yearly retinal exam. See how he is doing on metformin alone, discussed adding SGLT2 or GLP for weight loss and better control and cardiovascular benefits.   B12 deficiency Need to get on medication, check next OV  Obesity (BMI 30.0-34.9) - follow up 3 months for progress monitoring - increase veggies, decrease carbs - long discussion about weight loss, diet, and exercise  Medication management -     CBC with Differential/Platelet -     COMPLETE METABOLIC PANEL WITH GFR -     Magnesium  Vitamin D deficiency -     VITAMIN D 25 Hydroxy (Vit-D Deficiency, Fractures)  Testosterone deficiency -     Testosterone  Encounter for screening for COVID-19 -     SAR CoV2 Serology (COVID 19)AB(IGG)IA    Future Appointments  Date Time Provider Department Center  09/01/2019 10:00 AM Judd Gaudier, NP [facility] None

## 2019-06-05 ENCOUNTER — Encounter: Payer: Self-pay | Admitting: Physician Assistant

## 2019-06-05 ENCOUNTER — Ambulatory Visit: Payer: BC Managed Care – PPO | Admitting: Physician Assistant

## 2019-06-05 ENCOUNTER — Other Ambulatory Visit: Payer: Self-pay

## 2019-06-05 VITALS — BP 128/76 | HR 71 | Temp 97.6°F | Wt 232.6 lb

## 2019-06-05 DIAGNOSIS — E1122 Type 2 diabetes mellitus with diabetic chronic kidney disease: Secondary | ICD-10-CM

## 2019-06-05 DIAGNOSIS — Z1152 Encounter for screening for COVID-19: Secondary | ICD-10-CM

## 2019-06-05 DIAGNOSIS — E669 Obesity, unspecified: Secondary | ICD-10-CM | POA: Diagnosis not present

## 2019-06-05 DIAGNOSIS — Z79899 Other long term (current) drug therapy: Secondary | ICD-10-CM

## 2019-06-05 DIAGNOSIS — E1169 Type 2 diabetes mellitus with other specified complication: Secondary | ICD-10-CM

## 2019-06-05 DIAGNOSIS — E785 Hyperlipidemia, unspecified: Secondary | ICD-10-CM | POA: Diagnosis not present

## 2019-06-05 DIAGNOSIS — E559 Vitamin D deficiency, unspecified: Secondary | ICD-10-CM | POA: Diagnosis not present

## 2019-06-05 DIAGNOSIS — E538 Deficiency of other specified B group vitamins: Secondary | ICD-10-CM

## 2019-06-05 DIAGNOSIS — E349 Endocrine disorder, unspecified: Secondary | ICD-10-CM | POA: Diagnosis not present

## 2019-06-05 DIAGNOSIS — N182 Chronic kidney disease, stage 2 (mild): Secondary | ICD-10-CM

## 2019-06-05 NOTE — Patient Instructions (Addendum)
Diabetes is a very complicated disease...lets simplify it.  An easy way to look at it to understand the complications is if you think of the extra sugar floating in your blood stream as glass shards floating through your blood stream.   Diet soda leads to weight gain.  We recently discovered that the artifical sugar in the soda stops an enzyme in your stomach that is suppose to signal that your brain is full. So patients that drink a lot of diet soda will never feel full and tend to over eat. So please cut back on diet soda and it can help with your weight loss.   A study found that two or more sodas a day increased the risk of hypertension, diabetes, stroke.   Another recent study found that higher consumption of soda and juice was associated with increased risk of cancer and breast cancer.   In animal studies artifical sweeteners affect the gut bacteria and may contribute to chronic disease this way.    Diabetes affects your small vessels first: 1) The glass shards (sugar) scraps down the tiny blood vessels in your eyes and lead to diabetic retinopathy, the leading cause of blindness in the [place]. Diabetes is the leading cause of newly diagnosed adult (29 to 65 years of age) blindness in the [place].  2) The glass shards scratches down the tiny vessels of your legs leading to nerve damage called neuropathy and can lead to amputations of your feet. More than 60% of all non-traumatic amputations of lower limbs occur in people with diabetes.  3) Over time the small vessels in your brain are shredded and closed off, individually this does not cause any problems but over a long period of time many of the small vessels being blocked can lead to Vascular Dementia.   4) Your kidney's are a filter system and have a "net" that keeps certain things in the body and lets bad things out. Sugar shreds this net and leads to kidney damage and eventually failure. Decreasing the sugar that is destroying the  net and certain blood pressure medications can help stop or decrease progression of kidney disease. Diabetes was the primary cause of kidney failure in 44 percent of all new cases in 2011.  5) Diabetes also destroys the small vessels in your penis that lead to erectile dysfunction. Eventually the vessels are so damaged that you may not be responsive to cialis or viagra.   Diabetes and your large vessels: Your larger vessels consist of your coronary arteries in your heart and the carotid vessels to your brain. Diabetes or even increased sugars put you at 300% increased risk of heart attack and stroke and this is why.. The sugar scrapes down your large blood vessels and your body sees this as an internal injury and tries to repair itself. Just like you get a scab on your skin, your platelets will stick to the blood vessel wall trying to heal it. This is why we have diabetics on low dose aspirin daily, this prevents the platelets from sticking and can prevent plaque formation. In addition, your body takes cholesterol and tries to shove it into the open wound. This is why we want your LDL, or bad cholesterol, below 70.   The combination of platelets and cholesterol over 5-10 years forms plaque that can break off and cause a heart attack or stroke.   PLEASE REMEMBER:  Diabetes is preventable! Up to 85 percent of complications and morbidities among individuals with type  2 diabetes can be prevented, delayed, or effectively treated and minimized with regular visits to a health professional, appropriate monitoring and medication, and a healthy diet and lifestyle.  We are starting you on Metformin to prevent or treat diabetes.  Metformin does NOT cause low blood sugars.   In order to create energy your cells need insulin and sugar but sometime your cells do not accept the insulin and this can cause increased sugars and decreased energy.   The Metformin helps your cells accept insulin and the sugar this  help: 1) increase your energy  2) weight loss.    The two most common side effects are nausea and diarrhea, follow these rules to avoid it but these symptoms get better with time on the medication.    ALSO You can take imodium per box instructions when starting metformin if needed.   Rules of metformin: 1) start out slow with only one pill daily. Our goal for you is 4 pills a day or 2000mg  total.  2) take with your largest meal. 3) Take with least amount of carbs.   Call if you have any problems.

## 2019-06-06 LAB — CBC WITH DIFFERENTIAL/PLATELET
Absolute Monocytes: 596 cells/uL (ref 200–950)
Basophils Absolute: 28 cells/uL (ref 0–200)
Basophils Relative: 0.4 %
Eosinophils Absolute: 178 cells/uL (ref 15–500)
Eosinophils Relative: 2.5 %
HCT: 45.7 % (ref 38.5–50.0)
Hemoglobin: 15 g/dL (ref 13.2–17.1)
Lymphs Abs: 1960 cells/uL (ref 850–3900)
MCH: 29.4 pg (ref 27.0–33.0)
MCHC: 32.8 g/dL (ref 32.0–36.0)
MCV: 89.6 fL (ref 80.0–100.0)
MPV: 11.2 fL (ref 7.5–12.5)
Monocytes Relative: 8.4 %
Neutro Abs: 4338 cells/uL (ref 1500–7800)
Neutrophils Relative %: 61.1 %
Platelets: 206 10*3/uL (ref 140–400)
RBC: 5.1 10*6/uL (ref 4.20–5.80)
RDW: 12.1 % (ref 11.0–15.0)
Total Lymphocyte: 27.6 %
WBC: 7.1 10*3/uL (ref 3.8–10.8)

## 2019-06-06 LAB — COMPLETE METABOLIC PANEL WITH GFR
AG Ratio: 1.9 (calc) (ref 1.0–2.5)
ALT: 20 U/L (ref 9–46)
AST: 18 U/L (ref 10–35)
Albumin: 4.4 g/dL (ref 3.6–5.1)
Alkaline phosphatase (APISO): 88 U/L (ref 35–144)
BUN: 16 mg/dL (ref 7–25)
CO2: 31 mmol/L (ref 20–32)
Calcium: 9.4 mg/dL (ref 8.6–10.3)
Chloride: 101 mmol/L (ref 98–110)
Creat: 1.15 mg/dL (ref 0.70–1.25)
GFR, Est African American: 78 mL/min/{1.73_m2} (ref >=60)
GFR, Est Non African American: 67 mL/min/{1.73_m2} (ref >=60)
Globulin: 2.3 g/dL (calc) (ref 1.9–3.7)
Glucose, Bld: 182 mg/dL — ABNORMAL HIGH (ref 65–99)
Potassium: 4.2 mmol/L (ref 3.5–5.3)
Sodium: 139 mmol/L (ref 135–146)
Total Bilirubin: 0.4 mg/dL (ref 0.2–1.2)
Total Protein: 6.7 g/dL (ref 6.1–8.1)

## 2019-06-06 LAB — SAR COV2 SEROLOGY (COVID19)AB(IGG),IA: SARS CoV2 AB IGG: NEGATIVE

## 2019-06-06 LAB — VITAMIN D 25 HYDROXY (VIT D DEFICIENCY, FRACTURES): Vit D, 25-Hydroxy: 14 ng/mL — ABNORMAL LOW (ref 30–100)

## 2019-06-06 LAB — LIPID PANEL
Cholesterol: 190 mg/dL (ref <200)
HDL: 34 mg/dL — ABNORMAL LOW (ref >=40)
LDL Cholesterol (Calc): 118 mg/dL (calc) — ABNORMAL HIGH
Non-HDL Cholesterol (Calc): 156 mg/dL (calc) — ABNORMAL HIGH (ref <130)
Total CHOL/HDL Ratio: 5.6 (calc) — ABNORMAL HIGH (ref <5.0)
Triglycerides: 266 mg/dL — ABNORMAL HIGH (ref <150)

## 2019-06-06 LAB — HEMOGLOBIN A1C
Hgb A1c MFr Bld: 7.9 % of total Hgb — ABNORMAL HIGH (ref <5.7)
Mean Plasma Glucose: 180 (calc)
eAG (mmol/L): 10 (calc)

## 2019-06-06 LAB — TSH: TSH: 1.51 mIU/L (ref 0.40–4.50)

## 2019-06-06 LAB — TESTOSTERONE: Testosterone: 940 ng/dL — ABNORMAL HIGH (ref 250–827)

## 2019-06-06 LAB — MAGNESIUM: Magnesium: 2.2 mg/dL (ref 1.5–2.5)

## 2019-06-13 ENCOUNTER — Other Ambulatory Visit: Payer: Self-pay | Admitting: Adult Health

## 2019-06-13 DIAGNOSIS — E349 Endocrine disorder, unspecified: Secondary | ICD-10-CM

## 2019-06-13 MED ORDER — OLMESARTAN MEDOXOMIL 20 MG PO TABS: ORAL_TABLET | ORAL | 3 refills | Status: DC

## 2019-06-13 MED ORDER — TESTOSTERONE CYPIONATE 200 MG/ML IM SOLN: INTRAMUSCULAR | 2 refills | Status: DC

## 2019-06-22 MED ORDER — TELMISARTAN 20 MG PO TABS: 20.0000 mg | ORAL_TABLET | Freq: Every day | ORAL | 1 refills | Status: DC

## 2019-06-22 NOTE — Addendum Note (Signed)
Addended by: Quentin Mulling R on: 06/22/2019 11:46 AM   Modules accepted: Orders

## 2019-06-24 ENCOUNTER — Ambulatory Visit (INDEPENDENT_AMBULATORY_CARE_PROVIDER_SITE_OTHER): Payer: BC Managed Care – PPO

## 2019-06-24 ENCOUNTER — Other Ambulatory Visit: Payer: Self-pay

## 2019-06-24 VITALS — BP 130/80 | HR 71 | Temp 96.8°F | Wt 229.0 lb

## 2019-06-24 DIAGNOSIS — E349 Endocrine disorder, unspecified: Secondary | ICD-10-CM

## 2019-06-24 MED ORDER — TESTOSTERONE CYPIONATE 200 MG/ML IM SOLN
200.0000 mg | Freq: Once | INTRAMUSCULAR | Status: AC
  Administered 2019-06-24: 10:00:00 200 mg via INTRAMUSCULAR

## 2019-06-24 NOTE — Progress Notes (Signed)
Patient is here for a NV for his Testosterone Cypionate 200 mg/ml injection 2 ml IM in RIGHT upper outer quadrant. Tolerated well and will return in 2 weeks for next injection. 

## 2019-07-18 ENCOUNTER — Other Ambulatory Visit: Payer: Self-pay

## 2019-07-18 ENCOUNTER — Ambulatory Visit (INDEPENDENT_AMBULATORY_CARE_PROVIDER_SITE_OTHER): Payer: BC Managed Care – PPO | Admitting: *Deleted

## 2019-07-18 VITALS — BP 126/82 | HR 76 | Temp 97.6°F | Resp 16 | Wt 227.6 lb

## 2019-07-18 DIAGNOSIS — E349 Endocrine disorder, unspecified: Secondary | ICD-10-CM

## 2019-07-18 MED ORDER — TESTOSTERONE CYPIONATE 200 MG/ML IM SOLN
400.0000 mg | Freq: Once | INTRAMUSCULAR | Status: AC
  Administered 2019-07-18: 400 mg via INTRAMUSCULAR

## 2019-07-18 NOTE — Progress Notes (Signed)
Patient here for a NV to receive Testosterone Cypionate 200 mg/ml injection 2 ml IM in left upper outer quadrant. Patient tolerated well.

## 2019-08-12 ENCOUNTER — Other Ambulatory Visit: Payer: Self-pay

## 2019-08-12 ENCOUNTER — Ambulatory Visit (INDEPENDENT_AMBULATORY_CARE_PROVIDER_SITE_OTHER): Payer: BC Managed Care – PPO

## 2019-08-12 VITALS — BP 122/72 | HR 77 | Temp 97.5°F | Wt 230.0 lb

## 2019-08-12 DIAGNOSIS — E349 Endocrine disorder, unspecified: Secondary | ICD-10-CM

## 2019-08-12 MED ORDER — TESTOSTERONE CYPIONATE 200 MG/ML IM SOLN
200.0000 mg | Freq: Once | INTRAMUSCULAR | Status: AC
  Administered 2019-08-12: 200 mg via INTRAMUSCULAR

## 2019-08-12 NOTE — Progress Notes (Signed)
Patient here for a NV to receive Testosterone Cypionate 200 mg/ml injection 2 ml IM inrightupper outer quadrant. Patient tolerated well 

## 2019-08-29 NOTE — Progress Notes (Deleted)
Complete Physical  Assessment and Plan: Encounter for general adult medical examination with abnormal findings 1 year  Essential hypertension -     CBC with Differential/Platelet -     COMPLETE METABOLIC PANEL WITH GFR -     TSH -     Urinalysis, Routine w reflex microscopic -     Microalbumin / creatinine urine ratio -     EKG 12-Lead -    - continue medications, DASH diet, exercise and monitor at home. Call if greater than 130/80.   Type 2 diabetes mellitus with stage 2 chronic kidney disease, without long-term current use of insulin (HCC) -     Hemoglobin A1c Discussed general issues about diabetes pathophysiology and management., Educational material distributed., Suggested low cholesterol diet., Encouraged aerobic exercise., Discussed foot care., Reminded to get yearly retinal exam. Diabetic foot exam completed *** GET EYE EXAM *** Off metformin, he will likely need ***  CKD stage 2 due to type 2 diabetes mellitus (HCC) Increase fluids, avoid NSAIDS, monitor sugars, will monitor -     COMPLETE METABOLIC PANEL WITH GFR -     Urinalysis, Routine w reflex microscopic -     Microalbumin / creatinine urine ratio  Mixed hyperlipidemia associated with T2DM (HCC) On pravastatin due to CK^ with rosuvastatin check lipids decrease fatty foods increase activity.  -     Lipid panel  Testosterone deficiency Continued perceived benefits with supplementation, no SE. Check level, continue zinc ***, no symptoms of elevated estrogen or dihydrotestosterone.  -     Testosterone -     testosterone cypionate (DEPOTESTOSTERONE CYPIONATE) injection 200 mg  Obesity (BMI 30.0-34.9) *** - follow up 3 months for progress monitoring - increase veggies, decrease carbs - long discussion about weight loss, diet, and exercise  Medication management -     Magnesium  Benign prostatic hyperplasia with urinary frequency Monitor -     PSA -     tamsulosin (FLOMAX) 0.4 MG CAPS capsule; Take 1  capsule (0.4 mg total) by mouth daily.  Snoring Monitor  Vitamin D deficiency -     VITAMIN D 25 Hydroxy (Vit-D Deficiency, Fractures)  Gastroesophageal reflux disease, esophagitis presence not specified Continue PPI/H2 blocker, diet discussed  B12 deficiency  -     Vitamin B12   Discussed med's effects and SE's. Screening labs and tests as requested with regular follow-up as recommended. Moderate- high complexity decision making was done during the course of this visit.  Future Appointments  Date Time Provider Department Center  09/01/2019 10:00 AM Judd Gaudier, NP [facility] None  08/31/2020 10:00 AM Judd Gaudier, NP [facility] None    HPI 64 y.o. male patient presents for complete physical.  He has Hyperlipidemia associated with type 2 diabetes mellitus (HCC); Hypertension; Type 2 diabetes mellitus (HCC); GERD (gastroesophageal reflux disease); Vitamin D deficiency; Testosterone deficiency; Obesity (BMI 30.0-34.9); Medication management; Benign prostatic hyperplasia with urinary frequency; CKD stage 2 due to type 2 diabetes mellitus (HCC); Snoring; and B12 deficiency on their problem list.   He ***  Snoring ***  Patient's blood pressure has been controlled at home, has not taken BP at home. Patient denies chest pain, shortness of breath, dizziness.     BMI is There is no height or weight on file to calculate BMI., he is working on diet and exercise. Wt Readings from Last 3 Encounters:  08/12/19 230 lb (104.3 kg)  07/18/19 227 lb 9.6 oz (103.2 kg)  06/24/19 229 lb (103.9 kg)   His  blood pressure {HAS HAS NOT:18834} been controlled at home, today their BP is    He {DOES_DOES GUR:42706} workout. He denies chest pain, shortness of breath, dizziness.  Patient is on cholesterol medication (pravastatin ***) and denies myalgias, does have history of elevated CPK in the past with rosuvastatin.  The patient's cholesterol last visit was: Lab Results  Component Value Date   CHOL  190 06/05/2019   HDL 34 (L) 06/05/2019   LDLCALC 118 (H) 06/05/2019   TRIG 266 (H) 06/05/2019   CHOLHDL 5.6 (H) 06/05/2019   He is not on vitamin D Lab Results  Component Value Date   VD25OH 14 (L) 06/05/2019   He has been working on diet and exercise for Diabetes  CKD on ACE Hyperlipidemia on pravastatin  Stress test 2007 normal Eye exam NEVER ***  he is on bASA On metformin HAS NOT TAKEN IT HE DOES NOT CHECK HIS SUGARS AT HOME and denies paresthesia of the feet, polydipsia, polyuria and visual disturbances.  Last A1C was:  Lab Results  Component Value Date   HGBA1C 7.9 (H) 06/05/2019   He has CKD II associated with T2DM monitored at this office:  Lab Results  Component Value Date   [facility] 67 06/05/2019   He has a history of testosterone deficiency and is on testosterone replacement.  Taking *** and last shot was *** He states that the testosterone helps with his energy, libido, muscle mass.  Denies symptoms of elevated estrogen or dihydrotestosterone.  Lab Results  Component Value Date   TESTOSTERONE 940 (H) 06/05/2019   Has BPH with urinary sx (has frequent urination, on flomax) Last 3 PSA were:  Lab Results  Component Value Date   PSA 2.3 08/27/2018   PSA 1.4 10/04/2016   PSA 1.51 08/09/2015  Denies ***   Hx of low B12 was advised OTC supplement after last visit *** Lab Results  Component Value Date   VITAMINB12 309 08/27/2018     Current Medications:  Current Outpatient Medications on File Prior to Visit  Medication Sig Dispense Refill  . aspirin 81 MG chewable tablet Chew by mouth daily.    . Cholecalciferol 1000 units capsule Take 5,000 Units by mouth daily.    . cyclobenzaprine (FLEXERIL) 5 MG tablet Take 1 tablet (5 mg total) by mouth 3 (three) times daily as needed for muscle spasms. Do not drive on med or take with alcohol. May cause drowsiness. 60 tablet 0  . ezetimibe (ZETIA) 10 MG tablet Take 1 tablet (10 mg total) by mouth daily. 90 tablet  1  . metFORMIN (GLUCOPHAGE XR) 500 MG 24 hr tablet Take 2 tablets twice daily by mouth for diabetes. 360 tablet 1  . pravastatin (PRAVACHOL) 40 MG tablet Take 1 tablet at Bedtime Daily for Cholesterol 90 tablet 0  . tamsulosin (FLOMAX) 0.4 MG CAPS capsule Take 1 capsule (0.4 mg total) by mouth daily. 90 capsule 1  . telmisartan (MICARDIS) 20 MG tablet Take 1 tablet (20 mg total) by mouth daily. 30 tablet 1  . testosterone cypionate (DEPOTESTOSTERONE CYPIONATE) 200 MG/ML injection Inject 2 ml into muscle every 2 weeks 10 mL 2  . zinc gluconate 50 MG tablet Take 50 mg by mouth daily.     No current facility-administered medications on file prior to visit.   Health Maintenance:   Health Maintenance  Topic Date Due  . OPHTHALMOLOGY EXAM  Never done  . COVID-19 Vaccine (1) Never done  . FOOT EXAM  10/04/2017  . INFLUENZA  VACCINE  10/26/2019  . COLONOSCOPY  11/05/2019  . HEMOGLOBIN A1C  12/06/2019  . TETANUS/TDAP  08/08/2025  . Hepatitis C Screening  Completed  . HIV Screening  Completed   Immunization History  Administered Date(s) Administered  . Td 03/27/2005  . Tdap 08/09/2015   Tetanus: 2017 Pneumovax: 2013 Prevnar 13: due age 60 Flu vaccine: N/A *** Zostavax:N/A Covid 19: ***  DEXA: N/A Colonoscopy: 10/2009 due 10 years *** EGD:N/A Ct chest 2005 CXR 10/2017 Stress test 08/2005  *** needs foot exam   Last eye exam: *** needs diabetes eye Last dental exam:  Last derm exam:   Medical History:  Past Medical History:  Diagnosis Date  . GERD (gastroesophageal reflux disease)   . Hyperlipidemia   . Hypertension   . Other testicular hypofunction   . Prediabetes   . Vitamin D deficiency    Allergies Allergies  Allergen Reactions  . Codeine     REACTION: Anxiety    SURGICAL HISTORY He  has a past surgical history that includes Vasectomy (01/2004) and Urethral dilation (2007). FAMILY HISTORY His family history includes COPD in his father; Heart disease in his  father; Hyperlipidemia in his mother; Hypertension in his father and mother. SOCIAL HISTORY He  reports that he has never smoked. He has never used smokeless tobacco. He reports that he does not drink alcohol or use drugs.  Review of Systems  Constitutional: Negative.   HENT: Negative for congestion, ear discharge, ear pain, hearing loss, nosebleeds, sore throat and tinnitus.   Eyes: Negative for blurred vision, double vision, photophobia, pain, discharge and redness.  Respiratory: Negative.  Negative for stridor.   Cardiovascular: Negative.  Negative for chest pain.  Gastrointestinal: Negative.   Genitourinary: Negative for dysuria, flank pain, frequency, hematuria and urgency.  Musculoskeletal: Negative.   Skin: Negative.   Neurological: Negative.  Negative for headaches.  Psychiatric/Behavioral: Negative.  Negative for depression. The patient is not nervous/anxious.    Physical Exam: Estimated body mass index is 30.76 kg/m as calculated from the following:   Height as of 02/24/19: 6' 0.5" (1.842 m).   Weight as of 08/12/19: 230 lb (104.3 kg). There were no vitals filed for this visit. General Appearance: Well nourished, in no apparent distress. Eyes: PERRLA, EOMs, conjunctiva no swelling or erythema Sinuses: No Frontal/maxillary tenderness ENT/Mouth: Ext aud canals clear, normal light reflex with TMs without erythema, bulging. Good dentition. No erythema, swelling, or exudate on post pharynx. Tonsils not swollen or erythematous. Hearing normal.  Neck: Supple, thyroid normal. No bruits Respiratory: Respiratory effort normal, BS equal bilaterally without rales, rhonchi, wheezing or stridor. Cardio: RRR without murmurs, rubs or gallops. Brisk peripheral pulses without edema.  Chest: symmetric, with normal excursions and percussion. Abdomen: Soft, +BS. Non tender, no guarding, rebound, hernias, masses, or organomegaly. .  Lymphatics: Non tender without lymphadenopathy.  Genitourinary:  defer did last visit, enlarged. *** Musculoskeletal: Full ROM all peripheral extremities,5/5 strength, and normal gait. Skin: Warm, dry without rashes, lesions, ecchymosis.  Neuro: Cranial nerves intact, reflexes equal bilaterally. Normal muscle tone, no cerebellar symptoms. Sensation intact.  Psych: Awake and oriented X 3, normal affect, Insight and Judgment appropriate.   EKG: WNL no changes. *** Aorta scan: DEFER  Carlyon Shadow Romelle Reiley 1:24 PM

## 2019-09-01 ENCOUNTER — Encounter: Payer: BC Managed Care – PPO | Admitting: Adult Health

## 2019-09-09 IMAGING — DX DG CHEST 2V
2 series · 2 of 2 positions shown · non-contrast
Comparison: 01/23/2006

CLINICAL DATA: Central chest pain radiating to the back and right
jaw since this morning.

EXAM:
CHEST - 2 VIEW

[chest pa]
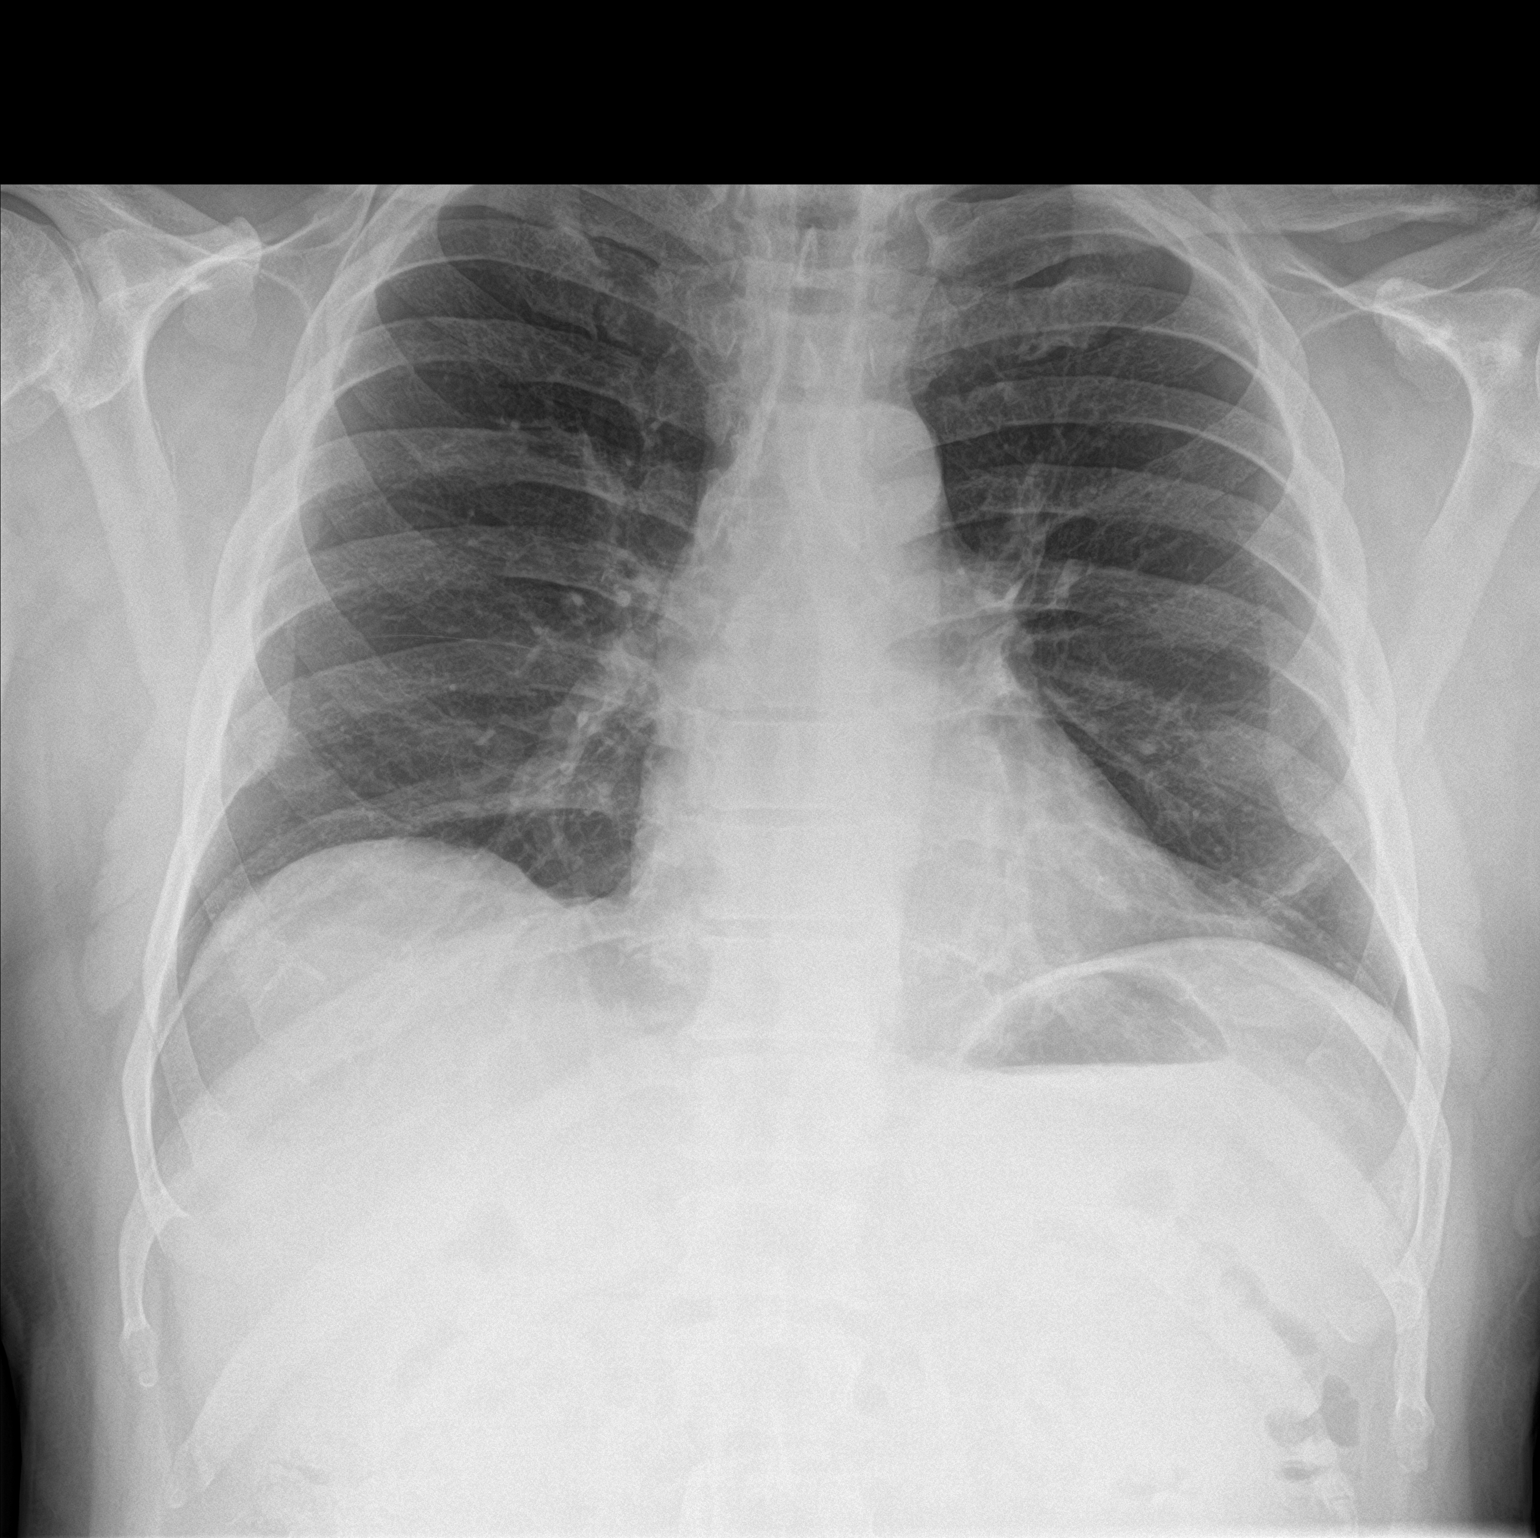

[chest lat]
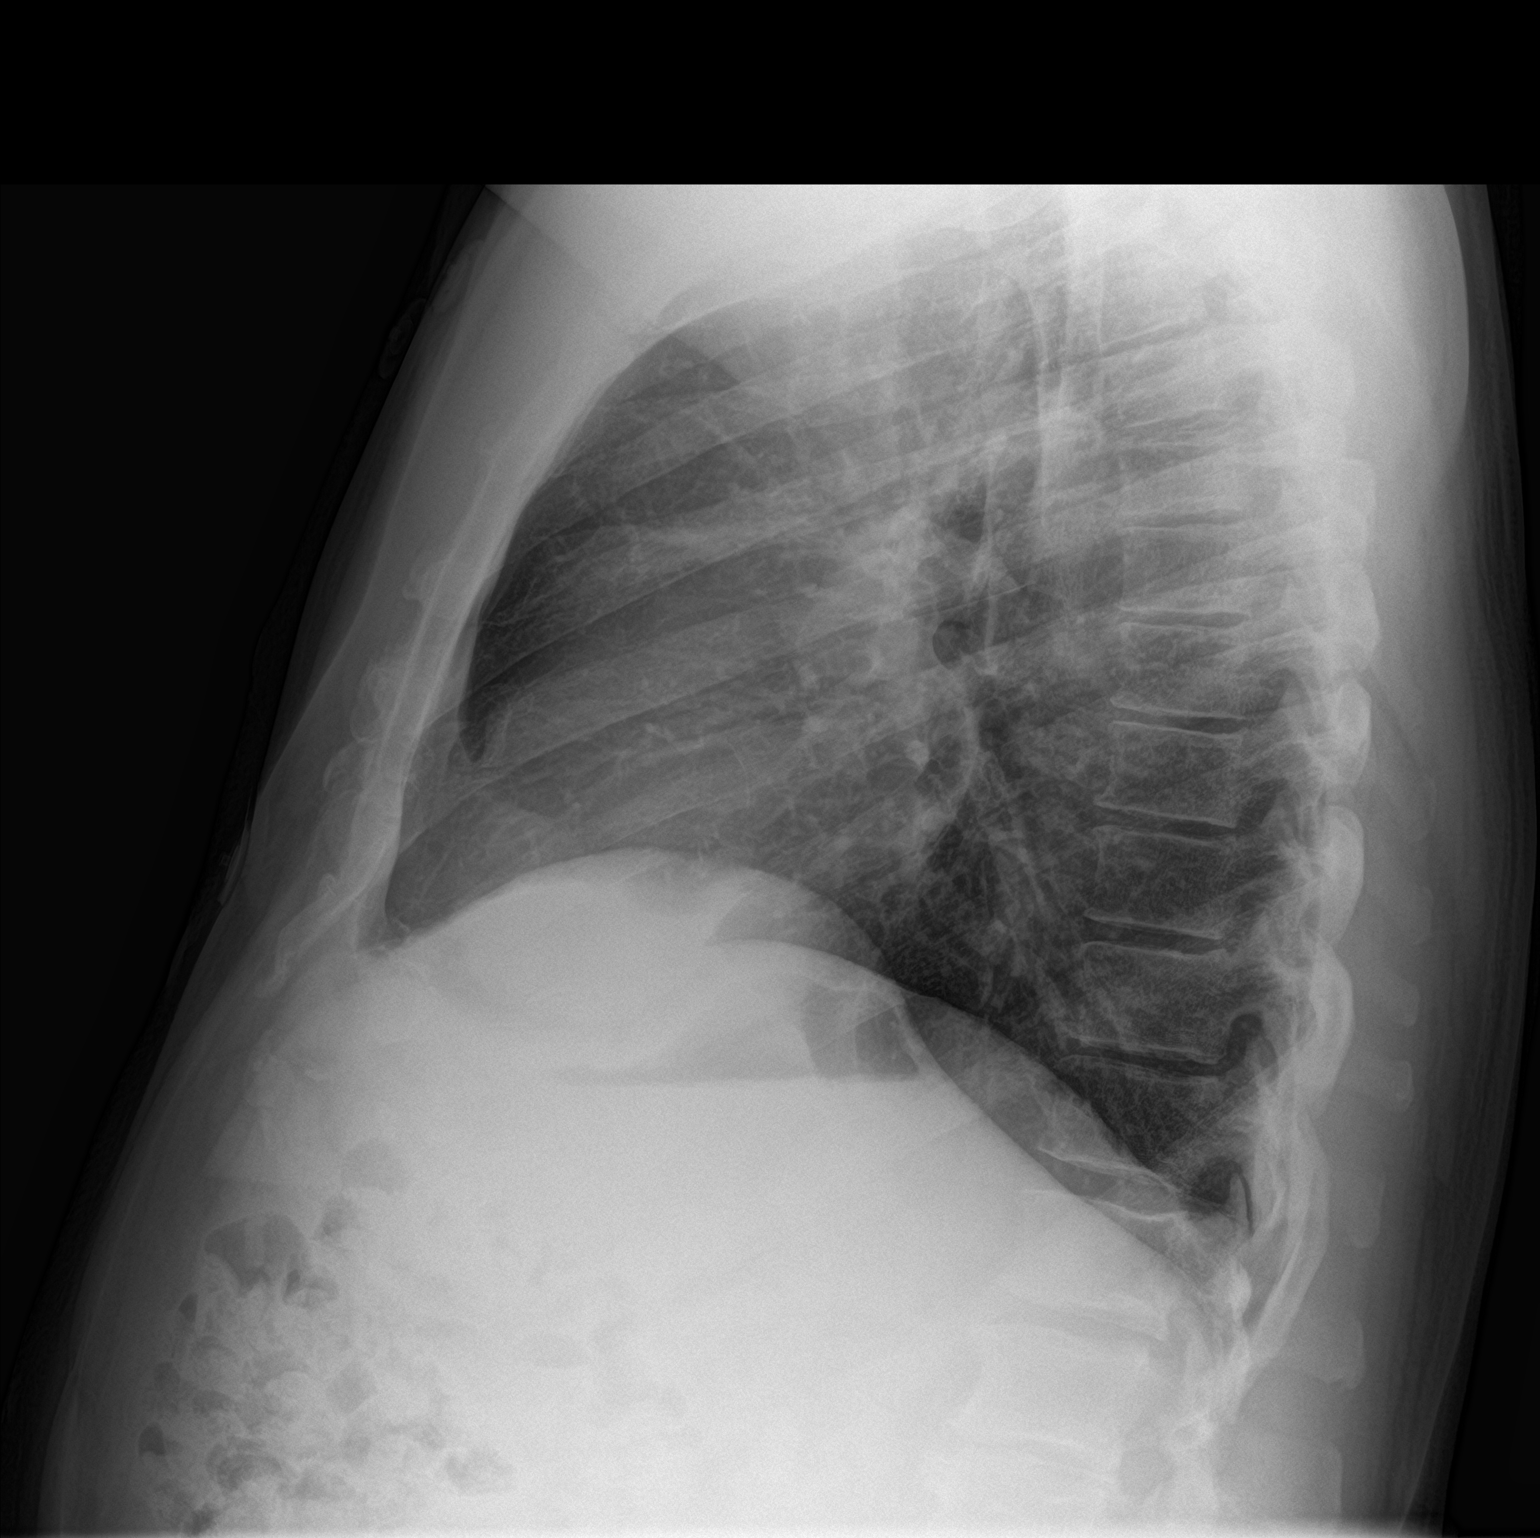

[2 of 2 positions shown; findings below may reference images not displayed]

FINDINGS: The heart size and mediastinal contours are within normal limits.
Both lungs are clear. No pleural effusion or pneumothorax. The
visualized skeletal structures are unremarkable.
IMPRESSION: No active cardiopulmonary disease.

## 2019-09-10 ENCOUNTER — Other Ambulatory Visit: Payer: Self-pay | Admitting: Physician Assistant

## 2019-09-17 NOTE — Progress Notes (Signed)
Complete Physical  Assessment and Plan:  Encounter for general adult medical examination with abnormal findings 1 year  Essential hypertension -     CBC with Differential/Platelet -     COMPLETE METABOLIC PANEL WITH GFR -     TSH -     Urinalysis, Routine w reflex microscopic -     Microalbumin / creatinine urine ratio -     EKG 12-Lead -    - continue medications, DASH diet, exercise and monitor at home. Call if greater than 130/80.   Type 2 diabetes mellitus with stage 2 chronic kidney disease, without long-term current use of insulin (HCC) -     Hemoglobin A1c Discussed general issues about diabetes pathophysiology and management., Educational material distributed., Suggested low cholesterol diet., Encouraged aerobic exercise., Discussed foot care., Reminded to get yearly retinal exam. Diabetic foot exam completed GET EYE EXAM - patient states will schedule Back on metformin, tolerating 500 mg BID Has glucometer - advised needs to check at least weekly  CKD stage 2 due to type 2 diabetes mellitus (HCC) Increase fluids, avoid NSAIDS, monitor sugars, will monitor -     COMPLETE METABOLIC PANEL WITH GFR -     Urinalysis, Routine w reflex microscopic -     Microalbumin / creatinine urine ratio  Mixed hyperlipidemia associated with T2DM (HCC) On pravastatin due to CK^ with rosuvastatin, continue zetia for LDL goal <70 if needed check lipids decrease fatty foods increase activity.  -     Lipid panel  Testosterone deficiency Continued perceived benefits with supplementation, no SE. Check level, recommend zinc 50 mg daily, no symptoms of elevated estrogen or dihydrotestosterone.  -     Testosterone -     testosterone cypionate (DEPOTESTOSTERONE CYPIONATE) injection 200 mg  Obesity (BMI 30.0-34.9) - follow up 3 months for progress monitoring - increase veggies, decrease carbs - long discussion about weight loss, diet, and exercise  Medication management -      Magnesium  Benign prostatic hyperplasia with urinary frequency Monitor -     PSA -     tamsulosin (FLOMAX) 0.4 MG CAPS capsule; Take 1 capsule (0.4 mg total) by mouth daily.  Vitamin D deficiency -     VITAMIN D 25 Hydroxy (Vit-D Deficiency, Fractures)  Gastroesophageal reflux disease, esophagitis presence not specified Continue PPI/H2 blocker, diet discussed  B12 deficiency  -     Vitamin B12   Discussed med's effects and SE's. Screening labs and tests as requested with regular follow-up as recommended. Moderate- high complexity decision making was done during the course of this visit.  Future Appointments  Date Time Provider Department Center  09/17/2020  9:30 AM Judd Gaudier, NP [facility] None    HPI 64 y.o. male patient presents for complete physical.  He has Hyperlipidemia associated with type 2 diabetes mellitus (HCC); Hypertension; Type 2 diabetes mellitus (HCC); GERD (gastroesophageal reflux disease); Vitamin D deficiency; Testosterone deficiency; Obesity (BMI 30.0-34.9); Medication management; Benign prostatic hyperplasia with urinary frequency; CKD stage 2 due to type 2 diabetes mellitus (HCC); Snoring; and B12 deficiency on their problem list.   He is married, 3 kids, 3 grandkids - lives next door and sees frequently, ages 16-12.  Retired Runner, broadcasting/film/video (high school), now owns [employer], semi retired.   BMI is Body mass index is 30.92 kg/m., he is working on diet and exercise. Gym daily - weight lifting  He watches diet Drinks crystal lite tea - admits only drinking 20 fluid ounces or so No alcohol,  Wt Readings from Last 3 Encounters:  09/18/19 228 lb (103.4 kg)  08/12/19 230 lb (104.3 kg)  07/18/19 227 lb 9.6 oz (103.2 kg)   His blood pressure has been controlled at home, today their BP is BP: 130/82  He does workout. He denies chest pain, shortness of breath, dizziness.  Patient is on cholesterol medication (pravastatin 40 mg daily new since last visit, zetia  10 mg daily - has been out) and denies myalgias, does have history of elevated CPK in the past with rosuvastatin.  The patient's cholesterol last visit was: Lab Results  Component Value Date   CHOL 190 06/05/2019   HDL 34 (L) 06/05/2019   LDLCALC 118 (H) 06/05/2019   TRIG 266 (H) 06/05/2019   CHOLHDL 5.6 (H) 06/05/2019   He is on vitamin D, takes 5000 IU a few days a week  Lab Results  Component Value Date   VD25OH 14 (L) 06/05/2019   He has been working on diet and exercise for Diabetes  CKD on ACE Hyperlipidemia on pravastatin  Stress test 2007 normal Eye exam NEVER   he is on bASA On metformin - takes 500 mg BID, can't tolerate more - GI sx HE DOES NOT CHECK HIS SUGARS AT HOME, does have glucometer and denies paresthesia of the feet, polydipsia, polyuria and visual disturbances.  Last A1C was:  Lab Results  Component Value Date   HGBA1C 7.9 (H) 06/05/2019   He has CKD II associated with T2DM monitored at this office:  Lab Results  Component Value Date   [facility] 67 06/05/2019   He has a history of testosterone deficiency and is on testosterone replacement.  Taking 400 mg every 2 weeks, last was 08/12/2019 He states that the testosterone helps with his energy, libido, muscle mass.  Denies symptoms of elevated estrogen or dihydrotestosterone.  Lab Results  Component Value Date   TESTOSTERONE 940 (H) 06/05/2019   Has BPH with urinary sx (has frequent urination, takes flomax PRN, gets up twice in the evening, denies slow stream, straining, split stream, dribbling) Last 3 PSA were:  Lab Results  Component Value Date   PSA 2.3 08/27/2018   PSA 1.4 10/04/2016   PSA 1.51 08/09/2015   Hx of low B12 was advised OTC supplement after last visit  Lab Results  Component Value Date   VITAMINB12 309 08/27/2018     Current Medications:  Current Outpatient Medications on File Prior to Visit  Medication Sig Dispense Refill  . aspirin 81 MG chewable tablet Chew by mouth  daily.    . Cholecalciferol 1000 units capsule Take 5,000 Units by mouth daily.    Marland Kitchen ezetimibe (ZETIA) 10 MG tablet Take 1 tablet (10 mg total) by mouth daily. 90 tablet 1  . metFORMIN (GLUCOPHAGE XR) 500 MG 24 hr tablet Take 2 tablets twice daily by mouth for diabetes. 360 tablet 1  . pravastatin (PRAVACHOL) 40 MG tablet Take 1 tablet at Bedtime Daily for Cholesterol 90 tablet 0  . tamsulosin (FLOMAX) 0.4 MG CAPS capsule Take 1 capsule (0.4 mg total) by mouth daily. 90 capsule 1  . telmisartan (MICARDIS) 20 MG tablet Take 1 tablet Daily for BP 90 tablet 0  . testosterone cypionate (DEPOTESTOSTERONE CYPIONATE) 200 MG/ML injection Inject 2 ml into muscle every 2 weeks 10 mL 2  . zinc gluconate 50 MG tablet Take 50 mg by mouth daily. (Patient not taking: Reported on 09/18/2019)     No current facility-administered medications on file prior to visit.  Health Maintenance:   Health Maintenance  Topic Date Due  . OPHTHALMOLOGY EXAM  Never done  . COVID-19 Vaccine (1) Never done  . FOOT EXAM  10/04/2017  . INFLUENZA VACCINE  10/26/2019  . COLONOSCOPY  11/05/2019  . HEMOGLOBIN A1C  12/06/2019  . TETANUS/TDAP  08/08/2025  . Hepatitis C Screening  Completed  . HIV Screening  Completed   Immunization History  Administered Date(s) Administered  . Td 03/27/2005  . Tdap 08/09/2015   Tetanus: 2017 Pneumovax: 2013 Prevnar 13: due age 89 Flu vaccine: n/a Zostavax: declines Covid 19: declines  DEXA: N/A Colonoscopy: 10/2009 due 10 years -patient will reach out, declines referral back  EGD:N/A Ct chest 2005 CXR 10/2017 Stress test 08/2005  Last eye exam: never, patient states will schedule Last dental exam: Dr. Judeth Horn, last visit 2019  Medical History:  Past Medical History:  Diagnosis Date  . GERD (gastroesophageal reflux disease)   . Hyperlipidemia   . Hypertension   . Other testicular hypofunction   . Prediabetes   . Vitamin D deficiency    Allergies Allergies  Allergen  Reactions  . Codeine     REACTION: Anxiety    SURGICAL HISTORY He  has a past surgical history that includes Vasectomy (01/2004) and Urethral dilation (2007). FAMILY HISTORY His family history includes COPD in his father; Heart disease in his father; Hyperlipidemia in his mother; Hypertension in his father and mother. SOCIAL HISTORY He  reports that he has never smoked. He has never used smokeless tobacco. He reports that he does not drink alcohol and does not use drugs.  Review of Systems  Constitutional: Negative for chills, diaphoresis, fever, malaise/fatigue and weight loss.  HENT: Negative for congestion, ear discharge, ear pain, hearing loss, nosebleeds, sore throat and tinnitus.   Eyes: Negative for blurred vision, double vision, photophobia, pain, discharge and redness.  Respiratory: Negative.  Negative for cough, sputum production, shortness of breath, wheezing and stridor.   Cardiovascular: Negative.  Negative for chest pain, palpitations, orthopnea, claudication, leg swelling and PND.  Gastrointestinal: Negative.  Negative for abdominal pain, blood in stool, constipation, diarrhea, heartburn, melena, nausea and vomiting.  Genitourinary: Negative.  Negative for dysuria, flank pain, frequency, hematuria and urgency.  Musculoskeletal: Negative.  Negative for falls, joint pain and myalgias.  Skin: Negative.  Negative for rash.  Neurological: Negative.  Negative for dizziness, tingling, sensory change, weakness and headaches.  Endo/Heme/Allergies: Negative for polydipsia.  Psychiatric/Behavioral: Negative.  Negative for depression, memory loss, substance abuse and suicidal ideas. The patient is not nervous/anxious and does not have insomnia.   All other systems reviewed and are negative.  Physical Exam: Estimated body mass index is 30.92 kg/m as calculated from the following:   Height as of this encounter: 6' (1.829 m).   Weight as of this encounter: 228 lb (103.4 kg). Vitals:    09/18/19 0859  BP: 130/82  Pulse: 73  Temp: (!) 97.3 F (36.3 C)  SpO2: 96%   General Appearance: Well nourished, in no apparent distress. Eyes: PERRLA, EOMs, conjunctiva no swelling or erythema Sinuses: No Frontal/maxillary tenderness ENT/Mouth: Ext aud canals clear, normal light reflex with TMs without erythema, bulging. Fair dentition. No erythema, swelling, or exudate on post pharynx. Tonsils not swollen or erythematous. Hearing normal.  Neck: Supple, thyroid normal. No bruits Respiratory: Respiratory effort normal, BS equal bilaterally without rales, rhonchi, wheezing or stridor. Cardio: RRR without murmurs, rubs or gallops. Brisk peripheral pulses without edema.  Chest: symmetric, with normal excursions and percussion.  Abdomen: Soft, +BS. Non tender, no guarding, rebound, hernias, masses, or organomegaly.  Lymphatics: Non tender without lymphadenopathy.  Genitourinary: defer, no concerns, shared decision making Musculoskeletal: Full ROM all peripheral extremities,5/5 strength, and normal gait. Skin: Warm, dry without rashes, lesions, ecchymosis.  Neuro: Cranial nerves intact, reflexes equal bilaterally. Normal muscle tone, no cerebellar symptoms. Sensation intact bil feet monofilament Psych: Awake and oriented X 3, normal affect, Insight and Judgment appropriate.   EKG: Sinus brady, NSCPT  Aorta scan: DEFER  Dan Maker 9:16 AM

## 2019-09-18 ENCOUNTER — Other Ambulatory Visit: Payer: Self-pay

## 2019-09-18 ENCOUNTER — Encounter: Payer: Self-pay | Admitting: Adult Health

## 2019-09-18 ENCOUNTER — Ambulatory Visit (INDEPENDENT_AMBULATORY_CARE_PROVIDER_SITE_OTHER): Payer: BC Managed Care – PPO | Admitting: Adult Health

## 2019-09-18 VITALS — BP 130/82 | HR 73 | Temp 97.3°F | Ht 72.0 in | Wt 228.0 lb

## 2019-09-18 DIAGNOSIS — Z125 Encounter for screening for malignant neoplasm of prostate: Secondary | ICD-10-CM

## 2019-09-18 DIAGNOSIS — Z13220 Encounter for screening for lipoid disorders: Secondary | ICD-10-CM | POA: Diagnosis not present

## 2019-09-18 DIAGNOSIS — Z130 Encounter for screening for diseases of the blood and blood-forming organs and certain disorders involving the immune mechanism: Secondary | ICD-10-CM | POA: Diagnosis not present

## 2019-09-18 DIAGNOSIS — K219 Gastro-esophageal reflux disease without esophagitis: Secondary | ICD-10-CM

## 2019-09-18 DIAGNOSIS — Z136 Encounter for screening for cardiovascular disorders: Secondary | ICD-10-CM | POA: Diagnosis not present

## 2019-09-18 DIAGNOSIS — Z0001 Encounter for general adult medical examination with abnormal findings: Secondary | ICD-10-CM

## 2019-09-18 DIAGNOSIS — Z1329 Encounter for screening for other suspected endocrine disorder: Secondary | ICD-10-CM

## 2019-09-18 DIAGNOSIS — E669 Obesity, unspecified: Secondary | ICD-10-CM

## 2019-09-18 DIAGNOSIS — I10 Essential (primary) hypertension: Secondary | ICD-10-CM

## 2019-09-18 DIAGNOSIS — Z131 Encounter for screening for diabetes mellitus: Secondary | ICD-10-CM | POA: Diagnosis not present

## 2019-09-18 DIAGNOSIS — E349 Endocrine disorder, unspecified: Secondary | ICD-10-CM

## 2019-09-18 DIAGNOSIS — Z1389 Encounter for screening for other disorder: Secondary | ICD-10-CM

## 2019-09-18 DIAGNOSIS — R350 Frequency of micturition: Secondary | ICD-10-CM

## 2019-09-18 DIAGNOSIS — N182 Chronic kidney disease, stage 2 (mild): Secondary | ICD-10-CM

## 2019-09-18 DIAGNOSIS — N401 Enlarged prostate with lower urinary tract symptoms: Secondary | ICD-10-CM | POA: Diagnosis not present

## 2019-09-18 DIAGNOSIS — E559 Vitamin D deficiency, unspecified: Secondary | ICD-10-CM

## 2019-09-18 DIAGNOSIS — Z0000 Encounter for general adult medical examination without abnormal findings: Secondary | ICD-10-CM | POA: Diagnosis not present

## 2019-09-18 DIAGNOSIS — E538 Deficiency of other specified B group vitamins: Secondary | ICD-10-CM

## 2019-09-18 DIAGNOSIS — Z79899 Other long term (current) drug therapy: Secondary | ICD-10-CM | POA: Diagnosis not present

## 2019-09-18 DIAGNOSIS — E785 Hyperlipidemia, unspecified: Secondary | ICD-10-CM

## 2019-09-18 MED ORDER — EZETIMIBE 10 MG PO TABS: 10.0000 mg | ORAL_TABLET | Freq: Every day | ORAL | 1 refills | Status: DC

## 2019-09-18 MED ORDER — TESTOSTERONE CYPIONATE 200 MG/ML IM SOLN
200.0000 mg | Freq: Once | INTRAMUSCULAR | Status: AC
  Administered 2019-09-18: 200 mg via INTRAMUSCULAR

## 2019-09-18 NOTE — Progress Notes (Signed)
Patient here for a NV to receive Testosterone Cypionate 200 mg/ml injection 2 ml IM in left upper outer quadrant. Patient tolerated well. 

## 2019-09-18 NOTE — Patient Instructions (Addendum)
  Mark [name] , Thank you for taking time to come for your Annual Wellness Visit. I appreciate your ongoing commitment to your health goals. Please review the following plan we discussed and let me know if I can assist you in the future.   These are the goals we discussed: Goals    . Blood Pressure < 130/80    . DIET - INCREASE WATER INTAKE     65-80+ fluid ounces daily to help maintain kidney functions    . Fasting Blood Glucose <130    . HEMOGLOBIN A1C < 7       This is a list of the screening recommended for you and due dates:  Health Maintenance  Topic Date Due  . Eye exam for diabetics  Never done  . COVID-19 Vaccine (1) 10/04/2019*  . Flu Shot  10/26/2019  . Colon Cancer Screening  11/05/2019  . Hemoglobin A1C  12/06/2019  . Complete foot exam   09/17/2020  . Tetanus Vaccine  08/08/2025  .  Hepatitis C: One time screening is recommended by Center for Disease Control  (CDC) for  adults born from [birth year] through 1966.   Completed  . HIV Screening  Completed  *Topic was postponed. The date shown is not the original due date.    Please schedule overdue diabetes eye exam. Guidelines recommend getting this done annually for diabetics to catch early eye problems. Eye doctors that you can call, they are all very close to our office, or can check with insurance   Dr. Emily Filbert [phone] Dr. Hazle Quant [phone] Dr. Elmer Picker [phone]    Know what a healthy weight is for you (roughly BMI <25) and aim to maintain this  Aim for 7+ servings of fruits and vegetables daily  65-80+ fluid ounces of water or unsweet tea for healthy kidneys  Limit to max 1 drink of alcohol per day; avoid smoking/tobacco  Limit animal fats in diet for cholesterol and heart health - choose grass fed whenever available  Avoid highly processed foods, and foods high in saturated/trans fats  Aim for low stress - take time to unwind and care for your mental health  Aim for 150 min of moderate intensity exercise  weekly for heart health, and weights twice weekly for bone health  Aim for 7-9 hours of sleep daily    A great goal to work towards is aiming to get in a serving daily of some of the most nutritionally dense foods - G- BOMBS daily

## 2019-09-19 ENCOUNTER — Other Ambulatory Visit: Payer: Self-pay | Admitting: Adult Health

## 2019-09-19 DIAGNOSIS — R8290 Unspecified abnormal findings in urine: Secondary | ICD-10-CM

## 2019-09-19 DIAGNOSIS — N390 Urinary tract infection, site not specified: Secondary | ICD-10-CM | POA: Diagnosis not present

## 2019-09-19 LAB — CBC WITH DIFFERENTIAL/PLATELET
Absolute Monocytes: 533 cells/uL (ref 200–950)
Basophils Absolute: 53 cells/uL (ref 0–200)
Basophils Relative: 0.7 %
Eosinophils Absolute: 120 cells/uL (ref 15–500)
Eosinophils Relative: 1.6 %
HCT: 46.9 % (ref 38.5–50.0)
Hemoglobin: 15.3 g/dL (ref 13.2–17.1)
Lymphs Abs: 2288 cells/uL (ref 850–3900)
MCH: 29.3 pg (ref 27.0–33.0)
MCHC: 32.6 g/dL (ref 32.0–36.0)
MCV: 89.7 fL (ref 80.0–100.0)
MPV: 10.9 fL (ref 7.5–12.5)
Monocytes Relative: 7.1 %
Neutro Abs: 4508 cells/uL (ref 1500–7800)
Neutrophils Relative %: 60.1 %
Platelets: 225 10*3/uL (ref 140–400)
RBC: 5.23 10*6/uL (ref 4.20–5.80)
RDW: 11.8 % (ref 11.0–15.0)
Total Lymphocyte: 30.5 %
WBC: 7.5 10*3/uL (ref 3.8–10.8)

## 2019-09-19 LAB — COMPLETE METABOLIC PANEL WITH GFR
AG Ratio: 1.6 (calc) (ref 1.0–2.5)
ALT: 20 U/L (ref 9–46)
AST: 14 U/L (ref 10–35)
Albumin: 4.3 g/dL (ref 3.6–5.1)
Alkaline phosphatase (APISO): 96 U/L (ref 35–144)
BUN: 19 mg/dL (ref 7–25)
CO2: 32 mmol/L (ref 20–32)
Calcium: 9.4 mg/dL (ref 8.6–10.3)
Chloride: 100 mmol/L (ref 98–110)
Creat: 1.05 mg/dL (ref 0.70–1.25)
GFR, Est African American: 87 mL/min/{1.73_m2} (ref >=60)
GFR, Est Non African American: 75 mL/min/{1.73_m2} (ref >=60)
Globulin: 2.7 g/dL (calc) (ref 1.9–3.7)
Glucose, Bld: 249 mg/dL — ABNORMAL HIGH (ref 65–99)
Potassium: 4.2 mmol/L (ref 3.5–5.3)
Sodium: 136 mmol/L (ref 135–146)
Total Bilirubin: 0.5 mg/dL (ref 0.2–1.2)
Total Protein: 7 g/dL (ref 6.1–8.1)

## 2019-09-19 LAB — URINALYSIS, ROUTINE W REFLEX MICROSCOPIC
Bilirubin Urine: NEGATIVE
Hgb urine dipstick: NEGATIVE
Hyaline Cast: NONE SEEN /LPF
Ketones, ur: NEGATIVE
Nitrite: POSITIVE — AB
Protein, ur: NEGATIVE
RBC / HPF: NONE SEEN /HPF (ref 0–2)
Specific Gravity, Urine: 1.033 (ref 1.001–1.03)
pH: 6.5 (ref 5.0–8.0)

## 2019-09-19 LAB — LIPID PANEL
Cholesterol: 177 mg/dL (ref <200)
HDL: 38 mg/dL — ABNORMAL LOW (ref >=40)
LDL Cholesterol (Calc): 105 mg/dL (calc) — ABNORMAL HIGH
Non-HDL Cholesterol (Calc): 139 mg/dL (calc) — ABNORMAL HIGH (ref <130)
Total CHOL/HDL Ratio: 4.7 (calc) (ref <5.0)
Triglycerides: 215 mg/dL — ABNORMAL HIGH (ref <150)

## 2019-09-19 LAB — MICROALBUMIN / CREATININE URINE RATIO
Creatinine, Urine: 107 mg/dL (ref 20–320)
Microalb Creat Ratio: 11 mcg/mg creat (ref <30)
Microalb, Ur: 1.2 mg/dL

## 2019-09-19 LAB — VITAMIN D 25 HYDROXY (VIT D DEFICIENCY, FRACTURES): Vit D, 25-Hydroxy: 20 ng/mL — ABNORMAL LOW (ref 30–100)

## 2019-09-19 LAB — TESTOSTERONE: Testosterone: 236 ng/dL — ABNORMAL LOW (ref 250–827)

## 2019-09-19 LAB — HEMOGLOBIN A1C
Hgb A1c MFr Bld: 8.1 % of total Hgb — ABNORMAL HIGH (ref <5.7)
Mean Plasma Glucose: 186 (calc)
eAG (mmol/L): 10.3 (calc)

## 2019-09-19 LAB — PSA: PSA: 1.5 ng/mL (ref <=4.0)

## 2019-09-19 LAB — VITAMIN B12: Vitamin B-12: 292 pg/mL (ref 200–1100)

## 2019-09-19 LAB — MAGNESIUM: Magnesium: 2.2 mg/dL (ref 1.5–2.5)

## 2019-09-19 LAB — TSH: TSH: 1.86 mIU/L (ref 0.40–4.50)

## 2019-09-19 MED ORDER — EMPAGLIFLOZIN 25 MG PO TABS
25.0000 mg | ORAL_TABLET | Freq: Every day | ORAL | 1 refills | Status: DC
Start: 2019-09-19 — End: 2020-10-12

## 2019-09-21 LAB — URINE CULTURE
MICRO NUMBER:: 10636992
SPECIMEN QUALITY:: ADEQUATE

## 2019-09-22 ENCOUNTER — Other Ambulatory Visit: Payer: Self-pay | Admitting: Adult Health

## 2019-09-22 MED ORDER — NITROFURANTOIN MONOHYD MACRO 100 MG PO CAPS: 100.0000 mg | ORAL_CAPSULE | Freq: Two times a day (BID) | ORAL | 0 refills | Status: DC

## 2019-10-09 ENCOUNTER — Ambulatory Visit: Payer: BC Managed Care – PPO

## 2019-10-20 ENCOUNTER — Other Ambulatory Visit: Payer: Self-pay | Admitting: Internal Medicine

## 2019-10-22 ENCOUNTER — Ambulatory Visit (INDEPENDENT_AMBULATORY_CARE_PROVIDER_SITE_OTHER): Payer: BC Managed Care – PPO

## 2019-10-22 ENCOUNTER — Other Ambulatory Visit: Payer: Self-pay

## 2019-10-22 VITALS — BP 140/72 | HR 67 | Temp 97.3°F | Wt 225.0 lb

## 2019-10-22 DIAGNOSIS — E349 Endocrine disorder, unspecified: Secondary | ICD-10-CM

## 2019-10-22 MED ORDER — TESTOSTERONE CYPIONATE 200 MG/ML IM SOLN
200.0000 mg | Freq: Once | INTRAMUSCULAR | Status: AC
  Administered 2019-10-22: 200 mg via INTRAMUSCULAR

## 2019-10-22 NOTE — Progress Notes (Signed)
Patient here for a NV to receive Testosterone Cypionate 200 mg/ml injection 2 ml IM inrightupper outer quadrant. Patient tolerated well

## 2019-11-03 ENCOUNTER — Other Ambulatory Visit: Payer: Self-pay | Admitting: Adult Health

## 2019-11-03 MED ORDER — PREDNISONE 20 MG PO TABS
ORAL_TABLET | ORAL | 0 refills | Status: DC
Start: 2019-11-03 — End: 2019-11-12

## 2019-11-12 ENCOUNTER — Ambulatory Visit (INDEPENDENT_AMBULATORY_CARE_PROVIDER_SITE_OTHER): Payer: BC Managed Care – PPO | Admitting: Adult Health

## 2019-11-12 ENCOUNTER — Encounter: Payer: Self-pay | Admitting: Adult Health

## 2019-11-12 ENCOUNTER — Other Ambulatory Visit: Payer: Self-pay

## 2019-11-12 VITALS — HR 75 | Temp 97.7°F | Wt 221.0 lb

## 2019-11-12 DIAGNOSIS — R21 Rash and other nonspecific skin eruption: Secondary | ICD-10-CM

## 2019-11-12 DIAGNOSIS — E349 Endocrine disorder, unspecified: Secondary | ICD-10-CM | POA: Diagnosis not present

## 2019-11-12 MED ORDER — DOXYCYCLINE HYCLATE 100 MG PO CAPS
ORAL_CAPSULE | ORAL | 0 refills | Status: DC
Start: 2019-11-12 — End: 2020-01-15

## 2019-11-12 MED ORDER — TESTOSTERONE CYPIONATE 200 MG/ML IM SOLN: INTRAMUSCULAR | 2 refills | Status: DC

## 2019-11-12 MED ORDER — PREDNISONE 20 MG PO TABS: ORAL_TABLET | ORAL | 0 refills | Status: DC

## 2019-11-12 NOTE — Progress Notes (Signed)
Assessment and Plan:  Diagnoses and all orders for this visit:  Rash and nonspecific skin eruption Nonspecific rash, however with report of improvement with prednisone will repeat for pruritis Will give doxycycline due to pustules Unclear etiology, but appears distal lesions are resolving spotaneously without recurrence Check all products and meds at home, if persistent will try stopping some medications, ? Med reaction Vs consider derm  Follow up if persistent or with any new sx -     predniSONE (DELTASONE) 20 MG tablet; 2 tablets daily for 3 days, 1 tablet daily for 4 days. -     doxycycline (VIBRAMYCIN) 100 MG capsule; Take 1 capsule twice daily with food  Further disposition pending results of labs. Discussed med's effects and SE's.   Over of exam, counseling, chart review, and critical decision making was performed.   Future Appointments  Date Time Provider Department Center  12/22/2019  9:30 AM Judd Gaudier, NP [facility] None  03/22/2020  9:30 AM Judd Gaudier, NP [facility] None  09/17/2020  9:30 AM Judd Gaudier, NP [facility] None    ------------------------------------------------------------------------------------------------------------------   HPI Pulse 75    Temp 97.7 F (36.5 C)    Wt 221 lb (100.2 kg)    SpO2 96%    BMI 29.97 kg/m   64 y.o.male presents for evaluation of rash "ankles to forehead." he reports this began with sudden pruritic scattered lesions to bilateral ankles, initially thought poison ivy though hadn't worked outdoors. Then he noted lesions progressing up, has had on legs, torso, bil arms, now on face. Lesions are sore and pruritic, has popped some with "pus" discharge. Lesions seem to resolve spontaneously in about 1 week. Ankles and lower body are now without any active lesions, currently mainly to neck and face.   He was prescribed a short course of prednisone. He reports this did seem to help. Hasn't tried anything else.    Denies HA, fever/chills, fatigue, arthralgias/myalgias, URI sx, GI sx, dizziness.  Lives with wife who does not have any sx.  Denies new hygiene products, detergent, new medications in the last 3-6 months.     Past Medical History:  Diagnosis Date   GERD (gastroesophageal reflux disease)    Hyperlipidemia    Hypertension    Other testicular hypofunction    Prediabetes    Vitamin D deficiency      Allergies  Allergen Reactions   Codeine     REACTION: Anxiety    Current Outpatient Medications on File Prior to Visit  Medication Sig   aspirin 81 MG chewable tablet Chew by mouth daily.   Cholecalciferol 1000 units capsule Take 5,000 Units by mouth daily.   empagliflozin (JARDIANCE) 25 MG TABS tablet Take 1 tablet (25 mg total) by mouth daily before breakfast.   ezetimibe (ZETIA) 10 MG tablet Take 1 tablet (10 mg total) by mouth daily.   metFORMIN (GLUCOPHAGE XR) 500 MG 24 hr tablet Take 2 tablets twice daily by mouth for diabetes.   nitrofurantoin, macrocrystal-monohydrate, (MACROBID) 100 MG capsule Take 1 capsule (100 mg total) by mouth 2 (two) times daily.   pravastatin (PRAVACHOL) 40 MG tablet Take 1 tablet at Bedtime for Cholesterol   predniSONE (DELTASONE) 20 MG tablet 2 tablets daily for 3 days, 1 tablet daily for 4 days.   tamsulosin (FLOMAX) 0.4 MG CAPS capsule Take 1 capsule (0.4 mg total) by mouth daily.   telmisartan (MICARDIS) 20 MG tablet Take 1 tablet Daily for BP   testosterone cypionate (DEPOTESTOSTERONE CYPIONATE) 200 MG/ML  injection Inject 2 ml into muscle every 2 weeks   zinc gluconate 50 MG tablet Take 50 mg by mouth daily.    No current facility-administered medications on file prior to visit.    ROS: all negative except above.   Physical Exam:  Pulse 75    Temp 97.7 F (36.5 C)    Wt 221 lb (100.2 kg)    SpO2 96%    BMI 29.97 kg/m   General Appearance: Well nourished, in no apparent distress. Eyes: PERRLA, conjunctiva no  swelling or erythema ENT/Mouth: Ext aud canals clear, TMs without erythema, bulging. No erythema, swelling, or exudate on post pharynx.  Tonsils not swollen or erythematous. MM moist and pink without lesions or erythema. Hearing normal.  Neck: Supple, thyroid normal.  Respiratory: Respiratory effort normal, BS equal bilaterally without rales, rhonchi, wheezing or stridor.  Cardio: RRR with no MRGs. Brisk peripheral pulses without edema.  Abdomen: Soft, + BS.  Non tender, no guarding, rebound, hernias, masses. Lymphatics: Non tender without lymphadenopathy.  Musculoskeletal: No obvious deformity, no effusions, normal gait.  Skin: Warm, dry; he has scattered lesions, erythematous with central scab/excoriation, a few appear small pustules, to face and neck; rare to upper back, torso, upper extremities; no active lesions to lower extremities (mild pink spots remaining). Sparing MM, palms/soles and scalp.  Neuro: Cranial nerves intact. Normal muscle tone, no cerebellar symptoms. Sensation intact.  Psych: Awake and oriented X 3, normal affect, Insight and Judgment appropriate.     Dan Maker, NP 9:16 AM [facility]

## 2019-11-12 NOTE — Patient Instructions (Signed)
Rash, Adult A rash is a change in the color of your skin. A rash can also change the way your skin feels. There are many different conditions and factors that can cause a rash. Some rashes may disappear after a few days, but some may last for a few weeks. Common causes of rashes include:  Viral infections, such as: ? Colds. ? Measles. ? Hand, foot, and mouth disease.  Bacterial infections, such as: ? Scarlet fever. ? Impetigo.  Fungal infections, such as Candida.  Allergic reactions to food, medicines, or skin care products. Follow these instructions at home: The goal of treatment is to stop the itching and keep the rash from spreading. Pay attention to any changes in your symptoms. Follow these instructions to help with your condition: Medicine Take or apply over-the-counter and prescription medicines only as told by your health care provider. These may include:  Corticosteroid creams to treat red or swollen skin.  Anti-itch lotions.  Oral allergy medicines (antihistamines).  Oral corticosteroids for severe symptoms.  Skin care  Apply cool compresses to the affected areas.  Do not scratch or rub your skin.  Avoid covering the rash. Make sure the rash is exposed to air as much as possible. Managing itching and discomfort  Avoid hot showers or baths, which can make itching worse. A cold shower may help.  Try taking a bath with: ? Epsom salts. Follow manufacturer instructions on the packaging. You can get these at your local pharmacy or grocery store. ? Baking soda. Pour a small amount into the bath as told by your health care provider. ? Colloidal oatmeal. Follow manufacturer instructions on the packaging. You can get this at your local pharmacy or grocery store.  Try applying baking soda paste to your skin. Stir water into baking soda until it reaches a paste-like consistency.  Try applying calamine lotion. This is an over-the-counter lotion that helps to relieve  itchiness.  Keep cool and out of the sun. Sweating and being hot can make itching worse. General instructions   Rest as needed.  Drink enough fluid to keep your urine pale yellow.  Wear loose-fitting clothing.  Avoid scented soaps, detergents, and perfumes. Use gentle soaps, detergents, perfumes, and other cosmetic products.  Avoid any substance that causes your rash. Keep a journal to help track what causes your rash. Write down: ? What you eat. ? What cosmetic products you use. ? What you drink. ? What you wear. This includes jewelry.  Keep all follow-up visits as told by your health care provider. This is important. Contact a health care provider if:  You sweat at night.  You lose weight.  You urinate more than normal.  You urinate less than normal, or you notice that your urine is a darker color than usual.  You feel weak.  You vomit.  Your skin or the whites of your eyes look yellow (jaundice).  Your skin: ? Tingles. ? Is numb.  Your rash: ? Does not go away after several days. ? Gets worse.  You are: ? Unusually thirsty. ? More tired than normal.  You have: ? New symptoms. ? Pain in your abdomen. ? A fever. ? Diarrhea. Get help right away if you:  Have a fever and your symptoms suddenly get worse.  Develop confusion.  Have a severe headache or a stiff neck.  Have severe joint pains or stiffness.  Have a seizure.  Develop a rash that covers all or most of your body. The rash  may or may not be painful.  Develop blisters that: ? Are on top of the rash. ? Grow larger or grow together. ? Are painful. ? Are inside your nose or mouth.  Develop a rash that: ? Looks like purple pinprick-sized spots all over your body. ? Has a "bull's eye" or looks like a target. ? Is not related to sun exposure, is red and painful, and causes your skin to peel. Summary  A rash is a change in the color of your skin. Some rashes disappear after a few days,  but some may last for a few weeks.  The goal of treatment is to stop the itching and keep the rash from spreading.  Take or apply over-the-counter and prescription medicines only as told by your health care provider.  Contact a health care provider if you have new or worsening symptoms.  Keep all follow-up visits as told by your health care provider. This is important. This information is not intended to replace advice given to you by your health care provider. Make sure you discuss any questions you have with your health care provider. Document Revised: 07/05/2018 Document Reviewed: 10/15/2017 Elsevier Patient Education  [place].      Doxycycline tablets or capsules What is this medicine? DOXYCYCLINE (dox i SYE kleen) is a tetracycline antibiotic. It kills certain bacteria or stops their growth. It is used to treat many kinds of infections, like dental, skin, respiratory, and urinary tract infections. It also treats acne, Lyme disease, malaria, and certain sexually transmitted infections. This medicine may be used for other purposes; ask your health care provider or pharmacist if you have questions. COMMON BRAND NAME(S): Acticlate, Adoxa, Adoxa CK, Adoxa Pak, Adoxa TT, Alodox, Avidoxy, Doxal, LYMEPAK, Mondoxyne NL, Monodox, Morgidox 1x, Morgidox 1x Kit, Morgidox 2x, Morgidox 2x Kit, NutriDox, Ocudox, [place], [place], Vibra-Tabs, Vibramycin What should I tell my health care provider before I take this medicine? They need to know if you have any of these conditions:  liver disease  long exposure to sunlight like working outdoors  stomach problems like colitis  an unusual or allergic reaction to doxycycline, tetracycline antibiotics, other medicines, foods, dyes, or preservatives  pregnant or trying to get pregnant  breast-feeding How should I use this medicine? Take this medicine by mouth with a full glass of water. Follow the directions on the prescription label. It is  best to take this medicine without food, but if it upsets your stomach take it with food. Take your medicine at regular intervals. Do not take your medicine more often than directed. Take all of your medicine as directed even if you think you are better. Do not skip doses or stop your medicine early. Talk to your pediatrician regarding the use of this medicine in children. While this drug may be prescribed for selected conditions, precautions do apply. Overdosage: If you think you have taken too much of this medicine contact a poison control center or emergency room at once. NOTE: This medicine is only for you. Do not share this medicine with others. What if I miss a dose? If you miss a dose, take it as soon as you can. If it is almost time for your next dose, take only that dose. Do not take double or extra doses. What may interact with this medicine?  antacids  barbiturates  birth control pills  bismuth subsalicylate  carbamazepine  methoxyflurane  other antibiotics  phenytoin  vitamins that contain iron  warfarin This list may not  describe all possible interactions. Give your health care provider a list of all the medicines, herbs, non-prescription drugs, or dietary supplements you use. Also tell them if you smoke, drink alcohol, or use illegal drugs. Some items may interact with your medicine. What should I watch for while using this medicine? Tell your doctor or health care professional if your symptoms do not improve. Do not treat diarrhea with over the counter products. Contact your doctor if you have diarrhea that lasts more than 2 days or if it is severe and watery. Do not take this medicine just before going to bed. It may not dissolve properly when you lay down and can cause pain in your throat. Drink plenty of fluids while taking this medicine to also help reduce irritation in your throat. This medicine can make you more sensitive to the sun. Keep out of the sun. If you  cannot avoid being in the sun, wear protective clothing and use sunscreen. Do not use sun lamps or tanning beds/booths. Birth control pills may not work properly while you are taking this medicine. Talk to your doctor about using an extra method of birth control. If you are being treated for a sexually transmitted infection, avoid sexual contact until you have finished your treatment. Your sexual partner may also need treatment. Avoid antacids, aluminum, calcium, magnesium, and iron products for 4 hours before and 2 hours after taking a dose of this medicine. If you are using this medicine to prevent malaria, you should still protect yourself from contact with mosquitos. Stay in screened-in areas, use mosquito nets, keep your body covered, and use an insect repellent. What side effects may I notice from receiving this medicine? Side effects that you should report to your doctor or health care professional as soon as possible:  allergic reactions like skin rash, itching or hives, swelling of the face, lips, or tongue  difficulty breathing  fever  itching in the rectal or genital area  pain on swallowing  rash, fever, and swollen lymph nodes  redness, blistering, peeling or loosening of the skin, including inside the mouth  severe stomach pain or cramps  unusual bleeding or bruising  unusually weak or tired  yellowing of the eyes or skin Side effects that usually do not require medical attention (report to your doctor or health care professional if they continue or are bothersome):  diarrhea  loss of appetite  nausea, vomiting This list may not describe all possible side effects. Call your doctor for medical advice about side effects. You may report side effects to FDA at 1-800-FDA-1088. Where should I keep my medicine? Keep out of the reach of children. Store at room temperature, below 30 degrees C (86 degrees F). Protect from light. Keep container tightly closed. Throw away any  unused medicine after the expiration date. Taking this medicine after the expiration date can make you seriously ill. NOTE: This sheet is a summary. It may not cover all possible information. If you have questions about this medicine, talk to your doctor, pharmacist, or health care provider.  2020 Elsevier/Gold Standard (2018-06-13 13:44:53)

## 2019-11-18 ENCOUNTER — Ambulatory Visit (INDEPENDENT_AMBULATORY_CARE_PROVIDER_SITE_OTHER): Payer: BC Managed Care – PPO | Admitting: *Deleted

## 2019-11-18 ENCOUNTER — Other Ambulatory Visit: Payer: Self-pay

## 2019-11-18 VITALS — BP 140/82 | HR 60 | Temp 97.4°F | Resp 16 | Wt 220.0 lb

## 2019-11-18 DIAGNOSIS — E349 Endocrine disorder, unspecified: Secondary | ICD-10-CM | POA: Diagnosis not present

## 2019-11-18 MED ORDER — TESTOSTERONE CYPIONATE 200 MG/ML IM SOLN
400.0000 mg | Freq: Once | INTRAMUSCULAR | Status: AC
  Administered 2019-11-18: 400 mg via INTRAMUSCULAR

## 2019-11-18 NOTE — Progress Notes (Signed)
Patient is here for a NV to receive his Testosterone Cypionate 200 mg/ml 2 ml IM left upper outer quadrant. Patient tolerated well and will return in 2 weeks for his next injection.

## 2019-12-12 ENCOUNTER — Other Ambulatory Visit: Payer: Self-pay

## 2019-12-12 ENCOUNTER — Ambulatory Visit (INDEPENDENT_AMBULATORY_CARE_PROVIDER_SITE_OTHER): Payer: BC Managed Care – PPO

## 2019-12-12 VITALS — BP 128/88 | HR 72 | Temp 97.6°F | Wt 221.0 lb

## 2019-12-12 DIAGNOSIS — E291 Testicular hypofunction: Secondary | ICD-10-CM

## 2019-12-19 NOTE — Progress Notes (Deleted)
FOLLOW UP  Assessment and Plan:   Essential hypertension   - continue medications, DASH diet, exercise and monitor at home. Call if greater than 130/80.  -     CBC with Differential/Platelet -     COMPLETE METABOLIC PANEL WITH GFR -     TSH  Type 2 diabetes mellitus with stage 2 chronic kidney disease, without long-term current use of insulin (HCC) Discussed general issues about diabetes pathophysiology and management., Educational material distributed., Suggested low cholesterol diet., Encouraged aerobic exercise., Discussed foot care., Reminded to get yearly retinal exam. Diabetic foot exam completed GET EYE EXAM - patient states will schedule Back on metformin, tolerating 500 mg BID, jardiance *** Has glucometer - advised needs to check at least weekly       -     Hemoglobin A1c  CKD stage 2 due to type 2 diabetes mellitus (HCC) Increase fluids, avoid NSAIDS, monitor sugars, will monitor -     COMPLETE METABOLIC PANEL WITH GFR  Mixed hyperlipidemia associated with T2DM (HCC) On pravastatin due to CK^ with rosuvastatin, continue zetia for LDL goal <70  decrease fatty foods increase activity.  -     Lipid panel  Testosterone deficiency Continued perceived benefits with supplementation, no SE. Check level, recommend zinc 50 mg daily, no symptoms of elevated estrogen or dihydrotestosterone.  -     Testosterone *** -     testosterone cypionate (DEPOTESTOSTERONE CYPIONATE) injection 200 mg  Obesity (BMI 30.0-34.9) - follow up 3 months for progress monitoring - increase veggies, decrease carbs - long discussion about weight loss, diet, and exercise  Medication management -     Magnesium  Vitamin D deficiency *** -     VITAMIN D 25 Hydroxy (Vit-D Deficiency, Fractures)  Gastroesophageal reflux disease, esophagitis presence not specified Continue PPI/H2 blocker, diet discussed  B12 deficiency  *** -     Vitamin B12  Continue diet and meds as discussed. Further  disposition pending results of labs. Discussed med's effects and SE's.   Over 30 minutes of exam, counseling, chart review, and critical decision making was performed.   Future Appointments  Date Time Provider Department Center  12/22/2019  9:30 AM Judd Gaudier, NP [facility] None  03/22/2020  9:30 AM [name], NP [facility] None  09/17/2020 11:00 AM Lucky Cowboy, MD [facility] None    ----------------------------------------------------------------------------------------------------------------------  HPI 64 y.o. male  presents for 3 month follow up on hypertension, cholesterol, diabetes with CKD, obesity, testosterone def and vitamin D deficiency.     BMI is There is no height or weight on file to calculate BMI., he {HAS HAS PVV:74827} been working on diet and exercise. Wt Readings from Last 3 Encounters:  12/12/19 221 lb (100.2 kg)  11/18/19 220 lb (99.8 kg)  11/12/19 221 lb (100.2 kg)   Stress test 2007 normal His blood pressure {HAS HAS NOT:18834} been controlled at home, today their BP is    He {DOES_DOES MBE:67544} workout. He denies chest pain, shortness of breath, dizziness.   He is on cholesterol medication Pravastatin and Zetia and denies myalgias. His cholesterol is not at goal. The cholesterol last visit was:   Lab Results  Component Value Date   CHOL 177 09/18/2019   HDL 38 (L) 09/18/2019   LDLCALC 105 (H) 09/18/2019   TRIG 215 (H) 09/18/2019   CHOLHDL 4.7 09/18/2019   He has been working on diet and exercise for T2 Diabetes  CKD on ACE Hyperlipidemia on pravastatin  Eye exam NEVER *** he  is on bASA On metformin - takes 500 mg BID, can't tolerate more - GI sx, Jardiance *** HE DOES NOT CHECK HIS SUGARS AT HOME, does have glucometer and denies paresthesia of the feet, polydipsia, polyuria and visual disturbances.   Last A1C in the office was:  Lab Results  Component Value Date   HGBA1C 8.1 (H) 09/18/2019   Has CKD II associated with T2DM, on  telmisartan, jardiance; last GFR:   Lab Results  Component Value Date   GFRNONAA 42 09/18/2019   He has a history of testosterone deficiency and is on testosterone replacement. He states that the testosterone helps with his energy, libido, muscle mass.  Taking 400 mg q2w; last *** Denies symptoms of elevated estrogen or dihydrotestosterone.  Lab Results  Component Value Date   TESTOSTERONE 236 (L) 09/18/2019   Patient is *** on Vitamin D supplement.   Lab Results  Component Value Date   VD25OH 20 (L) 09/18/2019     *** Lab Results  Component Value Date   VITAMINB12 292 09/18/2019      Current Medications:  Current Outpatient Medications on File Prior to Visit  Medication Sig  . aspirin 81 MG chewable tablet Chew by mouth daily.  . Cholecalciferol 1000 units capsule Take 5,000 Units by mouth daily.  Marland Kitchen doxycycline (VIBRAMYCIN) 100 MG capsule Take 1 capsule twice daily with food  . empagliflozin (JARDIANCE) 25 MG TABS tablet Take 1 tablet (25 mg total) by mouth daily before breakfast.  . ezetimibe (ZETIA) 10 MG tablet Take 1 tablet (10 mg total) by mouth daily.  . metFORMIN (GLUCOPHAGE XR) 500 MG 24 hr tablet Take 2 tablets twice daily by mouth for diabetes.  . pravastatin (PRAVACHOL) 40 MG tablet Take 1 tablet at Bedtime for Cholesterol  . predniSONE (DELTASONE) 20 MG tablet 2 tablets daily for 3 days, 1 tablet daily for 4 days.  . tamsulosin (FLOMAX) 0.4 MG CAPS capsule Take 1 capsule (0.4 mg total) by mouth daily.  Marland Kitchen telmisartan (MICARDIS) 20 MG tablet Take 1 tablet Daily for BP  . testosterone cypionate (DEPOTESTOSTERONE CYPIONATE) 200 MG/ML injection Inject 2 ml into muscle every 2 weeks  . zinc gluconate 50 MG tablet Take 50 mg by mouth daily.    No current facility-administered medications on file prior to visit.     Allergies:  Allergies  Allergen Reactions  . Codeine     REACTION: Anxiety     Medical History:  Past Medical History:  Diagnosis Date  . GERD  (gastroesophageal reflux disease)   . Hyperlipidemia   . Hypertension   . Other testicular hypofunction   . Prediabetes   . Vitamin D deficiency    Family history- Reviewed and unchanged Social history- Reviewed and unchanged   Review of Systems:  Review of Systems  Constitutional: Negative for malaise/fatigue and weight loss.  HENT: Negative for hearing loss and tinnitus.   Eyes: Negative for blurred vision and double vision.  Respiratory: Negative for cough, shortness of breath and wheezing.   Cardiovascular: Negative for chest pain, palpitations, orthopnea, claudication and leg swelling.  Gastrointestinal: Negative for abdominal pain, blood in stool, constipation, diarrhea, heartburn, melena, nausea and vomiting.  Genitourinary: Negative.   Musculoskeletal: Negative for joint pain and myalgias.  Skin: Negative for rash.  Neurological: Negative for dizziness, tingling, sensory change, weakness and headaches.  Endo/Heme/Allergies: Negative for polydipsia.  Psychiatric/Behavioral: Negative.   All other systems reviewed and are negative.     Physical Exam: There were no vitals  taken for this visit. Wt Readings from Last 3 Encounters:  12/12/19 221 lb (100.2 kg)  11/18/19 220 lb (99.8 kg)  11/12/19 221 lb (100.2 kg)   General Appearance: Well nourished, in no apparent distress. Eyes: PERRLA, EOMs, conjunctiva no swelling or erythema Sinuses: No Frontal/maxillary tenderness ENT/Mouth: Ext aud canals clear, TMs without erythema, bulging. No erythema, swelling, or exudate on post pharynx.  Tonsils not swollen or erythematous. Hearing normal.  Neck: Supple, thyroid normal.  Respiratory: Respiratory effort normal, BS equal bilaterally without rales, rhonchi, wheezing or stridor.  Cardio: RRR with no MRGs. Brisk peripheral pulses without edema.  Abdomen: Soft, + BS.  Non tender, no guarding, rebound, hernias, masses. Lymphatics: Non tender without lymphadenopathy.   Musculoskeletal: Full ROM, 5/5 strength, {PSY - GAIT AND STATION:22860} gait Skin: Warm, dry without rashes, lesions, ecchymosis.  Neuro: Cranial nerves intact. No cerebellar symptoms.  Psych: Awake and oriented X 3, normal affect, Insight and Judgment appropriate.    Dan Maker, NP 2:13 PM [facility]

## 2019-12-22 ENCOUNTER — Ambulatory Visit: Payer: BC Managed Care – PPO | Admitting: Adult Health

## 2019-12-22 DIAGNOSIS — I10 Essential (primary) hypertension: Secondary | ICD-10-CM

## 2020-01-15 ENCOUNTER — Telehealth: Payer: Self-pay | Admitting: *Deleted

## 2020-01-15 ENCOUNTER — Encounter: Payer: Self-pay | Admitting: Adult Health Nurse Practitioner

## 2020-01-15 ENCOUNTER — Other Ambulatory Visit: Payer: Self-pay

## 2020-01-15 ENCOUNTER — Ambulatory Visit (INDEPENDENT_AMBULATORY_CARE_PROVIDER_SITE_OTHER): Payer: BC Managed Care – PPO | Admitting: Adult Health Nurse Practitioner

## 2020-01-15 VITALS — BP 130/72 | HR 77 | Temp 97.6°F | Wt 220.0 lb

## 2020-01-15 DIAGNOSIS — E785 Hyperlipidemia, unspecified: Secondary | ICD-10-CM | POA: Diagnosis not present

## 2020-01-15 DIAGNOSIS — E1169 Type 2 diabetes mellitus with other specified complication: Secondary | ICD-10-CM

## 2020-01-15 DIAGNOSIS — Z79899 Other long term (current) drug therapy: Secondary | ICD-10-CM

## 2020-01-15 DIAGNOSIS — E1122 Type 2 diabetes mellitus with diabetic chronic kidney disease: Secondary | ICD-10-CM

## 2020-01-15 DIAGNOSIS — I10 Essential (primary) hypertension: Secondary | ICD-10-CM | POA: Diagnosis not present

## 2020-01-15 DIAGNOSIS — R0602 Shortness of breath: Secondary | ICD-10-CM | POA: Diagnosis not present

## 2020-01-15 DIAGNOSIS — R079 Chest pain, unspecified: Secondary | ICD-10-CM

## 2020-01-15 DIAGNOSIS — N182 Chronic kidney disease, stage 2 (mild): Secondary | ICD-10-CM

## 2020-01-15 NOTE — Progress Notes (Signed)
Assessment and Plan:  Mark [name] was seen today for acute visit.  Diagnoses and all orders for this visit:  Chest pain, unspecified type -     EKG 12-Lead -     Cancel: D-dimer, quantitative (not at [facility]) -     Cancel: Troponin I -     Cancel: CBC with Differential/Platelet -     Cancel: COMPLETE METABOLIC PANEL WITH GFR -     Troponin I -     D-dimer, quantitative (not at [facility]) Discussed hospita precautions at length today.  Type 2 diabetes mellitus with stage 2 chronic kidney disease, without long-term current use of insulin (HCC) Does not check blood glucose. Last A1c 8.1 -     Hemoglobin A1c  Hyperlipidemia associated with type 2 diabetes mellitus (HCC) Continue medications: Pravastatin 40mg  Consider change to rosuvastatin related to maximizing treatment Discussed dietary and exercise modifications Low fat diet -     Lipid panel  Primary hypertension Continue to monitor blood pressure -     CBC with Differential/Platelet -     COMPLETE METABOLIC PANEL WITH GFR -     TSH -     Magnesium  SOB (shortness of breath) -     Troponin I     Further disposition pending results of labs. Discussed med's effects and SE's.   Over 30 minutes of face to face interview, exam, counseling, chart review, and critical decision making was performed.   Future Appointments  Date Time Provider Department Center  01/29/2020  9:30 AM 13/06/2019, NP [facility] None  03/22/2020  9:30 AM 03/24/2020, NP [facility] None  09/17/2020 11:00 AM 09/19/2020, MD [facility] None    ------------------------------------------------------------------------------------------------------------------   HPI 64 y.o.male presents for chest pain.  At 0200 he had right sided jaw pain and chest pain, he got up and walked around and it went away. Was able to go back to sleep.  When he woke up he had chest pain. He said it felt like an elephant sitting on his chest. This lasted about 30-1hour.  He  wasn't;t sure of exact duration.  Denies any numbness or tingling during this.  HE WAS ADVISED REPEATEDLY TO GO TO THE ER FOR EVALUATION. Despite recommendation he was evaluated by out office.   He did take take some TUMS today thinking he could have ireful.  He was not experiencing the pain when he took them so unsure if it helped.  He denies any headache, change in vision, shortness of breath.  Reports he does drink caffeine, pepsi, all day long while he is working.  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   Cholecalciferol 1000 units capsule Take 5,000 Units by mouth daily.   empagliflozin (JARDIANCE) 25 MG TABS tablet Take 1 tablet (25 mg total) by mouth daily before breakfast.   ezetimibe (ZETIA) 10 MG tablet Take 1 tablet (10 mg total) by mouth daily.   metFORMIN (GLUCOPHAGE XR) 500 MG 24 hr tablet Take 2 tablets twice daily by mouth for diabetes.  tamsulosin (FLOMAX) 0.4 MG CAPS capsule Take 1 capsule (0.4 mg total) by mouth daily.   telmisartan (MICARDIS) 20 MG tablet Take 1 tablet Daily for BP   testosterone cypionate (DEPOTESTOSTERONE CYPIONATE) 200 MG/ML injection Inject 2 ml into muscle every 2 weeks   zinc gluconate 50 MG tablet Take 50 mg by mouth daily.    No current facility-administered medications on file prior to visit.    ROS: all negative except above.   Physical Exam:  BP 130/72    Pulse 77    Temp 97.6 F (36.4 C)    Wt 220 lb (99.8 kg)    SpO2 96%    BMI 29.84 kg/m   General Appearance: Well nourished, in no apparent distress. Eyes: PERRLA, EOMs,  conjunctiva no swelling or erythema Sinuses: No Frontal/maxillary tenderness ENT/Mouth: Ext aud canals clear, TMs without erythema, bulging. No erythema, swelling, or exudate on post pharynx.  Tonsils not swollen or erythematous. Hearing normal.  Neck: Supple, thyroid normal.  Respiratory: Respiratory effort normal, BS equal bilaterally without rales, rhonchi, wheezing or stridor.  Cardio: RRR with no MRGs. Brisk peripheral pulses without edema.  Abdomen: Soft, + BS.  Non tender, no guarding, rebound, hernias, masses. Lymphatics: Non tender without lymphadenopathy.  Musculoskeletal: Full ROM, 5/5 strength, normal gait.  Skin: Warm, dry without rashes, lesions, ecchymosis.  Neuro: Cranial nerves intact. Normal muscle tone, no cerebellar symptoms. Sensation intact.  Psych: Awake and oriented X 3, normal affect, Insight and Judgment appropriate.      Mark [name], Mark Oh, DNP [facility] 01/26/2020  8:34 AM

## 2020-01-15 NOTE — Telephone Encounter (Signed)
Called patient and advised patient to go to ED or [facility] on [address] to be evaluated for chest and jaw pain, per Dr Oneta Rack. Patient states he will probably keep his OV this afternoon, with Kyra McClanahan. Dr Oneta Rack is aware.

## 2020-01-16 DIAGNOSIS — N182 Chronic kidney disease, stage 2 (mild): Secondary | ICD-10-CM

## 2020-01-16 DIAGNOSIS — E1122 Type 2 diabetes mellitus with diabetic chronic kidney disease: Secondary | ICD-10-CM

## 2020-01-16 LAB — HEMOGLOBIN A1C
Hgb A1c MFr Bld: 9.3 % of total Hgb — ABNORMAL HIGH (ref <5.7)
Mean Plasma Glucose: 220 (calc)
eAG (mmol/L): 12.2 (calc)

## 2020-01-16 LAB — COMPLETE METABOLIC PANEL WITH GFR
AG Ratio: 1.6 (calc) (ref 1.0–2.5)
ALT: 18 U/L (ref 9–46)
AST: 13 U/L (ref 10–35)
Albumin: 4.1 g/dL (ref 3.6–5.1)
Alkaline phosphatase (APISO): 122 U/L (ref 35–144)
BUN: 16 mg/dL (ref 7–25)
CO2: 28 mmol/L (ref 20–32)
Calcium: 9.5 mg/dL (ref 8.6–10.3)
Chloride: 101 mmol/L (ref 98–110)
Creat: 1.12 mg/dL (ref 0.70–1.25)
GFR, Est African American: 80 mL/min/{1.73_m2} (ref >=60)
GFR, Est Non African American: 69 mL/min/{1.73_m2} (ref >=60)
Globulin: 2.5 g/dL (calc) (ref 1.9–3.7)
Glucose, Bld: 407 mg/dL — ABNORMAL HIGH (ref 65–99)
Potassium: 4.4 mmol/L (ref 3.5–5.3)
Sodium: 136 mmol/L (ref 135–146)
Total Bilirubin: 0.3 mg/dL (ref 0.2–1.2)
Total Protein: 6.6 g/dL (ref 6.1–8.1)

## 2020-01-16 LAB — CBC WITH DIFFERENTIAL/PLATELET
Absolute Monocytes: 506 cells/uL (ref 200–950)
Basophils Absolute: 38 cells/uL (ref 0–200)
Basophils Relative: 0.6 %
Eosinophils Absolute: 243 cells/uL (ref 15–500)
Eosinophils Relative: 3.8 %
HCT: 46.4 % (ref 38.5–50.0)
Hemoglobin: 15.1 g/dL (ref 13.2–17.1)
Lymphs Abs: 1952 cells/uL (ref 850–3900)
MCH: 29.4 pg (ref 27.0–33.0)
MCHC: 32.5 g/dL (ref 32.0–36.0)
MCV: 90.3 fL (ref 80.0–100.0)
MPV: 11.2 fL (ref 7.5–12.5)
Monocytes Relative: 7.9 %
Neutro Abs: 3661 cells/uL (ref 1500–7800)
Neutrophils Relative %: 57.2 %
Platelets: 220 10*3/uL (ref 140–400)
RBC: 5.14 10*6/uL (ref 4.20–5.80)
RDW: 11.8 % (ref 11.0–15.0)
Total Lymphocyte: 30.5 %
WBC: 6.4 10*3/uL (ref 3.8–10.8)

## 2020-01-16 LAB — TSH: TSH: 1.57 mIU/L (ref 0.40–4.50)

## 2020-01-16 LAB — LIPID PANEL
Cholesterol: 157 mg/dL (ref <200)
HDL: 45 mg/dL (ref >=40)
LDL Cholesterol (Calc): 81 mg/dL (calc)
Non-HDL Cholesterol (Calc): 112 mg/dL (calc) (ref <130)
Total CHOL/HDL Ratio: 3.5 (calc) (ref <5.0)
Triglycerides: 212 mg/dL — ABNORMAL HIGH (ref <150)

## 2020-01-16 LAB — MAGNESIUM: Magnesium: 2.1 mg/dL (ref 1.5–2.5)

## 2020-01-16 LAB — D-DIMER, QUANTITATIVE: D-Dimer, Quant: 0.26 mcg/mL FEU (ref <0.50)

## 2020-01-16 LAB — TROPONIN I: Troponin I: 10 ng/L (ref <=47)

## 2020-01-16 NOTE — Progress Notes (Signed)
Contacted patient to discuss lab results.  Patient reports he had a course of prednisone, likely the cause of the elevation.  Discussed taking insulin in the interm to lower this. Patient refused insulin treatment at this time.  Discussed hyper/hypo glycemia symptoms.  Discussed importance of checking blood glucose regularly and goals.  AGAIN discussed hospital precautions with patient and importance of glucose management and monitoring.  Follow up next week for diabetes education and review glucose log.

## 2020-01-19 ENCOUNTER — Other Ambulatory Visit: Payer: Self-pay | Admitting: Adult Health Nurse Practitioner

## 2020-01-19 ENCOUNTER — Ambulatory Visit: Payer: BC Managed Care – PPO | Admitting: Adult Health Nurse Practitioner

## 2020-01-19 ENCOUNTER — Ambulatory Visit (INDEPENDENT_AMBULATORY_CARE_PROVIDER_SITE_OTHER): Payer: BC Managed Care – PPO | Admitting: *Deleted

## 2020-01-19 ENCOUNTER — Other Ambulatory Visit: Payer: Self-pay

## 2020-01-19 VITALS — BP 130/82 | HR 79 | Temp 97.2°F | Resp 16 | Wt 218.4 lb

## 2020-01-19 DIAGNOSIS — E785 Hyperlipidemia, unspecified: Secondary | ICD-10-CM

## 2020-01-19 DIAGNOSIS — E1169 Type 2 diabetes mellitus with other specified complication: Secondary | ICD-10-CM

## 2020-01-19 DIAGNOSIS — E291 Testicular hypofunction: Secondary | ICD-10-CM | POA: Diagnosis not present

## 2020-01-19 MED ORDER — ROSUVASTATIN CALCIUM 5 MG PO TABS: ORAL_TABLET | ORAL | 3 refills | Status: DC

## 2020-01-19 MED ORDER — TESTOSTERONE CYPIONATE 200 MG/ML IM SOLN
400.0000 mg | Freq: Once | INTRAMUSCULAR | Status: AC
  Administered 2020-01-19: 400 mg via INTRAMUSCULAR

## 2020-01-19 NOTE — Progress Notes (Signed)
Patient here for a NV for Testosterone Cypionate 200 mg/ml 2 ml IM injection in left upper outer quadrant. Patient tolerated well.

## 2020-01-25 ENCOUNTER — Other Ambulatory Visit: Payer: Self-pay | Admitting: Adult Health Nurse Practitioner

## 2020-01-25 DIAGNOSIS — N182 Chronic kidney disease, stage 2 (mild): Secondary | ICD-10-CM

## 2020-01-25 DIAGNOSIS — E1122 Type 2 diabetes mellitus with diabetic chronic kidney disease: Secondary | ICD-10-CM

## 2020-01-25 MED ORDER — GLUCOSE BLOOD VI STRP: ORAL_STRIP | 4 refills | Status: DC

## 2020-01-29 ENCOUNTER — Other Ambulatory Visit: Payer: Self-pay

## 2020-01-29 ENCOUNTER — Ambulatory Visit (INDEPENDENT_AMBULATORY_CARE_PROVIDER_SITE_OTHER): Payer: BC Managed Care – PPO | Admitting: Adult Health Nurse Practitioner

## 2020-01-29 ENCOUNTER — Encounter: Payer: Self-pay | Admitting: Adult Health Nurse Practitioner

## 2020-01-29 VITALS — BP 128/76 | HR 71 | Temp 97.3°F | Wt 221.0 lb

## 2020-01-29 DIAGNOSIS — I10 Essential (primary) hypertension: Secondary | ICD-10-CM

## 2020-01-29 DIAGNOSIS — N182 Chronic kidney disease, stage 2 (mild): Secondary | ICD-10-CM

## 2020-01-29 DIAGNOSIS — E1122 Type 2 diabetes mellitus with diabetic chronic kidney disease: Secondary | ICD-10-CM | POA: Diagnosis not present

## 2020-01-29 NOTE — Progress Notes (Signed)
Diabetes Education and Follow-Up Visit  64 y.o.male presents for diabetic education. He has Diabetes Mellitus type 2:  , he is on bASA, and denies increased appetite, nausea, paresthesia of the feet, polydipsia, polyuria, visual disturbances, vomiting and weight loss.  Last hemoglobin A1c was: Lab Results  Component Value Date   HGBA1C 9.3 (H) 01/15/2020   HGBA1C 8.1 (H) 09/18/2019   HGBA1C 7.9 (H) 06/05/2019    Body mass index is 29.97 kg/m.  Pt is on a regimen of: Glucophage XR 500mg  two tablet BID and Jardiance  Pt checks his sugars 3 x day  Lowest sugar was 126.  120-130's in am.  136 this am.   He has hypoglycemia awareness.  Highest sugar was 240 after lunch. Range in afternoon 200-240  Glucometer: Freestyle lite  He has stopped drinking diet sodas all together.   He has been watching the foods he eats and making adjustments to his diet. Decrease carbohydrate and sugar intake  Exercise: He walk on the treadmill for 3-5 min. He lifts weight at the gym  Last Hemoglobin A1c 9.3   Patient does have CKD2 He is on ACE/ARB  Lab Results  Component Value Date   GFRAA 80 01/15/2020      Lab Results  Component Value Date   GFRNONAA 69 01/15/2020    Lab Results  Component Value Date   CREATININE 1.12 01/15/2020   BUN 16 01/15/2020   NA 136 01/15/2020   K 4.4 01/15/2020   CL 101 01/15/2020   CO2 28 01/15/2020    Lab Results  Component Value Date   MICROALBUR 1.2 09/18/2019     He is on a Statin.  He is not at goal of less than 70.  Lab Results  Component Value Date   CHOL 157 01/15/2020   HDL 45 01/15/2020   LDLCALC 81 01/15/2020   TRIG 212 (H) 01/15/2020   CHOLHDL 3.5 01/15/2020     Problem List has Hyperlipidemia associated with type 2 diabetes mellitus (HCC); Hypertension; Type 2 diabetes mellitus (HCC); GERD (gastroesophageal reflux disease); Vitamin D deficiency; Testosterone deficiency; Obesity (BMI 30.0-34.9); Medication management;  Benign prostatic hyperplasia with urinary frequency; CKD stage 2 due to type 2 diabetes mellitus (HCC); and B12 deficiency on their problem list.  Medications Current Outpatient Medications on File Prior to Visit  Medication Sig  . aspirin 81 MG chewable tablet Chew by mouth daily.  . Cholecalciferol 1000 units capsule Take 5,000 Units by mouth daily.  . empagliflozin (JARDIANCE) 25 MG TABS tablet Take 1 tablet (25 mg total) by mouth daily before breakfast.  . ezetimibe (ZETIA) 10 MG tablet Take 1 tablet (10 mg total) by mouth daily.  07[birth year]1992 glucose blood test strip Use as instructed  . metFORMIN (GLUCOPHAGE XR) 500 MG 24 hr tablet Take 2 tablets twice daily by mouth for diabetes.  . rosuvastatin (CRESTOR) 5 MG tablet Take one tablet every night for cholesterol.  . tamsulosin (FLOMAX) 0.4 MG CAPS capsule Take 1 capsule (0.4 mg total) by mouth daily.  Marland Kitchen telmisartan (MICARDIS) 20 MG tablet Take 1 tablet Daily for BP  . testosterone cypionate (DEPOTESTOSTERONE CYPIONATE) 200 MG/ML injection Inject 2 ml into muscle every 2 weeks  . zinc gluconate 50 MG tablet Take 50 mg by mouth daily.    No current facility-administered medications on file prior to visit.    ROS- see HPI  Physical Exam: Blood pressure 128/76, pulse 71, temperature (!) 97.3 F (36.3 C), weight 221 lb (100.2 kg), SpO2  97 %. Body mass index is 29.97 kg/m. General Appearance: Well nourished, in no apparent distress. Eyes: PERRLA, EOMs, conjunctiva no swelling or erythema ENT/Mouth: Ext aud canals clear, TMs without erythema, bulging. No erythema, swelling, or exudate on post pharynx.  Tonsils not swollen or erythematous. Hearing normal.  Respiratory: Respiratory effort normal, BS equal bilaterally without rales, rhonchi, wheezing or stridor.  Cardio: RRR with no MRGs. Brisk peripheral pulses without edema.  Abdomen: Soft, + BS.  Non tender, no guarding, rebound, hernias, masses. Musculoskeletal: Full ROM, 5/5 strength, normal  gait.  Skin: Warm, dry without rashes, lesions, ecchymosis.  Neuro: Cranial nerves intact. Normal muscle tone, no cerebellar symptoms. Sensation intact.    Plan and Assessment: Amahd was seen today for follow-up.  Diagnoses and all orders for this visit:  Type 2 diabetes mellitus with stage 2 chronic kidney disease, without long-term current use of insulin (HCC) Continue Metformin XR 500mg  two tablets BID, Jardiance 25mg  daily, change to morning. Education: Reviewed 'ABCs' of diabetes management (respective goals in parentheses):  A1C (<7), blood pressure (<130/80), and cholesterol (LDL <70) Eye Exam yearly and Dental Exam every 6 months. Dietary recommendations Physical Activity recommendations - Strongly advised him to start checking sugars at different times of the day - check 2 times a day, rotating checks.  Check fasting daily. - given sugar log and advised how to fill it and to bring it at next appt  - given foot care handout and explained the principles  - given instructions for hypoglycemia management   Primary hypertension Continue current medications: telmisartan 20mg  daily Monitor blood pressure at home; call if consistently over 130/80 Continue DASH diet.   Reminder to go to the ER if any CP, SOB, nausea, dizziness, severe HA, changes vision/speech, left arm numbness and tingling and jaw pain.   Future Appointments  Date Time Provider Department Center  03/22/2020  9:30 AM , NP [facility] None  09/17/2020 11:00 AM 03/24/2020, MD [facility] None

## 2020-01-29 NOTE — Patient Instructions (Addendum)
STOP taking the Pravastatin.  Start taking Rosuvastatin every night.    Take the Jardiance 25mg  every morning.  Take Metformin 500mg , two tablets twice a day with food to reduce stomach upset.  Fasting Blood Sugar should be between 90-120.  If you check after eating your blood glucose should be in 200's.  Keep checking your blood glucose.   If your blood glucose is 400 or higher please contact the office.    Focus on eating lean protein, high-fiber, less processed carbs, fruits(in moderation), and vegetables, low-fat dairy, and healthy vegetable-based fats such as avocado, nuts, canola oil, or olive oil.   Blood sugar control and a healthy weight should be your goal.  A good starting point for a woman is 1,400-1,600 calories a day, with main meals containing up to 30 grams of fiber-rich carbohydrates, and snacks containing 10-20 grams of fiber-rich carbohydrates.  Men are typically more physically active and should start with a 2,000-2,200 calorie meal plan, in which you may increase carbohydrates proportional to your activities.  Recent research suggests that by eating a big breakfast, and a modest lunch, so you get most of your calories in by 3 pm, you will find it easier to lose weight and achieve better blood sugar control.  A high fiber diet--one that contains at least 25 to 35 grams of dietary fiber a day--is essential for good health, and is the key for people with diabetes because fiber helps slow down the absorption of all sugars--those that are naturally forming like in fruits and starches, as well as any refined sugars you consume--in your bloodstream.         Create well-balanced meals (including protein, fat and fiber-rich carbs), they are generally more satisfying.  Doing this decreses chances of being hungry between meals so less likely to look for a quick fix that will cause your blood sugar to soar, and your body to store those unneeded calories as fat.  Good sources  of fiber include:      Pulses (like lentils and peas) and beans and legumes (think black beans, kidney beans, pintos, chickpeas and white beans)      Fruits and vegetables, especially those with edible skin (like apples and beans) and those with edible seeds (like berries)     Nuts--try different kinds (peanuts, walnuts and almonds are a good source of fiber and healthy fat, but watch portion sizes, because they also contain a lot of calories in a small amount!)     Whole grains such as:         Quinoa, barley, brown rice and farro         Whole wheat pasta         Whole grain cereals, including those made from whole wheat, wheat bran and oats   What foods contain heart-healthy fats? These include olive oil and oils made from nuts (eg, walnut oil, peanut oil), avocado, fatty fish l(eg, [place] salmon, mackerel, herring, and [place] trout), nuts and seeds.  The key to a balanced diet is planning meals using the diabetes plate method--divide the plate into quarters:  protein or meat, 1/4 carbs, and 2/4 (=1/2) vegetable and fruit.6 If you want to lose weight, use 9-inch dinner plates and bowls so you aren't piling the food on to a large dinner plate.  For example, fill half the plate with non-starchy carbs such as salad greens or steamed broccoli, and fill the remaining half of the plate with equal portions of a grain  or starchy vegetable like mashed sweet potato, and a heart-healthy protein such as broiled salmon.   Diabetes Plate Method  Check out the site below for more detailed information and examples.  [email or web address]

## 2020-03-01 ENCOUNTER — Other Ambulatory Visit: Payer: Self-pay

## 2020-03-01 ENCOUNTER — Ambulatory Visit (INDEPENDENT_AMBULATORY_CARE_PROVIDER_SITE_OTHER): Payer: Medicare PPO

## 2020-03-01 VITALS — BP 122/74 | HR 70 | Temp 97.5°F | Wt 215.0 lb

## 2020-03-01 DIAGNOSIS — E291 Testicular hypofunction: Secondary | ICD-10-CM

## 2020-03-01 MED ORDER — TESTOSTERONE CYPIONATE 200 MG/ML IM SOLN
200.0000 mg | Freq: Once | INTRAMUSCULAR | Status: AC
  Administered 2020-03-01: 200 mg via INTRAMUSCULAR

## 2020-03-01 NOTE — Progress Notes (Signed)
Patient here for a NV for Testosterone Cypionate 200 mg/ml 2 ml IM injection in right upper outer quadrant. Patient tolerated well. 

## 2020-03-03 ENCOUNTER — Other Ambulatory Visit: Payer: Self-pay | Admitting: Adult Health

## 2020-03-03 ENCOUNTER — Other Ambulatory Visit: Payer: Self-pay

## 2020-03-03 DIAGNOSIS — N182 Chronic kidney disease, stage 2 (mild): Secondary | ICD-10-CM

## 2020-03-03 DIAGNOSIS — E1122 Type 2 diabetes mellitus with diabetic chronic kidney disease: Secondary | ICD-10-CM

## 2020-03-03 DIAGNOSIS — N401 Enlarged prostate with lower urinary tract symptoms: Secondary | ICD-10-CM

## 2020-03-03 MED ORDER — GLUCOSE BLOOD VI STRP: ORAL_STRIP | 4 refills | Status: DC

## 2020-03-07 ENCOUNTER — Other Ambulatory Visit: Payer: Self-pay | Admitting: Adult Health Nurse Practitioner

## 2020-03-07 DIAGNOSIS — E1122 Type 2 diabetes mellitus with diabetic chronic kidney disease: Secondary | ICD-10-CM

## 2020-03-07 DIAGNOSIS — N182 Chronic kidney disease, stage 2 (mild): Secondary | ICD-10-CM

## 2020-03-07 MED ORDER — GLUCOSE BLOOD VI STRP: ORAL_STRIP | 3 refills | Status: DC

## 2020-03-08 ENCOUNTER — Other Ambulatory Visit: Payer: Self-pay | Admitting: Internal Medicine

## 2020-03-08 MED ORDER — BLOOD GLUCOSE MONITOR KIT: PACK | 0 refills | Status: AC

## 2020-03-09 ENCOUNTER — Other Ambulatory Visit: Payer: Self-pay | Admitting: Adult Health Nurse Practitioner

## 2020-03-09 DIAGNOSIS — E1122 Type 2 diabetes mellitus with diabetic chronic kidney disease: Secondary | ICD-10-CM

## 2020-03-09 DIAGNOSIS — N182 Chronic kidney disease, stage 2 (mild): Secondary | ICD-10-CM

## 2020-03-09 MED ORDER — ACCU-CHEK GUIDE ME W/DEVICE KIT: 1.0000 [IU] | PACK | 0 refills | Status: AC

## 2020-03-21 NOTE — Progress Notes (Addendum)
FOLLOW UP 3 MONTH    Assessment / Plan:    Homer was seen today for follow-up.  Diagnoses and all orders for this visit:  Type 2 diabetes mellitus with stage 2 chronic kidney disease, without long-term current use of insulin (HCC) Continue Metformin XR 518m two tablets BID, Jardiance 254mdaily, change to morning. Education: Reviewed ABCs of diabetes management (respective goals in parentheses):  A1C (<7), blood pressure (<130/80), and cholesterol (LDL <70) Eye Exam yearly and Dental Exam every 6 months. Dietary recommendations Physical Activity recommendations - Strongly advised him to start checking sugars at different times of the day - check 2 times a day, rotating checks.  Check fasting daily. - given sugar log and advised how to fill it and to bring it at next appt  - given foot care handout and explained the principles  - given instructions for hypoglycemia management   Essential hypertension Continue current medications: telmisartan 2044maily Monitor blood pressure at home; call if consistently over 130/80 Continue DASH diet.   Reminder to go to the ER if any CP, SOB, nausea, dizziness, severe HA, changes vision/speech, left arm numbness and tingling and jaw pain.  Hyperlipidemia associated with type 2 diabetes mellitus (HCC) Continue medications: Rosuvastatin 5mg37md zetia 10mg75mcussed dietary and exercise modifications Low fat diet   CKD stage 2 due to type 2 diabetes mellitus (HCC) Increase fluids  Avoid NSAIDS Blood pressure control Monitor sugars  Will continue to monitor  Gastroesophageal reflux disease, unspecified whether esophagitis present Continue PPI/H2 blocker, diet discussed  Vitamin D deficiency Continue supplementation to maintain goal of 70-100 Taking Vitamin D 5,000IU daily Defer vitamin D level   Obesity (BMI 30.0-34.9) Discussed dietary and exercise modifications  Medication management Continued  Testosterone  deficiency -Testosterone total Injection received today  Close exposure to COVID19 Antibody test Discussed importance of vaccination related to increased risk factors.   Further disposition pending results if labs check today. Discussed med's effects and SE's.   Over 30 minutes of face to face interview, exam, counseling, chart review, and critical decision making was performed.     Future Appointments  Date Time Provider [place]  9:30 AM McClaGarnet SierrasGA[facility] None  09/17/2020 11:00 AM McKeoUnk PintoGA[facility] None    ___________________________________________________________________________  64 y.13male presents for 3 month follow up on HTN, HLD, T2DM,  He is on bASA, and denies increased appetite, nausea, paresthesia of the feet, polydipsia, polyuria, visual disturbances, vomiting and weight loss.  Last hemoglobin A1c was: Lab Results  Component Value Date   HGBA1C 9.3 (H) 01/15/2020   HGBA1C 8.1 (H) 09/18/2019   HGBA1C 7.9 (H) 06/05/2019    Body mass index is 29.7 kg/m.  Pt is on a regimen of: Glucophage XR 500mg 13mtablet BID and Jardiance  Pt checks his sugars 3 x day  Lowest sugar was 94  100-130's in am.     He has hypoglycemia awareness.  Highest sugar was 180 after lunch.   Glucometer: Freestyle lite  He has stopped drinking diet sodas all together.   He has been watching the foods he eats and making adjustments to his diet. Decrease carbohydrate and sugar intake  Exercise: He walk on the treadmill for 3-5 min. He lifts weight at the gym  Last Hemoglobin A1c 9.3   Patient does have CKD2 He is on ACE/ARB  Lab Results  Component Value Date   GFRAA 80 01/15/2020      Lab Results  Component Value Date   GFRNONAA 69 01/15/2020    Lab Results  Component Value Date   CREATININE 1.12 01/15/2020   BUN 16 01/15/2020   NA 136 01/15/2020   K 4.4 01/15/2020   CL 101 01/15/2020   CO2 28 01/15/2020    Lab  Results  Component Value Date   MICROALBUR 1.2 09/18/2019     He is on a Statin.  He is not at goal of less than 70.  Lab Results  Component Value Date   CHOL 157 01/15/2020   HDL 45 01/15/2020   LDLCALC 81 01/15/2020   TRIG 212 (H) 01/15/2020   CHOLHDL 3.5 01/15/2020     Problem List has Hyperlipidemia associated with type 2 diabetes mellitus ([place]); Hypertension; Type 2 diabetes mellitus ([place]); GERD (gastroesophageal reflux disease); Vitamin D deficiency; Testosterone deficiency; Obesity (BMI 30.0-34.9); Medication management; Benign prostatic hyperplasia with urinary frequency; CKD stage 2 due to type 2 diabetes mellitus ([place]); and B12 deficiency on their problem list.  Medications Current Outpatient Medications on File Prior to Visit  Medication Sig   aspirin 81 MG chewable tablet Chew by mouth daily.   blood glucose meter kit and supplies KIT Dispense based on patient and insurance preference. Use up to four times daily as directed. (FOR ICD-9 250.00, 250.01).   Cholecalciferol 1000 units capsule Take 5,000 Units by mouth daily.   empagliflozin (JARDIANCE) 25 MG TABS tablet Take 1 tablet (25 mg total) by mouth daily before breakfast.   ezetimibe (ZETIA) 10 MG tablet Take 1 tablet (10 mg total) by mouth daily.   glucose blood test strip Use as instructed   glucose blood test strip Check blood glucose before meals & bedtime time and as needed.   metFORMIN (GLUCOPHAGE XR) 500 MG 24 hr tablet Take 2 tablets twice daily by mouth for diabetes.   rosuvastatin (CRESTOR) 5 MG tablet Take one tablet every night for cholesterol.   tamsulosin (FLOMAX) 0.4 MG CAPS capsule Take     1 capsule       at Bedtime        for Prostate   telmisartan (MICARDIS) 20 MG tablet Take 1 tablet Daily for BP   testosterone cypionate (DEPOTESTOSTERONE CYPIONATE) 200 MG/ML injection Inject 2 ml into muscle every 2 weeks   zinc gluconate 50 MG tablet Take 50 mg by mouth daily.    No current  facility-administered medications on file prior to visit.    ROS- see HPI  Physical Exam: Blood pressure 130/78, pulse 63, temperature (!) 97.5 F (36.4 C), weight 219 lb (99.3 kg), SpO2 99 %. Body mass index is 29.7 kg/m. General Appearance: Well nourished, in no apparent distress. Eyes: PERRLA, EOMs, conjunctiva no swelling or erythema ENT/Mouth: Ext aud canals clear, TMs without erythema, bulging. No erythema, swelling, or exudate on post pharynx.  Tonsils not swollen or erythematous. Hearing normal.  Respiratory: Respiratory effort normal, BS equal bilaterally without rales, rhonchi, wheezing or stridor.  Cardio: RRR with no MRGs. Brisk peripheral pulses without edema.  Abdomen: Soft, + BS.  Non tender, no guarding, rebound, hernias, masses. Musculoskeletal: Full ROM, 5/5 strength, normal gait.  Skin: Warm, dry without rashes, lesions, ecchymosis.  Neuro: Cranial nerves intact. Normal muscle tone, no cerebellar symptoms. Sensation intact.     Garnet Sierras, Laqueta Jean, DNP [facility] 03/22/2020  10:00 AM

## 2020-03-22 ENCOUNTER — Encounter: Payer: Self-pay | Admitting: Adult Health Nurse Practitioner

## 2020-03-22 ENCOUNTER — Other Ambulatory Visit: Payer: Self-pay

## 2020-03-22 ENCOUNTER — Ambulatory Visit: Payer: BC Managed Care – PPO | Admitting: Adult Health Nurse Practitioner

## 2020-03-22 VITALS — BP 130/78 | HR 63 | Temp 97.5°F | Wt 219.0 lb

## 2020-03-22 DIAGNOSIS — E1122 Type 2 diabetes mellitus with diabetic chronic kidney disease: Secondary | ICD-10-CM

## 2020-03-22 DIAGNOSIS — Z20822 Contact with and (suspected) exposure to covid-19: Secondary | ICD-10-CM

## 2020-03-22 DIAGNOSIS — N182 Chronic kidney disease, stage 2 (mild): Secondary | ICD-10-CM

## 2020-03-22 DIAGNOSIS — Z79899 Other long term (current) drug therapy: Secondary | ICD-10-CM

## 2020-03-22 DIAGNOSIS — E1169 Type 2 diabetes mellitus with other specified complication: Secondary | ICD-10-CM | POA: Diagnosis not present

## 2020-03-22 DIAGNOSIS — E349 Endocrine disorder, unspecified: Secondary | ICD-10-CM

## 2020-03-22 DIAGNOSIS — K219 Gastro-esophageal reflux disease without esophagitis: Secondary | ICD-10-CM

## 2020-03-22 DIAGNOSIS — E785 Hyperlipidemia, unspecified: Secondary | ICD-10-CM

## 2020-03-22 DIAGNOSIS — I10 Essential (primary) hypertension: Secondary | ICD-10-CM

## 2020-03-22 DIAGNOSIS — E66811 Obesity, class 1: Secondary | ICD-10-CM

## 2020-03-22 DIAGNOSIS — E669 Obesity, unspecified: Secondary | ICD-10-CM

## 2020-03-22 DIAGNOSIS — E559 Vitamin D deficiency, unspecified: Secondary | ICD-10-CM

## 2020-03-22 MED ORDER — TESTOSTERONE CYPIONATE 200 MG/ML IM SOLN
200.0000 mg | INTRAMUSCULAR | Status: DC
  Administered 2020-03-22 – 2021-04-06 (×3): 200 mg via INTRAMUSCULAR

## 2020-03-22 NOTE — Addendum Note (Signed)
Addended by: Rodney Booze D on: 03/22/2020 10:26 AM   Modules accepted: Orders

## 2020-03-24 LAB — COMPLETE METABOLIC PANEL WITH GFR
AG Ratio: 1.8 (calc) (ref 1.0–2.5)
ALT: 29 U/L (ref 9–46)
AST: 25 U/L (ref 10–35)
Albumin: 4.5 g/dL (ref 3.6–5.1)
Alkaline phosphatase (APISO): 93 U/L (ref 35–144)
BUN: 18 mg/dL (ref 7–25)
CO2: 26 mmol/L (ref 20–32)
Calcium: 9.1 mg/dL (ref 8.6–10.3)
Chloride: 104 mmol/L (ref 98–110)
Creat: 1.07 mg/dL (ref 0.70–1.25)
GFR, Est African American: 84 mL/min/{1.73_m2} (ref >=60)
GFR, Est Non African American: 72 mL/min/{1.73_m2} (ref >=60)
Globulin: 2.5 g/dL (calc) (ref 1.9–3.7)
Glucose, Bld: 181 mg/dL — ABNORMAL HIGH (ref 65–99)
Potassium: 4.2 mmol/L (ref 3.5–5.3)
Sodium: 138 mmol/L (ref 135–146)
Total Bilirubin: 0.5 mg/dL (ref 0.2–1.2)
Total Protein: 7 g/dL (ref 6.1–8.1)

## 2020-03-24 LAB — CBC WITH DIFFERENTIAL/PLATELET
Absolute Monocytes: 567 cells/uL (ref 200–950)
Basophils Absolute: 28 cells/uL (ref 0–200)
Basophils Relative: 0.4 %
Eosinophils Absolute: 133 cells/uL (ref 15–500)
Eosinophils Relative: 1.9 %
HCT: 47.2 % (ref 38.5–50.0)
Hemoglobin: 15.6 g/dL (ref 13.2–17.1)
Lymphs Abs: 1680 cells/uL (ref 850–3900)
MCH: 29.3 pg (ref 27.0–33.0)
MCHC: 33.1 g/dL (ref 32.0–36.0)
MCV: 88.6 fL (ref 80.0–100.0)
MPV: 10.9 fL (ref 7.5–12.5)
Monocytes Relative: 8.1 %
Neutro Abs: 4592 cells/uL (ref 1500–7800)
Neutrophils Relative %: 65.6 %
Platelets: 242 10*3/uL (ref 140–400)
RBC: 5.33 10*6/uL (ref 4.20–5.80)
RDW: 12.6 % (ref 11.0–15.0)
Total Lymphocyte: 24 %
WBC: 7 10*3/uL (ref 3.8–10.8)

## 2020-03-24 LAB — LIPID PANEL
Cholesterol: 159 mg/dL (ref <200)
HDL: 40 mg/dL (ref >=40)
LDL Cholesterol (Calc): 88 mg/dL (calc)
Non-HDL Cholesterol (Calc): 119 mg/dL (calc) (ref <130)
Total CHOL/HDL Ratio: 4 (calc) (ref <5.0)
Triglycerides: 214 mg/dL — ABNORMAL HIGH (ref <150)

## 2020-03-24 LAB — HEMOGLOBIN A1C
Hgb A1c MFr Bld: 7.6 % of total Hgb — ABNORMAL HIGH (ref <5.7)
Mean Plasma Glucose: 171 mg/dL
eAG (mmol/L): 9.5 mmol/L

## 2020-03-24 LAB — SARS-COV-2 SEMI-QUANTITATIVE TOTAL ANTIBODY, SPIKE: SARS COV2 AB, Total Spike Semi QN: <0.4 U/mL (ref <0.8)

## 2020-03-24 LAB — TESTOSTERONE: Testosterone: 330 ng/dL (ref 250–827)

## 2020-03-31 ENCOUNTER — Ambulatory Visit (INDEPENDENT_AMBULATORY_CARE_PROVIDER_SITE_OTHER): Payer: Medicare PPO | Admitting: Internal Medicine

## 2020-03-31 ENCOUNTER — Other Ambulatory Visit: Payer: Self-pay

## 2020-03-31 ENCOUNTER — Encounter: Payer: Self-pay | Admitting: Internal Medicine

## 2020-03-31 VITALS — BP 130/84 | HR 71 | Temp 97.3°F | Resp 16 | Ht 72.5 in | Wt 222.6 lb

## 2020-03-31 DIAGNOSIS — J0410 Acute tracheitis without obstruction: Secondary | ICD-10-CM

## 2020-03-31 DIAGNOSIS — Z1152 Encounter for screening for COVID-19: Secondary | ICD-10-CM

## 2020-03-31 DIAGNOSIS — J029 Acute pharyngitis, unspecified: Secondary | ICD-10-CM

## 2020-03-31 LAB — POC COVID19 BINAXNOW: SARS Coronavirus 2 Ag: NEGATIVE

## 2020-03-31 MED ORDER — PROMETHAZINE-DM 6.25-15 MG/5ML PO SYRP
ORAL_SOLUTION | ORAL | 1 refills | Status: DC
Start: 2020-03-31 — End: 2020-04-24

## 2020-03-31 MED ORDER — AZITHROMYCIN 250 MG PO TABS
ORAL_TABLET | ORAL | 1 refills | Status: DC
Start: 2020-03-31 — End: 2020-05-05

## 2020-03-31 MED ORDER — DEXAMETHASONE 4 MG PO TABS: ORAL_TABLET | ORAL | 0 refills | Status: DC

## 2020-03-31 NOTE — Progress Notes (Signed)
   History of Present Illness:    This very nice 65 yo MWM with HTN, HLD, T2_DM presents with a 2 day hx/o  Frontal/maxillary sinus pressure type HA, S/T, productive cough and no fever chills or dyspnea. Covid test was Negative at entry today. Says CBG's are less than 150.  Medications \ .  empagliflozin (JARDIANCE) 25 MG TABS tablet, Take 1 tablet daily before breakfast  .  metFORMIN XR 500 MG 24 hr tablet, Take 2 tablets twice daily by mouth for diabetes.  Marland Kitchen  testosterone cypionate  200 MG/ML injection, Inject 2 ml into muscle every 2 weeks  .  ezetimibe (ZETIA) 10 MG tablet, Take 1 tablet (10 mg total) by mouth daily.  .  rosuvastatin (CRESTOR) 5 MG tablet, Take one tablet every night for cholesterol.  .  telmisartan (MICARDIS) 20 MG tablet, Take 1 tablet Daily for BP   .  aspirin 81 MG chewable tablet, Chew by mouth daily.  .  Vitamin D 000 units capsule, Take 5,000 Units by mouth daily.  .  tamsulosin 0.4 MG CAPS capsule, Take     1 capsule       at Bedtime    .  zinc gluconate 50 MG tablet, Take 50 mg by mouth daily.    Problem list He    Observations/Objective:  BP 130/84   Pulse 71   Temp (!) 97.3 F (36.3 C)   Resp 16   Ht 6' 0.5" (1.842 m)   Wt 222 lb 9.6 oz (101 kg)   SpO2 97%   BMI 29.78 kg/m   HEENT -  EACs / TMs clear. (+) Frontal /Maxilary tenderness. N/O/P - clear Neck - supple.  Chest - Few scattered dry rales . No rhonchi /wheezes.  Cor - Nl HS. RRR w/o sig MGR. PP 1(+). No edema. MS- FROM w/o deformities.  Gait Nl. Neuro -  Nl w/o focal abnormalities.  Assessment and Plan:  1. Tracheitis  - dexamethasone (DECADRON) 4 MG tablet; Take 1 tab 3 x day - 3 days, then 2 x day - 3 days, then 1 tab daily  Dispense: 20 tablet;   - azithromycin (ZITHROMAX) 250 MG tablet; Take 2 tablets with Food on  Day 1, then 1 tablet Daily with Food for Infection  Dispense: 6 each; Refill: 1  - promethazine-dextromethorphan (PROMETHAZINE-DM) 6.25-15 MG/5ML syrup;  Take 1 to 2 tsp enery 4 hours if needed for cough  Dispense: 360 mL; Refill: 1  2. Pharyngitis, unspecified etiology  - dexamethasone (DECADRON) 4 MG tablet; Take 1 tab 3 x day - 3 days, then 2 x day - 3 days, then 1 tab daily  Dispense: 20 tablet;  - azithromycin (ZITHROMAX) 250 MG tablet; Take 2 tablets with Food on  Day 1, then 1 tablet Daily with Food for Infection  Dispense: 6 each; Refill: 1  3. Encounter for screening for COVID-19  - POC COVID-19 - Negative  Follow Up Instructions:       I discussed the assessment and treatment plan with the patient. The patient was provided an opportunity to ask questions and all were answered. The patient agreed with the plan and demonstrated an understanding of the instructions.        The patient was advised to call back or seek an in-person evaluation if the symptoms worsen or if the condition fails to improve as anticipated.   Marinus Maw, MD

## 2020-04-20 ENCOUNTER — Ambulatory Visit (INDEPENDENT_AMBULATORY_CARE_PROVIDER_SITE_OTHER): Payer: Medicare PPO

## 2020-04-20 ENCOUNTER — Other Ambulatory Visit: Payer: Self-pay

## 2020-04-20 DIAGNOSIS — E291 Testicular hypofunction: Secondary | ICD-10-CM

## 2020-04-20 NOTE — Progress Notes (Addendum)
Patient was unable to get injection due to being out of TEST.  Cost of med was $480 (testosterone).

## 2020-04-24 ENCOUNTER — Other Ambulatory Visit: Payer: Self-pay | Admitting: Internal Medicine

## 2020-04-24 DIAGNOSIS — E349 Endocrine disorder, unspecified: Secondary | ICD-10-CM

## 2020-04-24 MED ORDER — TESTOSTERONE CYPIONATE 200 MG/ML IM SOLN: INTRAMUSCULAR | 2 refills | Status: DC

## 2020-04-26 ENCOUNTER — Other Ambulatory Visit: Payer: Self-pay

## 2020-04-26 ENCOUNTER — Ambulatory Visit (INDEPENDENT_AMBULATORY_CARE_PROVIDER_SITE_OTHER): Payer: Medicare PPO

## 2020-04-26 VITALS — BP 130/76 | HR 77 | Temp 98.1°F | Wt 217.8 lb

## 2020-04-26 DIAGNOSIS — E349 Endocrine disorder, unspecified: Secondary | ICD-10-CM

## 2020-04-26 MED ORDER — TESTOSTERONE CYPIONATE 200 MG/ML IM SOLN
200.0000 mg | Freq: Once | INTRAMUSCULAR | Status: AC
  Administered 2020-04-26: 200 mg via INTRAMUSCULAR

## 2020-04-26 NOTE — Progress Notes (Signed)
Patient here for a NV for Testosterone Cypionate 200 mg/ml 2 ml IM injection in right upper outer quadrant. Patient tolerated well.

## 2020-05-05 ENCOUNTER — Ambulatory Visit: Payer: Medicare PPO | Admitting: Adult Health Nurse Practitioner

## 2020-05-05 ENCOUNTER — Other Ambulatory Visit: Payer: Self-pay

## 2020-05-05 ENCOUNTER — Encounter: Payer: Self-pay | Admitting: Adult Health Nurse Practitioner

## 2020-05-05 VITALS — BP 136/90 | HR 80 | Temp 98.2°F | Wt 211.0 lb

## 2020-05-05 DIAGNOSIS — I10 Essential (primary) hypertension: Secondary | ICD-10-CM | POA: Diagnosis not present

## 2020-05-05 DIAGNOSIS — M545 Low back pain, unspecified: Secondary | ICD-10-CM

## 2020-05-05 MED ORDER — TRAMADOL HCL 50 MG PO TABS: 50.0000 mg | ORAL_TABLET | Freq: Four times a day (QID) | ORAL | 0 refills | Status: DC | PRN

## 2020-05-05 MED ORDER — DEXAMETHASONE 2 MG PO TABS: ORAL_TABLET | ORAL | 0 refills | Status: DC

## 2020-05-05 NOTE — Progress Notes (Signed)
Assessment and Plan:  Mark [name] was seen today for back pain.  Diagnoses and all orders for this visit:  Acute bilateral low back pain without sciatica -     Urinalysis w microscopic + reflex cultur -     dexamethasone (DECADRON) 2 MG tablet; Take 1 tab 3 x day - 3 days, then 2 x day - 3 days, then 1 tab daily -     traMADol (ULTRAM) 50 MG tablet; Take 1 tablet (50 mg total) by mouth every 6 (six) hours as needed. Likely MSK rule out kidney stones with UA. Consider imaging if no improvement.  Essential hypertension Continue current medications: Monitor blood pressure at home; call if consistently over 130/80 Continue DASH diet.   Reminder to go to the ER if any CP, SOB, nausea, dizziness, severe HA, changes vision/speech, left arm numbness and tingling and jaw pain.   Contact office with any new or worsening symptoms.    Further disposition pending results of labs. Discussed med's effects and SE's.   Over 30 minutes of exam, counseling, chart review, and critical decision making was performed.   Future Appointments  Date Time Provider [place]  06/28/2020 11:30 AM Garnet Sierras, NP [facility] None  11/02/2020 11:00 AM Unk Pinto, MD [facility] None    ------------------------------------------------------------------------------------------------------------------   HPI 65 y.o.male presents for evaluation of back pain that is bilateral, pointing to flank region.  He reports walking improves it slightly but painful when he takes a step.  Tried putting some heat to the area with some help.  When laying still he reports it hurst the worse.  Sharp pain that is constant 8/10 pain.  He took some tylenol PM and advil, and he applied voltaren    He has had kidney stones in the past, 30+ years ago.  Past Medical History:  Diagnosis Date  . GERD (gastroesophageal reflux disease)   . Hyperlipidemia   . Hypertension   . Other testicular hypofunction   .  Prediabetes   . Vitamin D deficiency      Allergies  Allergen Reactions  . Codeine     REACTION: Anxiety    Current Outpatient Medications on File Prior to Visit  Medication Sig  . aspirin 81 MG chewable tablet Chew by mouth daily.  . blood glucose meter kit and supplies KIT Dispense based on patient and insurance preference. Use up to four times daily as directed. (FOR ICD-9 250.00, 250.01).  . Cholecalciferol 1000 units capsule Take 5,000 Units by mouth daily.  . empagliflozin (JARDIANCE) 25 MG TABS tablet Take 1 tablet (25 mg total) by mouth daily before breakfast.  . ezetimibe (ZETIA) 10 MG tablet Take 1 tablet (10 mg total) by mouth daily.  Marland Kitchen glucose blood test strip Use as instructed  . glucose blood test strip Check blood glucose before meals & bedtime time and as needed.  . metFORMIN (GLUCOPHAGE XR) 500 MG 24 hr tablet Take 2 tablets twice daily by mouth for diabetes.  . rosuvastatin (CRESTOR) 5 MG tablet Take one tablet every night for cholesterol.  . tamsulosin (FLOMAX) 0.4 MG CAPS capsule Take     1 capsule       at Bedtime        for Prostate  . telmisartan (MICARDIS) 20 MG tablet Take 1 tablet Daily for BP  . testosterone cypionate (DEPOTESTOSTERONE CYPIONATE) 200 MG/ML injection Inject 2 ml into muscle every 2 weeks   Current Facility-Administered Medications on File Prior to Visit  Medication  .  testosterone cypionate (DEPOTESTOSTERONE CYPIONATE) injection 200 mg    ROS: all negative except above.   Physical Exam:  BP 136/90   Pulse 80   Temp 98.2 F (36.8 C)   Wt 211 lb (95.7 kg)   SpO2 95%   BMI 28.22 kg/m   General Appearance: Well nourished, in no apparent distress. Eyes: PERRLA, EOMs, conjunctiva no swelling or erythema Sinuses: No Frontal/maxillary tenderness ENT/Mouth: Ext aud canals clear, TMs without erythema, bulging. No erythema, swelling, or exudate on post pharynx.  Tonsils not swollen or erythematous. Hearing normal.  Neck: Supple, thyroid  normal.  Respiratory: Respiratory effort normal, BS equal bilaterally without rales, rhonchi, wheezing or stridor.  Cardio: RRR with no MRGs. Brisk peripheral pulses without edema.  Abdomen: Soft, + BS.  Non tender, no guarding, rebound, hernias, masses. No CVA tenderness. Lymphatics: Non tender without lymphadenopathy.  Musculoskeletal: Full ROM, 5/5 strength, normal gait. Lumbar, L1 point tenderness on left. Skin: Warm, dry without rashes, lesions, ecchymosis.  Neuro: Cranial nerves intact. Normal muscle tone, no cerebellar symptoms. Sensation intact.  Psych: Awake and oriented X 3, normal affect, Insight and Judgment appropriate.     Garnet Sierras, NP 3:24 PM [facility]

## 2020-05-06 LAB — URINALYSIS W MICROSCOPIC + REFLEX CULTURE
Bacteria, UA: NONE SEEN /HPF
Bilirubin Urine: NEGATIVE
Hgb urine dipstick: NEGATIVE
Hyaline Cast: NONE SEEN /LPF
Ketones, ur: NEGATIVE
Leukocyte Esterase: NEGATIVE
Nitrites, Initial: NEGATIVE
Protein, ur: NEGATIVE
RBC / HPF: NONE SEEN /HPF (ref 0–2)
Specific Gravity, Urine: 1.033 (ref 1.001–1.03)
Squamous Epithelial / HPF: NONE SEEN /HPF (ref <=5)
pH: 7 (ref 5.0–8.0)

## 2020-05-06 LAB — NO CULTURE INDICATED

## 2020-05-15 ENCOUNTER — Other Ambulatory Visit: Payer: Self-pay | Admitting: Adult Health Nurse Practitioner

## 2020-05-15 MED ORDER — SULFAMETHOXAZOLE-TRIMETHOPRIM 800-160 MG PO TABS: 1.0000 | ORAL_TABLET | Freq: Two times a day (BID) | ORAL | 0 refills | Status: DC

## 2020-05-17 ENCOUNTER — Telehealth: Payer: Self-pay | Admitting: *Deleted

## 2020-05-17 ENCOUNTER — Other Ambulatory Visit: Payer: Self-pay | Admitting: Internal Medicine

## 2020-05-17 NOTE — Telephone Encounter (Signed)
Returned call to patient regarding difficulty urinating. Per Dr Oneta Rack, increase the Flomax 0.4 mg capsule to 2 capsules daily. If no improvement, patient will need to go to the urologist. Patient I aware.

## 2020-05-25 ENCOUNTER — Ambulatory Visit: Payer: Medicare PPO

## 2020-05-26 ENCOUNTER — Ambulatory Visit (INDEPENDENT_AMBULATORY_CARE_PROVIDER_SITE_OTHER): Payer: Medicare PPO | Admitting: Adult Health

## 2020-05-26 ENCOUNTER — Other Ambulatory Visit: Payer: Self-pay

## 2020-05-26 ENCOUNTER — Encounter: Payer: Self-pay | Admitting: Adult Health

## 2020-05-26 VITALS — BP 126/82 | HR 85 | Temp 97.2°F | Wt 216.0 lb

## 2020-05-26 DIAGNOSIS — R350 Frequency of micturition: Secondary | ICD-10-CM | POA: Diagnosis not present

## 2020-05-26 DIAGNOSIS — R5383 Other fatigue: Secondary | ICD-10-CM

## 2020-05-26 DIAGNOSIS — R6889 Other general symptoms and signs: Secondary | ICD-10-CM

## 2020-05-26 DIAGNOSIS — E538 Deficiency of other specified B group vitamins: Secondary | ICD-10-CM | POA: Diagnosis not present

## 2020-05-26 DIAGNOSIS — Z1211 Encounter for screening for malignant neoplasm of colon: Secondary | ICD-10-CM

## 2020-05-26 DIAGNOSIS — E349 Endocrine disorder, unspecified: Secondary | ICD-10-CM

## 2020-05-26 DIAGNOSIS — R0683 Snoring: Secondary | ICD-10-CM | POA: Diagnosis not present

## 2020-05-26 DIAGNOSIS — N401 Enlarged prostate with lower urinary tract symptoms: Secondary | ICD-10-CM

## 2020-05-26 MED ORDER — FINASTERIDE 5 MG PO TABS: 5.0000 mg | ORAL_TABLET | Freq: Every day | ORAL | 3 refills | Status: DC

## 2020-05-26 MED ORDER — TESTOSTERONE CYPIONATE 200 MG/ML IM SOLN
200.0000 mg | Freq: Once | INTRAMUSCULAR | Status: AC
  Administered 2020-05-26: 200 mg via INTRAMUSCULAR

## 2020-05-26 MED ORDER — TADALAFIL 5 MG PO TABS: 5.0000 mg | ORAL_TABLET | Freq: Every day | ORAL | 1 refills | Status: DC

## 2020-05-26 NOTE — Patient Instructions (Addendum)
Please get on daily subligual B12 (dissolve in the mouth) - [phone] mcg daily   Hold flomax - see if any improvement in fatigue - then start cialis 5 mg daily instead  Start finasteride daily - slowly works over 18-24 months to shrink prostate    If energy levels are not improving in 2 weeks - reach out to me and will send in wellbutrin to take in the morning for mood/motivation and energy benefits  Alternately also think it would be good to rule out sleep apnea   Sleep apnea -   I think it is possible that you have sleep apnea. It can cause interrupted sleep, headaches, frequent awakenings, fatigue, dry mouth, fast/slow heart beats, memory issues, anxiety/depression, swelling, numbness tingling hands/feet, weight gain, shortness of breath, and the list goes on. Sleep apnea needs to be ruled out because if it is left untreated it does eventually lead to abnormal heart beats, lung failure or heart failure as well as increasing the risk of heart attack and stroke. There are masks you can wear OR a mouth piece that I can give you information about. Often times though people feel MUCH better after getting treatment. Please let me know if you change your mind about referral for sleep study.   Sleep Apnea  Sleep apnea is a sleep disorder characterized by abnormal pauses in breathing while you sleep. When your breathing pauses, the level of oxygen in your blood decreases. This causes you to move out of deep sleep and into light sleep. As a result, your quality of sleep is poor, and the system that carries your blood throughout your body (cardiovascular system) experiences stress. If sleep apnea remains untreated, the following conditions can develop:  High blood pressure (hypertension).  Coronary artery disease.  Inability to achieve or maintain an erection (impotence).  Impairment of your thought process (cognitive dysfunction). There are three types of sleep apnea: 1. Obstructive sleep  apnea--Pauses in breathing during sleep because of a blocked airway. 2. Central sleep apnea--Pauses in breathing during sleep because the area of the brain that controls your breathing does not send the correct signals to the muscles that control breathing. 3. Mixed sleep apnea--A combination of both obstructive and central sleep apnea.  RISK FACTORS The following risk factors can increase your risk of developing sleep apnea:  Being overweight.  Smoking.  Having narrow passages in your nose and throat.  Being of older age.  Being male.  Alcohol use.  Sedative and tranquilizer use.  Ethnicity. Among individuals younger than 35 years, African Americans are at increased risk of sleep apnea. SYMPTOMS   Difficulty staying asleep.  Daytime sleepiness and fatigue.  Loss of energy.  Irritability.  Loud, heavy snoring.  Morning headaches.  Trouble concentrating.  Forgetfulness.  Decreased interest in sex. DIAGNOSIS  In order to diagnose sleep apnea, your caregiver will perform a physical examination. Your caregiver may suggest that you take a home sleep test. Your caregiver may also recommend that you spend the night in a sleep lab. In the sleep lab, several monitors record information about your heart, lungs, and brain while you sleep. Your leg and arm movements and blood oxygen level are also recorded. TREATMENT The following actions may help to resolve mild sleep apnea:  Sleeping on your side.   Using a decongestant if you have nasal congestion.   Avoiding the use of depressants, including alcohol, sedatives, and narcotics.   Losing weight and modifying your diet if you are overweight.  There also are devices and treatments to help open your airway:  Oral appliances. These are custom-made mouthpieces that shift your lower jaw forward and slightly open your bite. This opens your airway.  Devices that create positive airway pressure. This positive pressure "splints"  your airway open to help you breathe better during sleep. The following devices create positive airway pressure:  Continuous positive airway pressure (CPAP) device. The CPAP device creates a continuous level of air pressure with an air pump. The air is delivered to your airway through a mask while you sleep. This continuous pressure keeps your airway open.  Nasal expiratory positive airway pressure (EPAP) device. The EPAP device creates positive air pressure as you exhale. The device consists of single-use valves, which are inserted into each nostril and held in place by adhesive. The valves create very little resistance when you inhale but create much more resistance when you exhale. That increased resistance creates the positive airway pressure. This positive pressure while you exhale keeps your airway open, making it easier to breath when you inhale again.  Bilevel positive airway pressure (BPAP) device. The BPAP device is used mainly in patients with central sleep apnea. This device is similar to the CPAP device because it also uses an air pump to deliver continuous air pressure through a mask. However, with the BPAP machine, the pressure is set at two different levels. The pressure when you exhale is lower than the pressure when you inhale.  Surgery. Typically, surgery is only done if you cannot comply with less invasive treatments or if the less invasive treatments do not improve your condition. Surgery involves removing excess tissue in your airway to create a wider passage way. Document Released: 03/03/2002 Document Revised: 07/08/2012 Document Reviewed: 07/20/2011 [facility] Patient Information 2015 [place], [place]. This information is not intended to replace advice given to you by your health care provider. Make sure you discuss any questions you have with your health care provider.      Tadalafil tablets (Cialis) What is this medicine? TADALAFIL (tah DA la fil) is used to treat erection  problems in men. It is also used for enlargement of the prostate gland in men, a condition called benign prostatic hyperplasia or BPH. This medicine improves urine flow and reduces BPH symptoms. This medicine can also treat both erection problems and BPH when they occur together. This medicine may be used for other purposes; ask your health care provider or pharmacist if you have questions. COMMON BRAND NAME(S): Mady Gemma, Cialis What should I tell my health care provider before I take this medicine? They need to know if you have any of these conditions:  bleeding disorders  eye or vision problems, including a rare inherited eye disease called retinitis pigmentosa  anatomical deformation of the penis, Peyronie's disease, or history of priapism (painful and prolonged erection)  heart disease, angina, a history of heart attack, irregular heart beats, or other heart problems  high or low blood pressure  history of blood diseases, like sickle cell anemia or leukemia  history of stomach bleeding  kidney disease  liver disease  stroke  an unusual or allergic reaction to tadalafil, other medicines, foods, dyes, or preservatives  pregnant or trying to get pregnant  breast-feeding How should I use this medicine? Take this medicine by mouth with a glass of water. Follow the directions on the prescription label. You may take this medicine with or without meals. When this medicine is used for erection problems, your doctor may prescribe it  to be taken once daily or as needed. If you are taking the medicine as needed, you may be able to have sexual activity 30 minutes after taking it and for up to 36 hours after taking it. Whether you are taking the medicine as needed or once daily, you should not take more than one dose per day. If you are taking this medicine for symptoms of benign prostatic hyperplasia (BPH) or to treat both BPH and an erection problem, take the dose once daily at about the  same time each day. Do not take your medicine more often than directed. Talk to your pediatrician regarding the use of this medicine in children. Special care may be needed. Overdosage: If you think you have taken too much of this medicine contact a poison control center or emergency room at once. NOTE: This medicine is only for you. Do not share this medicine with others. What if I miss a dose? If you are taking this medicine as needed for erection problems, this does not apply. If you miss a dose while taking this medicine once daily for an erection problem, benign prostatic hyperplasia, or both, take it as soon as you remember, but do not take more than one dose per day. What may interact with this medicine? Do not take this medicine with any of the following medications:  nitrates like amyl nitrite, isosorbide dinitrate, isosorbide mononitrate, nitroglycerin  other medicines for erectile dysfunction like avanafil, sildenafil, vardenafil  other tadalafil products (Adcirca)  riociguat This medicine may also interact with the following medications:  certain drugs for high blood pressure  certain drugs for the treatment of HIV infection or AIDS  certain drugs used for fungal or yeast infections, like fluconazole, itraconazole, ketoconazole, and voriconazole  certain drugs used for seizures like carbamazepine, phenytoin, and phenobarbital  grapefruit juice  macrolide antibiotics like clarithromycin, erythromycin, troleandomycin  medicines for prostate problems  rifabutin, rifampin or rifapentine This list may not describe all possible interactions. Give your health care provider a list of all the medicines, herbs, non-prescription drugs, or dietary supplements you use. Also tell them if you smoke, drink alcohol, or use illegal drugs. Some items may interact with your medicine. What should I watch for while using this medicine? If you notice any changes in your vision while taking  this drug, call your doctor or health care professional as soon as possible. Stop using this medicine and call your health care provider right away if you have a loss of sight in one or both eyes. Contact your doctor or health care professional right away if the erection lasts longer than 4 hours or if it becomes painful. This may be a sign of serious problem and must be treated right away to prevent permanent damage. If you experience symptoms of nausea, dizziness, chest pain or arm pain upon initiation of sexual activity after taking this medicine, you should refrain from further activity and call your doctor or health care professional as soon as possible. Do not drink alcohol to excess (examples, 5 glasses of wine or 5 shots of whiskey) when taking this medicine. When taken in excess, alcohol can increase your chances of getting a headache or getting dizzy, increasing your heart rate or lowering your blood pressure. Using this medicine does not protect you or your partner against HIV infection (the virus that causes AIDS) or other sexually transmitted diseases. What side effects may I notice from receiving this medicine? Side effects that you should report to your doctor  or health care professional as soon as possible:  allergic reactions like skin rash, itching or hives, swelling of the face, lips, or tongue  breathing problems  changes in hearing  changes in vision  chest pain  fast, irregular heartbeat  prolonged or painful erection  seizures Side effects that usually do not require medical attention (report to your doctor or health care professional if they continue or are bothersome):  back pain  dizziness  flushing  headache  indigestion  muscle aches  nausea  stuffy or runny nose This list may not describe all possible side effects. Call your doctor for medical advice about side effects. You may report side effects to FDA at 1-800-FDA-1088. Where should I keep my  medicine? Keep out of the reach of children. Store at room temperature between 15 and 30 degrees C (59 and 86 degrees F). Throw away any unused medicine after the expiration date. NOTE: This sheet is a summary. It may not cover all possible information. If you have questions about this medicine, talk to your doctor, pharmacist, or health care provider.  2021 Elsevier/Gold Standard (2013-08-01 13:15:49)        Finasteride (Proscar) Oral Tablets What is this medicine? FINASTERIDE (fi NAS teer ide) is used to treat benign prostatic hyperplasia (BPH) in men. This is a condition that causes you to have an enlarged prostate. This medicine helps to control your symptoms, decrease urinary retention, and reduces your risk of needing surgery. When used in combination with certain other medicines, this drug can slow down the progression of your disease. This medicine may be used for other purposes; ask your health care provider or pharmacist if you have questions. COMMON BRAND NAME(S): Proscar What should I tell my health care provider before I take this medicine? They need to know if you have any of these conditions:  liver disease  an unusual or allergic reaction to finasteride, other medicines, foods, dyes, or preservatives  pregnant or trying to get pregnant  breast-feeding How should I use this medicine? Take this medicine by mouth with water. Take it as directed on the prescription label at the same time every day. You can take it with or without food. If it upsets your stomach, take it with food. Keep taking it unless your health care provider tells you to stop. Talk to your health care provider about the use of this medicine in children. Special care may be needed. Overdosage: If you think you have taken too much of this medicine contact a poison control center or emergency room at once. NOTE: This medicine is only for you. Do not share this medicine with others. What if I miss a  dose? If you miss a dose, take it as soon as you can. If it is almost time for your next dose, take only that dose. Do not take double or extra doses. What may interact with this medicine?  saw palmetto or other dietary supplements This list may not describe all possible interactions. Give your health care provider a list of all the medicines, herbs, non-prescription drugs, or dietary supplements you use. Also tell them if you smoke, drink alcohol, or use illegal drugs. Some items may interact with your medicine. What should I watch for while using this medicine? Visit your health care provider for regular checks on your progress. It may be some time before you see the benefit from this medicine. You may need blood work while you are taking this medicine. For example, your health  care provider may have you take a blood test called PSA for the screening of prostate cancer. Make sure your health care provider knows you are taking this medicine before you take a PSA test. Talk to your health care provider about your risk of cancer. You may be more at risk for certain types of cancer if you take this medicine. Do not donate blood while you are taking this medicine. This will prevent giving this medicine to a pregnant male through a blood transfusion. Ask your doctor or health care professional when it is safe to donate blood after you stop taking this medicine. Women who are pregnant or may get pregnant must not handle broken or crushed finasteride tablets. The active ingredient could harm the unborn baby. If a pregnant woman comes into contact with broken or crushed tablets she should check with her health care provider. Exposure to whole tablets is not expected to cause harm as long as they are not swallowed. What side effects may I notice from receiving this medicine? Side effects that you should report to your doctor or health care professional as soon as possible:  allergic reactions (skin rash,  itching or hives; swelling of the face, lips, or tongue)  breast discharge  breast swelling, tenderness  depressed mood  pain in the testicles Side effects that usually do not require medical attention (report to your doctor or health care professional if they continue or are bothersome):  blood in semen  changes in sex drive or performance  reduced amount of semen released during sex This list may not describe all possible side effects. Call your doctor for medical advice about side effects. You may report side effects to FDA at 1-800-FDA-1088. Where should I keep my medicine? Keep out of the reach of children and pets. Store at room temperature below 30 degrees C (86 degrees F). Protect from light. Keep the container tightly closed. Get rid of any unused medicine after the expiration date. To get rid of medicines that are no longer needed or have expired:  Take the medicine to a medicine take-back program. Check with your pharmacy or law enforcement to find a location.  If you cannot return the medicine, ask your pharmacist or health care provider how to get rid of this medicine safely. NOTE: This sheet is a summary. It may not cover all possible information. If you have questions about this medicine, talk to your doctor, pharmacist, or health care provider.  2021 Elsevier/Gold Standard (2019-11-26 10:54:36)       Bupropion tablets (Depression/Mood Disorders) What is this medicine? BUPROPION (byoo PROE pee on) is used to treat depression. This medicine may be used for other purposes; ask your health care provider or pharmacist if you have questions. COMMON BRAND NAME(S): Wellbutrin What should I tell my health care provider before I take this medicine? They need to know if you have any of these conditions:  an eating disorder, such as anorexia or bulimia  bipolar disorder or psychosis  diabetes or high blood sugar, treated with medication  glaucoma  heart disease,  previous heart attack, or irregular heart beat  head injury or brain tumor  high blood pressure  kidney or liver disease  seizures  suicidal thoughts or a previous suicide attempt  Tourette's syndrome  weight loss  an unusual or allergic reaction to bupropion, other medicines, foods, dyes, or preservatives  breast-feeding  pregnant or trying to become pregnant How should I use this medicine? Take this medicine by mouth  with a glass of water. Follow the directions on the prescription label. You can take it with or without food. If it upsets your stomach, take it with food. Take your medicine at regular intervals. Do not take your medicine more often than directed. Do not stop taking this medicine suddenly except upon the advice of your doctor. Stopping this medicine too quickly may cause serious side effects or your condition may worsen. A special MedGuide will be given to you by the pharmacist with each prescription and refill. Be sure to read this information carefully each time. Talk to your pediatrician regarding the use of this medicine in children. Special care may be needed. Overdosage: If you think you have taken too much of this medicine contact a poison control center or emergency room at once. NOTE: This medicine is only for you. Do not share this medicine with others. What if I miss a dose? If you miss a dose, take it as soon as you can. If it is less than four hours to your next dose, take only that dose and skip the missed dose. Do not take double or extra doses. What may interact with this medicine? Do not take this medicine with any of the following medications:  linezolid  MAOIs like Azilect, Carbex, Eldepryl, Marplan, Nardil, and Parnate  methylene blue (injected into a vein)  other medicines that contain bupropion like Zyban This medicine may also interact with the following medications:  alcohol  certain medicines for anxiety or sleep  certain medicines  for blood pressure like metoprolol, propranolol  certain medicines for depression or psychotic disturbances  certain medicines for HIV or AIDS like efavirenz, lopinavir, nelfinavir, ritonavir  certain medicines for irregular heart beat like propafenone, flecainide  certain medicines for Parkinson's disease like amantadine, levodopa  certain medicines for seizures like carbamazepine, phenytoin, phenobarbital  cimetidine  clopidogrel  cyclophosphamide  digoxin  furazolidone  isoniazid  nicotine  orphenadrine  procarbazine  steroid medicines like prednisone or cortisone  stimulant medicines for attention disorders, weight loss, or to stay awake  tamoxifen  theophylline  thiotepa  ticlopidine  tramadol  warfarin This list may not describe all possible interactions. Give your health care provider a list of all the medicines, herbs, non-prescription drugs, or dietary supplements you use. Also tell them if you smoke, drink alcohol, or use illegal drugs. Some items may interact with your medicine. What should I watch for while using this medicine? Tell your doctor if your symptoms do not get better or if they get worse. Visit your doctor or healthcare provider for regular checks on your progress. Because it may take several weeks to see the full effects of this medicine, it is important to continue your treatment as prescribed by your doctor. This medicine may cause serious skin reactions. They can happen weeks to months after starting the medicine. Contact your healthcare provider right away if you notice fevers or flu-like symptoms with a rash. The rash may be red or purple and then turn into blisters or peeling of the skin. Or, you might notice a red rash with swelling of the face, lips or lymph nodes in your neck or under your arms. Patients and their families should watch out for new or worsening thoughts of suicide or depression. Also watch out for sudden changes in  feelings such as feeling anxious, agitated, panicky, irritable, hostile, aggressive, impulsive, severely restless, overly excited and hyperactive, or not being able to sleep. If this happens, especially at the beginning  of treatment or after a change in dose, call your healthcare provider. Avoid alcoholic drinks while taking this medicine. Drinking excessive alcoholic beverages, using sleeping or anxiety medicines, or quickly stopping the use of these agents while taking this medicine may increase your risk for a seizure. Do not drive or use heavy machinery until you know how this medicine affects you. This medicine can impair your ability to perform these tasks. Do not take this medicine close to bedtime. It may prevent you from sleeping. Your mouth may get dry. Chewing sugarless gum or sucking hard candy, and drinking plenty of water may help. Contact your doctor if the problem does not go away or is severe. What side effects may I notice from receiving this medicine? Side effects that you should report to your doctor or health care professional as soon as possible:  allergic reactions like skin rash, itching or hives, swelling of the face, lips, or tongue  breathing problems  changes in vision  confusion  elevated mood, decreased need for sleep, racing thoughts, impulsive behavior  fast or irregular heartbeat  hallucinations, loss of contact with reality  increased blood pressure  rash, fever, and swollen lymph nodes  redness, blistering, peeling, or loosening of the skin, including inside the mouth  seizures  suicidal thoughts or other mood changes  unusually weak or tired  vomiting Side effects that usually do not require medical attention (report to your doctor or health care professional if they continue or are bothersome):  constipation  headache  loss of appetite  nausea  tremors  weight loss This list may not describe all possible side effects. Call your  doctor for medical advice about side effects. You may report side effects to FDA at 1-800-FDA-1088. Where should I keep my medicine? Keep out of the reach of children. Store at room temperature between 20 and 25 degrees C (68 and 77 degrees F), away from direct sunlight and moisture. Keep tightly closed. Throw away any unused medicine after the expiration date. NOTE: This sheet is a summary. It may not cover all possible information. If you have questions about this medicine, talk to your doctor, pharmacist, or health care provider.  2021 Elsevier/Gold Standard (2018-06-06 14:02:47)

## 2020-05-26 NOTE — Progress Notes (Addendum)
Patient presents to the office for a Testosterone Cypionate 200 mg/ml 2 ml IM injection in upper LEFT outer quadrant. Patient tolerated well. 

## 2020-05-26 NOTE — Progress Notes (Signed)
Assessment and Plan:  Easter was seen today for lack of energy.  Diagnoses and all orders for this visit:  Fatigue, unspecified type Possibly multifactorial; unclear etiology He correlates with increased tamsulosin which may cause somnolence - will try stopping and do finasteride and tadalafil instead;  Do step wise change - stop tamsulosin and start tadalafil/finasteride - Has had normal labs other than low B12 last year - after 2 weeks start daily SL supplement UTD on cancer screening other than colonoscopy - referral placed No other specific sx, no red flags Also poor motivation/possible depression - if not improving he will call back and start wellbutrin Snoring, AM HA, dry mouth - possible sleep apnea - discussed but declines referral for sleep study at this time, would consider if not improving  Stress management techniques discussed, increase water, good sleep hygiene discussed, increase exercise, and increase veggies.    Benign prostatic hyperplasia with urinary frequency -     tadalafil (CIALIS) 5 MG tablet; Take 1 tablet (5 mg total) by mouth daily. -     finasteride (PROSCAR) 5 MG tablet; Take 1 tablet (5 mg total) by mouth daily.  Testosterone deficiency -     testosterone cypionate (DEPOTESTOSTERONE CYPIONATE) injection 200 mg  Screening for colon cancer -     Ambulatory referral to Gastroenterology  Further disposition pending results of labs. Discussed med's effects and SE's.   Over 30 minutes of exam, counseling, chart review, and critical decision making was performed.   Future Appointments  Date Time Provider [place]  06/28/2020 11:30 AM Garnet Sierras, NP [facility] None  66/11/2020 11:00 AM Unk Pinto, MD [facility] None    ------------------------------------------------------------------------------------------------------------------   HPI BP 126/82   Pulse 85   Temp (!) 97.2 F (36.2 C)   Wt 216 lb (98 kg)   SpO2 98%   BMI 28.89  kg/m   65 y.o.male with hx of htn, T2DM, hypogonadism, BPH, B12 def presents for evaluation of fatigue.   He reports noted sx about 6-8 months ago; He reports wakes up tired, sleeps well when he isn't up all night urinating (BPH, does stop drinking around 5 pm). He feels this started when he started taking tamsulosin daily for BPH. He has not tried stopping med or other therapies. He reports off of med has dribbing, sense of incomplete bladder emptying, and getting up to urinate hourly.   He denies any other specific sx/concerns.   He does snore, very mild, no witnessed apneic episodes. Does endorse AM headaches x 1 year, reports wakes up with very dry mouth. Denies sore throat. He denies daytime somnolence, doesn't fall sleep when watching TV or driving.   He also describes very poor motivation/energy to do things. No hx of depression, but does score PHQ-9 of 9 today, specific to motivation, fatigue.   Depression screen [facility] 2/9 66/04/2020 09/18/2019 09/29/2015  Decreased Interest 3 0 0  Down, Depressed, Hopeless 0 0 0  PHQ - 2 Score 3 0 0  Altered sleeping 3 - -  Tired, decreased energy 3 - -  Change in appetite 0 - -  Feeling bad or failure about yourself  0 - -  Trouble concentrating 0 - -  Moving slowly or fidgety/restless 0 - -  Suicidal thoughts 0 - -  PHQ-9 Score 9 - -  Difficult doing work/chores Somewhat difficult - -   PSAs have been normal Lab Results  Component Value Date   PSA 1.5 09/18/2019   PSA 2.3 08/27/2018  PSA 1.4 10/04/2016   He reports fasting running around 100 in AM; denies polyuria/polydipsia, vision changes, weight loss Lab Results  Component Value Date   HGBA1C 7.6 (H) 03/22/2020   He has known B12 def <400, has been recommended supplement but admits never started, willing to start Lab Results  Component Value Date   VITAMINB12 292 09/18/2019   He has a history of testosterone deficiency and is on testosterone replacement for many years. He states  that the testosterone helps with his energy, libido, muscle mass. He reports shot helps with energy, but doesn't fully resolve. Taking 200 mg q2w, due for injection today.  Lab Results  Component Value Date   TESTOSTERONE 330 03/22/2020    He has had several labs since onset of sx; normal CBC, CMP/GFR, TSH, UA/micro. Never smoker.  He is due for colonoscopy, last 2011 with 1 polyp, was due in 2021.    BMI is Body mass index is 28.89 kg/m., Denies unintended weight loss, denies alcohol, drug or tobacco use/ hx.  Wt Readings from Last 3 Encounters:  05/26/20 216 lb (98 kg)  05/05/20 211 lb (95.7 kg)  04/26/20 217 lb 12.8 oz (98.8 kg)     Past Medical History:  Diagnosis Date  . GERD (gastroesophageal reflux disease)   . Hyperlipidemia   . Hypertension   . Other testicular hypofunction   . Prediabetes   . Vitamin D deficiency      Allergies  Allergen Reactions  . Codeine     REACTION: Anxiety    Current Outpatient Medications on File Prior to Visit  Medication Sig  . aspirin 81 MG chewable tablet Chew by mouth daily.  . Cholecalciferol 1000 units capsule Take 5,000 Units by mouth daily.  . empagliflozin (JARDIANCE) 25 MG TABS tablet Take 1 tablet (25 mg total) by mouth daily before breakfast.  . ezetimibe (ZETIA) 10 MG tablet Take 1 tablet (10 mg total) by mouth daily.  . metFORMIN (GLUCOPHAGE XR) 500 MG 24 hr tablet Take 2 tablets twice daily by mouth for diabetes.  Marland Kitchen telmisartan (MICARDIS) 20 MG tablet Take 1 tablet Daily for BP  . testosterone cypionate (DEPOTESTOSTERONE CYPIONATE) 200 MG/ML injection Inject 2 ml into muscle every 2 weeks  . blood glucose meter kit and supplies KIT Dispense based on patient and insurance preference. Use up to four times daily as directed. (FOR ICD-9 250.00, 250.01).  Marland Kitchen glucose blood test strip Use as instructed  . glucose blood test strip Check blood glucose before meals & bedtime time and as needed.  . rosuvastatin (CRESTOR) 5 MG  tablet Take one tablet every night for cholesterol. (Patient not taking: Reported on 05/26/2020)   Current Facility-Administered Medications on File Prior to Visit  Medication  . testosterone cypionate (DEPOTESTOSTERONE CYPIONATE) injection 200 mg    Allergies:  Allergies  Allergen Reactions  . Codeine     REACTION: Anxiety   Surgical History:  He  has a past surgical history that includes Vasectomy (01/2004) and Urethral dilation (2007). Family History:  Hisfamily history includes COPD in his father; Heart disease in his father; Hyperlipidemia in his mother; Hypertension in his father and mother. Social History:   reports that he has never smoked. He has never used smokeless tobacco. He reports that he does not drink alcohol and does not use drugs.   ROS: Review of Systems  Constitutional: Positive for malaise/fatigue. Negative for chills, diaphoresis, fever and weight loss.  HENT: Negative for congestion, hearing loss, sore throat and  tinnitus.        Dry mouth AM  Eyes: Negative.   Respiratory: Negative for cough, hemoptysis, sputum production, shortness of breath and wheezing.   Cardiovascular: Negative for chest pain, palpitations, orthopnea, leg swelling and PND.  Gastrointestinal: Negative.  Negative for abdominal pain, blood in stool, constipation, melena, nausea and vomiting.  Genitourinary: Positive for frequency. Negative for dysuria, flank pain, hematuria and urgency.       Nocturnal frequency when not taking tamsulosin, straining, dribbling  Musculoskeletal: Negative.  Negative for back pain, joint pain and myalgias.  Skin: Negative for rash.  Neurological: Negative for dizziness, tingling, tremors, sensory change, focal weakness, weakness and headaches.  Endo/Heme/Allergies: Negative for environmental allergies and polydipsia. Does not bruise/bleed easily.  Psychiatric/Behavioral: Negative for depression, memory loss, substance abuse and suicidal ideas. The patient is  not nervous/anxious and does not have insomnia.        Poor motivation     Physical Exam:  BP 126/82   Pulse 85   Temp (!) 97.2 F (36.2 C)   Wt 216 lb (98 kg)   SpO2 98%   BMI 28.89 kg/m   General Appearance: Well nourished, in no apparent distress. Eyes: PERRLA, EOMs, conjunctiva no swelling or erythema Sinuses: No Frontal/maxillary tenderness ENT/Mouth: Ext aud canals clear, TMs without erythema, bulging. No erythema, swelling, or exudate on post pharynx.  Tonsils not swollen or erythematous. Hearing normal.  Neck: Supple, thyroid normal.  Respiratory: Respiratory effort normal, BS equal bilaterally without rales, rhonchi, wheezing or stridor.  Cardio: RRR with no MRGs. Brisk peripheral pulses without edema.  Abdomen: Soft, + BS.  Non tender, no guarding, rebound, hernias, masses. Lymphatics: Non tender without lymphadenopathy.  Musculoskeletal: Full ROM, 5/5 strength, normal gait.  Skin: Warm, dry without rashes, lesions, ecchymosis.  Neuro: Cranial nerves intact. Normal muscle tone, no cerebellar symptoms. Sensation intact.  Psych: Awake and oriented X 3, normal affect, Insight and Judgment appropriate.     Izora Ribas, NP 12:44 PM [facility]

## 2020-06-07 ENCOUNTER — Other Ambulatory Visit: Payer: Self-pay | Admitting: Adult Health

## 2020-06-07 DIAGNOSIS — N182 Chronic kidney disease, stage 2 (mild): Secondary | ICD-10-CM

## 2020-06-07 MED ORDER — GLUCOSE BLOOD VI STRP
ORAL_STRIP | 3 refills | Status: DC
Start: 2020-06-07 — End: 2021-02-28

## 2020-06-14 ENCOUNTER — Other Ambulatory Visit: Payer: Self-pay | Admitting: Internal Medicine

## 2020-06-14 DIAGNOSIS — N401 Enlarged prostate with lower urinary tract symptoms: Secondary | ICD-10-CM

## 2020-06-28 ENCOUNTER — Ambulatory Visit: Payer: Medicare PPO | Admitting: Adult Health Nurse Practitioner

## 2020-07-06 ENCOUNTER — Ambulatory Visit (INDEPENDENT_AMBULATORY_CARE_PROVIDER_SITE_OTHER): Payer: Medicare PPO | Admitting: Adult Health

## 2020-07-06 ENCOUNTER — Other Ambulatory Visit: Payer: Self-pay

## 2020-07-06 ENCOUNTER — Encounter: Payer: Self-pay | Admitting: Adult Health

## 2020-07-06 VITALS — BP 122/78 | HR 85 | Temp 97.7°F | Wt 221.0 lb

## 2020-07-06 DIAGNOSIS — E349 Endocrine disorder, unspecified: Secondary | ICD-10-CM | POA: Diagnosis not present

## 2020-07-06 DIAGNOSIS — R350 Frequency of micturition: Secondary | ICD-10-CM | POA: Diagnosis not present

## 2020-07-06 DIAGNOSIS — N401 Enlarged prostate with lower urinary tract symptoms: Secondary | ICD-10-CM

## 2020-07-06 DIAGNOSIS — M25562 Pain in left knee: Secondary | ICD-10-CM

## 2020-07-06 MED ORDER — TESTOSTERONE CYPIONATE 200 MG/ML IM SOLN: INTRAMUSCULAR | 2 refills | Status: DC

## 2020-07-06 MED ORDER — MELOXICAM 15 MG PO TABS
ORAL_TABLET | ORAL | 0 refills | Status: DC
Start: 2020-07-06 — End: 2020-07-29

## 2020-07-06 MED ORDER — FINASTERIDE 5 MG PO TABS: 5.0000 mg | ORAL_TABLET | Freq: Every day | ORAL | 3 refills | Status: DC

## 2020-07-06 NOTE — Progress Notes (Signed)
Assessment and Plan:  Mark [name] was seen today for knee injury.  Diagnoses and all orders for this visit:  Acute pain of left knee Improving significantly from yesterday; low suspicion of any fracture Natural history and expected course discussed. Questions answered. Rest, ice, compression, and elevation (RICE) therapy. NSAIDs per medication orders. Follow up if any persistent pain, any heat/erythema of superficial scab over knee, or other worsening sx -     meloxicam (MOBIC) 15 MG tablet; Take one daily with food for 2 weeks, can take with tylenol, can not take with aleve, iburpofen, then as needed daily for pain  Testosterone deficiency PDMP checked and reordered; schedule NV tomorrow for injection -     testosterone cypionate (DEPOTESTOSTERONE CYPIONATE) 200 MG/ML injection; Inject 2 ml into muscle every 2 weeks  Benign prostatic hyperplasia with urinary frequency -     finasteride (PROSCAR) 5 MG tablet; Take 1 tablet (5 mg total) by mouth daily.  Further disposition pending results of labs. Discussed med's effects and SE's.   Over 15 minutes of exam, counseling, chart review, and critical decision making was performed.   Future Appointments  Date Time Provider [place]  07/29/2020  4:00 PM Liane Comber, NP [facility] None  11/02/2020 11:00 AM Unk Pinto, MD [facility] None    ------------------------------------------------------------------------------------------------------------------   HPI BP 122/78   Pulse 85   Temp 97.7 F (36.5 C)   Wt 221 lb (100.2 kg)   SpO2 97%   BMI 29.56 kg/m   65 y.o.male presents for evaluation of acute left knee pain. Also due for testosterone injection today but out of med.   He was cleaning out garage yesterday, car part fell off and hit anterior right knee, immediate pain and "took my leg out." He iced every 2 hours yesterday, took advil 400 mg twice that did help some, was quickly able to bear weight, much improved this  AM.   Today without pain unless with weight bearing, worse after long period of rest and improves after he get's going.   Reports fasting glucose recently <130 Lab Results  Component Value Date   HGBA1C 7.6 (H) 03/22/2020   Lab Results  Component Value Date   GFRNONAA 72 03/22/2020       Past Medical History:  Diagnosis Date  . GERD (gastroesophageal reflux disease)   . Hyperlipidemia   . Hypertension   . Other testicular hypofunction   . Prediabetes   . Vitamin D deficiency      Allergies  Allergen Reactions  . Codeine     REACTION: Anxiety    Current Outpatient Medications on File Prior to Visit  Medication Sig  . blood glucose meter kit and supplies KIT Dispense based on patient and insurance preference. Use up to four times daily as directed. (FOR ICD-9 250.00, 250.01).  . Cholecalciferol 1000 units capsule Take 5,000 Units by mouth daily.  . Cyanocobalamin (VITAMIN B-12 PO) Take by mouth daily.  Marland Kitchen glucose blood test strip Check blood glucose before meals & bedtime time and as needed.  . metFORMIN (GLUCOPHAGE XR) 500 MG 24 hr tablet Take 2 tablets twice daily by mouth for diabetes.  . tadalafil (CIALIS) 5 MG tablet Take 1 tablet (5 mg total) by mouth daily.  . tamsulosin (FLOMAX) 0.4 MG CAPS capsule TAKE 1 CAPSULE BY MOUTH AT BEDTIME FOR PROSTATE  . telmisartan (MICARDIS) 20 MG tablet Take 1 tablet Daily for BP  . testosterone cypionate (DEPOTESTOSTERONE CYPIONATE) 200 MG/ML injection Inject 2 ml into muscle  every 2 weeks  . aspirin 81 MG chewable tablet Chew by mouth daily. (Patient not taking: Reported on 07/06/2020)  . empagliflozin (JARDIANCE) 25 MG TABS tablet Take 1 tablet (25 mg total) by mouth daily before breakfast. (Patient not taking: Reported on 07/06/2020)  . ezetimibe (ZETIA) 10 MG tablet Take 1 tablet (10 mg total) by mouth daily. (Patient not taking: Reported on 07/06/2020)  . finasteride (PROSCAR) 5 MG tablet Take 1 tablet (5 mg total) by mouth daily.  (Patient not taking: Reported on 07/06/2020)  . rosuvastatin (CRESTOR) 5 MG tablet Take one tablet every night for cholesterol. (Patient not taking: No sig reported)   Current Facility-Administered Medications on File Prior to Visit  Medication  . testosterone cypionate (DEPOTESTOSTERONE CYPIONATE) injection 200 mg    ROS: all negative except above.   Physical Exam:  BP 122/78   Pulse 85   Temp 97.7 F (36.5 C)   Wt 221 lb (100.2 kg)   SpO2 97%   BMI 29.56 kg/m   General Appearance: Well nourished, in no apparent distress. Eyes: conjunctiva no swelling or erythema ENT/Mouth: Mask in place; Hearing normal.  Neck: Supple Respiratory: Respiratory effort normal.  Cardio: Appears well perfused. Brisk peripheral pulses without edema.  Musculoskeletal: Full ROM, 5/5 strength, normal gait. He has mild left patellar tenderness without palpable bony abnormality, good mobility; full knee ROM intact without popping, clicking, laxity with figure 8 or strain; very mild local soft tissue swelling without notable effusion in joint.   Skin: Warm, dry; small 2 cm scab to left knee over patella, minimal surrounding erythema.  Neuro:  Sensation intact.  Psych: Awake and oriented X 3, normal affect, Insight and Judgment appropriate.     Izora Ribas, NP 10:44 AM [facility]

## 2020-07-20 ENCOUNTER — Other Ambulatory Visit: Payer: Self-pay

## 2020-07-20 ENCOUNTER — Other Ambulatory Visit: Payer: Self-pay | Admitting: Adult Health

## 2020-07-20 ENCOUNTER — Ambulatory Visit (INDEPENDENT_AMBULATORY_CARE_PROVIDER_SITE_OTHER): Payer: Medicare PPO

## 2020-07-20 VITALS — BP 124/76 | HR 71 | Temp 97.3°F | Ht 72.5 in | Wt 220.0 lb

## 2020-07-20 DIAGNOSIS — E349 Endocrine disorder, unspecified: Secondary | ICD-10-CM

## 2020-07-20 DIAGNOSIS — E1122 Type 2 diabetes mellitus with diabetic chronic kidney disease: Secondary | ICD-10-CM

## 2020-07-20 MED ORDER — TESTOSTERONE CYPIONATE 200 MG/ML IM SOLN
200.0000 mg | Freq: Once | INTRAMUSCULAR | Status: AC
  Administered 2020-07-20: 200 mg via INTRAMUSCULAR

## 2020-07-20 NOTE — Progress Notes (Signed)
Patient presents to the office for a Testosterone Cypionate 200 mg/ml 2 ml IM injection in upper RIGHT outer quadrant. Patient tolerated well. 

## 2020-07-28 DIAGNOSIS — Z86010 Personal history of colonic polyps: Secondary | ICD-10-CM | POA: Insufficient documentation

## 2020-07-28 NOTE — Progress Notes (Signed)
MEDICARE ANNUAL WELLNESS VISIT AND FOLLOW UP Assessment:   Mark [name] was seen today for medicare wellness and follow-up.  Diagnoses and all orders for this visit:  Welcome to Medicare preventive visit Due annually; declines vaccines today  -     EKG 12-Lead -     [place], RETROPERITNL ABD,  LTD  Primary hypertension Continue medication Monitor blood pressure at home; call if consistently over 130/80 Continue DASH diet.   Reminder to go to the ER if any CP, SOB, nausea, dizziness, severe HA, changes vision/speech, left arm numbness and tingling and jaw pain. -     CBC with Differential/Platelet -     COMPLETE METABOLIC PANEL WITH GFR -     Magnesium  Type 2 diabetes mellitus with stage 2 chronic kidney disease, without long-term current use of insulin [facility]) Education: Reviewed 'ABCs' of diabetes management (respective goals in parentheses):  A1C (<7), blood pressure (<130/80), and cholesterol (LDL <70) Eye Exam yearly (report requested) and Dental Exam every 6 months. Dietary recommendations Physical Activity recommendations -     COMPLETE METABOLIC PANEL WITH GFR -     Hemoglobin A1c  Hyperlipidemia associated with type 2 diabetes mellitus (HCC) Continue medications: newly taking rosuvastatin 5 mg daily LDL goal <70, titrate for goal  Continue low cholesterol diet and exercise.  Check lipid panel.  -     Lipid panel -     TSH  CKD stage 2 due to type 2 diabetes mellitus (HCC) Increase fluids, avoid NSAIDS, monitor sugars, will monitor On ARB, jardiance -     COMPLETE METABOLIC PANEL WITH GFR  Gastroesophageal reflux disease, unspecified whether esophagitis present Recently controlled off of meds; lifestyle reviewed   B12 deficiency Continue supplement;   Vitamin D deficiency -     VITAMIN D 25 Hydroxy (Vit-D Deficiency, Fractures)  Obesity (BMI 30.0-34.9) Long discussion about weight loss, diet, and exercise Recommended diet heavy in fruits and veggies and low in animal  meats, cheeses, and dairy products, appropriate calorie intake Discussed appropriate weight for height and initial goal (<210lb ) Follow up at next visit  Benign prostatic hyperplasia with urinary frequency Continue meds; well controlled; check PSA annually at CPE  Testosterone deficiency Continued perceived benefits with supplementation, no SE. Check level PRN, continue zinc, no symptoms of elevated estrogen or dihydrotestosterone.   Medication management -     CBC with Differential/Platelet -     COMPLETE METABOLIC PANEL WITH GFR -     Magnesium  History of colon polyps -     Ambulatory referral to Gastroenterology  Over 30 minutes of exam, counseling, chart review, and critical decision making was performed  Future Appointments  Date Time Provider [place]  11/02/2020 11:00 AM Unk Pinto, MD [facility] None  08/01/2021  4:00 PM Liane Comber, NP [facility] None     Plan:   During the course of the visit the patient was educated and counseled about appropriate screening and preventive services including:    Pneumococcal vaccine   Influenza vaccine  Prevnar 13  Td vaccine  Screening electrocardiogram  Colorectal cancer screening  Diabetes screening  Glaucoma screening  Nutrition counseling    Subjective:  Mark [name] is a 65 y.o. male who presents for Medicare Annual Wellness Visit and 3 month follow up for HTN, hyperlipidemia, T2DM, and vitamin D Def.   BMI is Body mass index is 30.1 kg/m., he has been working on diet and exercise, goes to gym 45 min daily.  Wt Readings  from Last 3 Encounters:  07/29/20 225 lb (102.1 kg)  07/20/20 220 lb (99.8 kg)  07/06/20 221 lb (100.2 kg)   His blood pressure has been controlled at home, today their BP is BP: 130/88 He does workout. He denies chest pain, shortness of breath, dizziness.   He is on cholesterol medication (rosuvastatin 5 mg daily, newly on this daily) and denies myalgias. His  cholesterol is not at goal. The cholesterol last visit was:   Lab Results  Component Value Date   CHOL 159 03/22/2020   HDL 40 03/22/2020   LDLCALC 88 03/22/2020   TRIG 214 (H) 03/22/2020   CHOLHDL 4.0 03/22/2020   He has been working on diet and exercise for T2DM with CKD II, on Metformin 2000 mg daily, jardiance 25 mg, on ASA and ARB and denies hyperglycemia, hypoglycemia , increased appetite, nausea, paresthesia of the feet, polydipsia, polyuria and visual disturbances.  Reports fasting typically around 110-130.  Last A1C in the office was:  Lab Results  Component Value Date   HGBA1C 7.6 (H) 03/22/2020   Last GFR Lab Results  Component Value Date   GFRNONAA 72 03/22/2020    Patient is on Vitamin D supplement, taking 5000 IU recently.  Lab Results  Component Value Date   VD25OH 20 (L) 09/18/2019     He has a history of testosterone deficiency and is on testosterone replacement. He is taking 400 cc q30m last 1 week ago 07/22/2020. He states that the testosterone helps with his energy, libido, muscle mass. Denies symptoms of elevated estrogen or dihydrotestosterone.  Lab Results  Component Value Date   TESTOSTERONE 330 03/22/2020   Also prescribed finasteride 5 mg, tamsulosin daily for BPH, reports doing well.   Medication Review:  Current Outpatient Medications (Endocrine & Metabolic):  .  empagliflozin (JARDIANCE) 25 MG TABS tablet, Take 1 tablet (25 mg total) by mouth daily before breakfast. .  metFORMIN (GLUCOPHAGE-XR) 500 MG 24 hr tablet, Take  2 tablets  2 x /day  with Meals for Diabetes .  testosterone cypionate (DEPOTESTOSTERONE CYPIONATE) 200 MG/ML injection, Inject 2 ml into muscle every 2 weeks  Current Facility-Administered Medications (Endocrine & Metabolic):  .  testosterone cypionate (DEPOTESTOSTERONE CYPIONATE) injection 200 mg  Current Outpatient Medications (Cardiovascular):  .  rosuvastatin (CRESTOR) 5 MG tablet, Take one tablet every night for  cholesterol. .  telmisartan (MICARDIS) 20 MG tablet, Take 1 tablet Daily for BP     Current Outpatient Medications (Analgesics):  .  aspirin 81 MG chewable tablet, Chew by mouth daily. (Patient not taking: No sig reported)   Current Outpatient Medications (Hematological):  .Marland Kitchen Cyanocobalamin (VITAMIN B-12 PO), Take by mouth daily.   Current Outpatient Medications (Other):  .  blood glucose meter kit and supplies KIT, Dispense based on patient and insurance preference. Use up to four times daily as directed. (FOR ICD-9 250.00, 250.01). .  Cholecalciferol 1000 units capsule, Take 5,000 Units by mouth daily. .  finasteride (PROSCAR) 5 MG tablet, Take 1 tablet (5 mg total) by mouth daily. .Marland Kitchen glucose blood test strip, Check blood glucose before meals & bedtime time and as needed. .  tamsulosin (FLOMAX) 0.4 MG CAPS capsule, TAKE 1 CAPSULE BY MOUTH AT BEDTIME FOR PROSTATE   Allergies: Allergies  Allergen Reactions  . Codeine     REACTION: Anxiety    Current Problems (verified) has Hyperlipidemia associated with type 2 diabetes mellitus ([place]; Hypertension; Type 2 diabetes mellitus ([place]; GERD (gastroesophageal reflux disease); Vitamin D  deficiency; Testosterone deficiency; Obesity (BMI 30.0-34.9); Medication management; Benign prostatic hyperplasia with urinary frequency; CKD stage 2 due to type 2 diabetes mellitus ([place]); B12 deficiency; and History of colon polyps on their problem list.  Screening Tests Immunization History  Administered Date(s) Administered  . Td 03/27/2005  . Tdap 08/09/2015    Preventative care: Last colonoscopy: 11/04/2009 Dr. Fuller Plan, polyps  Prior vaccinations: TD or Tdap: 2017  Influenza: declines   Pneumococcal: declines Shingles/Zostavax: declines  Covid 19: defer   Names of Other Physician/Practitioners you currently use: 1. [facility] here for primary care 2. [facility], gets checked annually at [employer]  physical, last 2021, request report for diabetes eye  3. Dr. Jenne Campus, dentist, last visit 2021, looking for new dentist  Patient Care Team: Unk Pinto, MD as PCP - General (Internal Medicine)  Surgical: He  has a past surgical history that includes Vasectomy (01/2004) and Urethral dilation (2007). Family His family history includes COPD in his father; Heart disease in his father; Hyperlipidemia in his mother; Hypertension in his father and mother. Social history  He reports that he has never smoked. He has never used smokeless tobacco. He reports that he does not drink alcohol and does not use drugs.  MEDICARE WELLNESS OBJECTIVES: Physical activity: Current Exercise Habits: Home exercise routine, Type of exercise: strength training/weights, Time (Minutes): 45, Frequency (Times/Week): 7, Weekly Exercise (Minutes/Week): 315, Intensity: Mild, Exercise limited by: None identified Cardiac risk factors: Cardiac Risk Factors include: advanced age (>33mn, >>71women);dyslipidemia;hypertension;obesity (BMI >30kg/m2);diabetes mellitus;male gender;family history of premature cardiovascular disease Depression/mood screen:   [facility] 07/29/2020  Decreased Interest 0  Down, Depressed, Hopeless 0  PHQ - 2 Score 0  Altered sleeping -  Tired, decreased energy -  Change in appetite -  Feeling bad or failure about yourself  -  Trouble concentrating -  Moving slowly or fidgety/restless -  Suicidal thoughts -  PHQ-9 Score -  Difficult doing work/chores -    ADLs:  In your present state of health, do you have any difficulty performing the following activities: 07/29/2020  Hearing? N  Vision? N  Difficulty concentrating or making decisions? N  Walking or climbing stairs? N  Dressing or bathing? N  Doing errands, shopping? N  Some recent data might be hidden     Cognitive Testing  Alert? Yes  Normal Appearance?Yes  Oriented to person? Yes  Place? Yes   Time? Yes  Recall of  three objects?  Yes  Can perform simple calculations? Yes  Displays appropriate judgment?Yes  Can read the correct time from a watch face?Yes  EOL planning: Does Patient Have a Medical Advance Directive?: No Would patient like information on creating a medical advance directive?: No - Patient declined   Objective:   Today's Vitals   07/29/20 1612  BP: 130/88  Pulse: 92  Temp: (!) 97.5 F (36.4 C)  SpO2: 94%  Weight: 225 lb (102.1 kg)  Height: 6' 0.5" (1.842 m)   Body mass index is 30.1 kg/m.  General appearance: alert, no distress, WD/WN, male HEENT: normocephalic, sclerae anicteric, TMs pearly, nares patent, no discharge or erythema, pharynx normal Oral cavity: MMM, no lesions Neck: supple, no lymphadenopathy, no thyromegaly, no masses Heart: RRR, normal S1, S2, no murmurs Lungs: CTA bilaterally, no wheezes, rhonchi, or rales Abdomen: +bs, soft, non tender, non distended, no masses, no hepatomegaly, no splenomegaly Musculoskeletal: nontender, no swelling, no obvious deformity Extremities: no edema, no cyanosis, no clubbing  Pulses: 2+ symmetric, upper and lower extremities, normal cap refill Neurological: alert, oriented x 3, CN2-12 intact, strength normal upper extremities and lower extremities, sensation normal throughout, DTRs 2+ throughout, no cerebellar signs, gait normal Psychiatric: normal affect, behavior normal, pleasant   EKG: NSR AAA [place]: <3 cm, normal    Medicare Attestation I have personally reviewed: The patient's medical and social history Their use of alcohol, tobacco or illicit drugs Their current medications and supplements The patient's functional ability including ADLs,fall risks, home safety risks, cognitive, and hearing and visual impairment Diet and physical activities Evidence for depression or mood disorders  The patient's weight, height, BMI, and visual acuity have been recorded in the chart.  I have made referrals, counseling, and provided  education to the patient based on review of the above and I have provided the patient with a written personalized care plan for preventive services.     Mark Ribas, NP   07/29/2020

## 2020-07-29 ENCOUNTER — Ambulatory Visit (INDEPENDENT_AMBULATORY_CARE_PROVIDER_SITE_OTHER): Payer: Medicare PPO | Admitting: Adult Health

## 2020-07-29 ENCOUNTER — Other Ambulatory Visit: Payer: Self-pay

## 2020-07-29 ENCOUNTER — Encounter: Payer: Self-pay | Admitting: Adult Health

## 2020-07-29 VITALS — BP 130/88 | HR 92 | Temp 97.5°F | Ht 72.5 in | Wt 225.0 lb

## 2020-07-29 DIAGNOSIS — E559 Vitamin D deficiency, unspecified: Secondary | ICD-10-CM

## 2020-07-29 DIAGNOSIS — E785 Hyperlipidemia, unspecified: Secondary | ICD-10-CM | POA: Diagnosis not present

## 2020-07-29 DIAGNOSIS — E1122 Type 2 diabetes mellitus with diabetic chronic kidney disease: Secondary | ICD-10-CM

## 2020-07-29 DIAGNOSIS — E349 Endocrine disorder, unspecified: Secondary | ICD-10-CM | POA: Diagnosis not present

## 2020-07-29 DIAGNOSIS — Z0001 Encounter for general adult medical examination with abnormal findings: Secondary | ICD-10-CM

## 2020-07-29 DIAGNOSIS — R6889 Other general symptoms and signs: Secondary | ICD-10-CM

## 2020-07-29 DIAGNOSIS — K219 Gastro-esophageal reflux disease without esophagitis: Secondary | ICD-10-CM | POA: Diagnosis not present

## 2020-07-29 DIAGNOSIS — E1169 Type 2 diabetes mellitus with other specified complication: Secondary | ICD-10-CM | POA: Diagnosis not present

## 2020-07-29 DIAGNOSIS — Z86010 Personal history of colonic polyps: Secondary | ICD-10-CM

## 2020-07-29 DIAGNOSIS — Z79899 Other long term (current) drug therapy: Secondary | ICD-10-CM

## 2020-07-29 DIAGNOSIS — Z1211 Encounter for screening for malignant neoplasm of colon: Secondary | ICD-10-CM

## 2020-07-29 DIAGNOSIS — I10 Essential (primary) hypertension: Secondary | ICD-10-CM

## 2020-07-29 DIAGNOSIS — N401 Enlarged prostate with lower urinary tract symptoms: Secondary | ICD-10-CM

## 2020-07-29 DIAGNOSIS — N182 Chronic kidney disease, stage 2 (mild): Secondary | ICD-10-CM

## 2020-07-29 DIAGNOSIS — R350 Frequency of micturition: Secondary | ICD-10-CM

## 2020-07-29 DIAGNOSIS — E538 Deficiency of other specified B group vitamins: Secondary | ICD-10-CM | POA: Diagnosis not present

## 2020-07-29 DIAGNOSIS — E669 Obesity, unspecified: Secondary | ICD-10-CM

## 2020-07-29 DIAGNOSIS — Z136 Encounter for screening for cardiovascular disorders: Secondary | ICD-10-CM

## 2020-07-29 DIAGNOSIS — Z0000 Encounter for general adult medical examination without abnormal findings: Secondary | ICD-10-CM

## 2020-07-29 NOTE — Patient Instructions (Signed)
Mr. Mark [name] , Thank you for taking time to come for your Medicare Wellness Visit. I appreciate your ongoing commitment to your health goals. Please review the following plan we discussed and let me know if I can assist you in the future.   These are the goals we discussed: Goals    . Blood Pressure < 130/80    . DIET - INCREASE WATER INTAKE     65-80+ fluid ounces daily to help maintain kidney functions    . Fasting Blood Glucose <130    . HEMOGLOBIN A1C < 7    . Weight (lb) < 210 lb (95.3 kg)       This is a list of the screening recommended for you and due dates:  Health Maintenance  Topic Date Due  . Eye exam for diabetics  Never done  . Colon Cancer Screening  11/05/2019  . COVID-19 Vaccine (1) 08/14/2020*  . Pneumonia vaccines (1 of 2 - PCV13) 07/29/2021*  . Complete foot exam   09/17/2020  . Hemoglobin A1C  09/20/2020  . Flu Shot  10/25/2020  . Tetanus Vaccine  08/08/2025  .  Hepatitis C: One time screening is recommended by Center for Disease Control  (CDC) for  adults born from [birth year] through 1966.   Completed  . HIV Screening  Completed  . HPV Vaccine  Aged Out  *Topic was postponed. The date shown is not the original due date.      High-Fiber Eating Plan Fiber, also called dietary fiber, is a type of carbohydrate. It is found foods such as fruits, vegetables, whole grains, and beans. A high-fiber diet can have many health benefits. Your health care provider may recommend a high-fiber diet to help:  Prevent constipation. Fiber can make your bowel movements more regular.  Lower your cholesterol.  Relieve the following conditions: ? Inflammation of veins in the anus (hemorrhoids). ? Inflammation of specific areas of the digestive tract (uncomplicated diverticulosis). ? A problem of the large intestine, also called the colon, that sometimes causes pain and diarrhea (irritable bowel syndrome, or IBS).  Prevent overeating as part of a weight-loss plan.  Prevent heart  disease, type 2 diabetes, and certain cancers. What are tips for following this plan? Reading food labels  Check the nutrition facts label on food products for the amount of dietary fiber. Choose foods that have 5 grams of fiber or more per serving.  The goals for recommended daily fiber intake include: ? Men (age 67 or younger): 34-38 g. ? Men (over age 77): 28-34 g. ? Women (age 26 or younger): 25-28 g. ? Women (over age 53): 22-25 g. Your daily fiber goal is _____________ g.   Shopping  Choose whole fruits and vegetables instead of processed forms, such as apple juice or applesauce.  Choose a wide variety of high-fiber foods such as avocados, lentils, oats, and kidney beans.  Read the nutrition facts label of the foods you choose. Be aware of foods with added fiber. These foods often have high sugar and sodium amounts per serving. Cooking  Use whole-grain flour for baking and cooking.  Cook with brown rice instead of white rice. Meal planning  Start the day with a breakfast that is high in fiber, such as a cereal that contains 5 g of fiber or more per serving.  Eat breads and cereals that are made with whole-grain flour instead of refined flour or white flour.  Eat brown rice, bulgur wheat, or millet instead of white rice.  Use beans in place of meat in soups, salads, and pasta dishes.  Be sure that half of the grains you eat each day are whole grains. General information  You can get the recommended daily intake of dietary fiber by: ? Eating a variety of fruits, vegetables, grains, nuts, and beans. ? Taking a fiber supplement if you are not able to take in enough fiber in your diet. It is better to get fiber through food than from a supplement.  Gradually increase how much fiber you consume. If you increase your intake of dietary fiber too quickly, you may have bloating, cramping, or gas.  Drink plenty of water to help you digest fiber.  Choose high-fiber snacks, such  as berries, raw vegetables, nuts, and popcorn. What foods should I eat? Fruits Berries. Pears. Apples. Oranges. Avocado. Prunes and raisins. Dried figs. Vegetables Sweet potatoes. Spinach. Kale. Artichokes. Cabbage. Broccoli. Cauliflower. Green peas. Carrots. Squash. Grains Whole-grain breads. Multigrain cereal. Oats and oatmeal. Brown rice. Barley. Bulgur wheat. Millet. Quinoa. Bran muffins. Popcorn. Rye wafer crackers. Meats and other proteins Navy beans, kidney beans, and pinto beans. Soybeans. Split peas. Lentils. Nuts and seeds. Dairy Fiber-fortified yogurt. Beverages Fiber-fortified soy milk. Fiber-fortified orange juice. Other foods Fiber bars. The items listed above may not be a complete list of recommended foods and beverages. Contact a dietitian for more information. What foods should I avoid? Fruits Fruit juice. Cooked, strained fruit. Vegetables Fried potatoes. Canned vegetables. Well-cooked vegetables. Grains White bread. Pasta made with refined flour. White rice. Meats and other proteins Fatty cuts of meat. Fried chicken or fried fish. Dairy Milk. Yogurt. Cream cheese. Sour cream. Fats and oils Butters. Beverages Soft drinks. Other foods Cakes and pastries. The items listed above may not be a complete list of foods and beverages to avoid. Talk with your dietitian about what choices are best for you. Summary  Fiber is a type of carbohydrate. It is found in foods such as fruits, vegetables, whole grains, and beans.  A high-fiber diet has many benefits. It can help to prevent constipation, lower blood cholesterol, aid weight loss, and reduce your risk of heart disease, diabetes, and certain cancers.  Increase your intake of fiber gradually. Increasing fiber too quickly may cause cramping, bloating, and gas. Drink plenty of water while you increase the amount of fiber you consume.  The best sources of fiber include whole fruits and vegetables, whole grains, nuts,  seeds, and beans. This information is not intended to replace advice given to you by your health care provider. Make sure you discuss any questions you have with your health care provider. Document Revised: 07/17/2019 Document Reviewed: 07/17/2019 Elsevier Patient Education  2021 [employer].

## 2020-07-29 NOTE — Progress Notes (Signed)
Aorta scan complete and results were within the normal limits, <3cm. Provider aware and signed off.  

## 2020-07-30 ENCOUNTER — Other Ambulatory Visit: Payer: Self-pay | Admitting: Adult Health

## 2020-07-30 DIAGNOSIS — E1169 Type 2 diabetes mellitus with other specified complication: Secondary | ICD-10-CM

## 2020-07-30 DIAGNOSIS — E785 Hyperlipidemia, unspecified: Secondary | ICD-10-CM

## 2020-07-30 LAB — COMPLETE METABOLIC PANEL WITH GFR
AG Ratio: 1.8 (calc) (ref 1.0–2.5)
ALT: 21 U/L (ref 9–46)
AST: 17 U/L (ref 10–35)
Albumin: 4.4 g/dL (ref 3.6–5.1)
Alkaline phosphatase (APISO): 100 U/L (ref 35–144)
BUN: 10 mg/dL (ref 7–25)
CO2: 25 mmol/L (ref 20–32)
Calcium: 9.4 mg/dL (ref 8.6–10.3)
Chloride: 104 mmol/L (ref 98–110)
Creat: 1.17 mg/dL (ref 0.70–1.25)
GFR, Est African American: 75 mL/min/{1.73_m2} (ref >=60)
GFR, Est Non African American: 65 mL/min/{1.73_m2} (ref >=60)
Globulin: 2.5 g/dL (calc) (ref 1.9–3.7)
Glucose, Bld: 177 mg/dL — ABNORMAL HIGH (ref 65–99)
Potassium: 4.2 mmol/L (ref 3.5–5.3)
Sodium: 140 mmol/L (ref 135–146)
Total Bilirubin: 0.4 mg/dL (ref 0.2–1.2)
Total Protein: 6.9 g/dL (ref 6.1–8.1)

## 2020-07-30 LAB — CBC WITH DIFFERENTIAL/PLATELET
Absolute Monocytes: 720 cells/uL (ref 200–950)
Basophils Absolute: 64 cells/uL (ref 0–200)
Basophils Relative: 0.8 %
Eosinophils Absolute: 168 cells/uL (ref 15–500)
Eosinophils Relative: 2.1 %
HCT: 42.8 % (ref 38.5–50.0)
Hemoglobin: 14.1 g/dL (ref 13.2–17.1)
Lymphs Abs: 2256 cells/uL (ref 850–3900)
MCH: 29.4 pg (ref 27.0–33.0)
MCHC: 32.9 g/dL (ref 32.0–36.0)
MCV: 89.4 fL (ref 80.0–100.0)
MPV: 11.1 fL (ref 7.5–12.5)
Monocytes Relative: 9 %
Neutro Abs: 4792 cells/uL (ref 1500–7800)
Neutrophils Relative %: 59.9 %
Platelets: 244 10*3/uL (ref 140–400)
RBC: 4.79 10*6/uL (ref 4.20–5.80)
RDW: 12.4 % (ref 11.0–15.0)
Total Lymphocyte: 28.2 %
WBC: 8 10*3/uL (ref 3.8–10.8)

## 2020-07-30 LAB — LIPID PANEL
Cholesterol: 173 mg/dL (ref <200)
HDL: 35 mg/dL — ABNORMAL LOW (ref >=40)
LDL Cholesterol (Calc): 95 mg/dL (calc)
Non-HDL Cholesterol (Calc): 138 mg/dL (calc) — ABNORMAL HIGH (ref <130)
Total CHOL/HDL Ratio: 4.9 (calc) (ref <5.0)
Triglycerides: 322 mg/dL — ABNORMAL HIGH (ref <150)

## 2020-07-30 LAB — VITAMIN D 25 HYDROXY (VIT D DEFICIENCY, FRACTURES): Vit D, 25-Hydroxy: 20 ng/mL — ABNORMAL LOW (ref 30–100)

## 2020-07-30 LAB — HEMOGLOBIN A1C
Hgb A1c MFr Bld: 8.6 % of total Hgb — ABNORMAL HIGH (ref <5.7)
Mean Plasma Glucose: 200 mg/dL
eAG (mmol/L): 11.1 mmol/L

## 2020-07-30 LAB — TSH: TSH: 1.45 mIU/L (ref 0.40–4.50)

## 2020-07-30 LAB — MAGNESIUM: Magnesium: 2.2 mg/dL (ref 1.5–2.5)

## 2020-07-30 MED ORDER — ROSUVASTATIN CALCIUM 10 MG PO TABS: ORAL_TABLET | ORAL | 1 refills | Status: DC

## 2020-08-03 ENCOUNTER — Other Ambulatory Visit: Payer: Self-pay | Admitting: Adult Health

## 2020-08-03 DIAGNOSIS — E1165 Type 2 diabetes mellitus with hyperglycemia: Secondary | ICD-10-CM

## 2020-08-03 DIAGNOSIS — N182 Chronic kidney disease, stage 2 (mild): Secondary | ICD-10-CM

## 2020-08-03 DIAGNOSIS — E1122 Type 2 diabetes mellitus with diabetic chronic kidney disease: Secondary | ICD-10-CM

## 2020-08-03 MED ORDER — OZEMPIC (0.25 OR 0.5 MG/DOSE) 2 MG/1.5ML ~~LOC~~ SOPN: PEN_INJECTOR | SUBCUTANEOUS | 0 refills | Status: DC

## 2020-08-26 ENCOUNTER — Other Ambulatory Visit: Payer: Self-pay

## 2020-08-26 ENCOUNTER — Ambulatory Visit (INDEPENDENT_AMBULATORY_CARE_PROVIDER_SITE_OTHER): Payer: Medicare PPO

## 2020-08-26 VITALS — BP 140/90 | HR 79 | Temp 97.7°F | Resp 17 | Ht 72.0 in | Wt 220.0 lb

## 2020-08-26 DIAGNOSIS — E349 Endocrine disorder, unspecified: Secondary | ICD-10-CM

## 2020-08-26 DIAGNOSIS — Z79899 Other long term (current) drug therapy: Secondary | ICD-10-CM

## 2020-08-27 ENCOUNTER — Other Ambulatory Visit: Payer: Self-pay | Admitting: Adult Health

## 2020-08-27 MED ORDER — DOXYCYCLINE HYCLATE 100 MG PO CAPS: ORAL_CAPSULE | ORAL | 0 refills | Status: DC

## 2020-08-31 ENCOUNTER — Encounter: Payer: BC Managed Care – PPO | Admitting: Adult Health

## 2020-09-14 ENCOUNTER — Other Ambulatory Visit: Payer: Self-pay

## 2020-09-14 ENCOUNTER — Ambulatory Visit (INDEPENDENT_AMBULATORY_CARE_PROVIDER_SITE_OTHER): Payer: Medicare PPO

## 2020-09-14 VITALS — BP 134/82 | HR 71 | Temp 97.5°F | Ht 72.5 in | Wt 226.0 lb

## 2020-09-14 DIAGNOSIS — R350 Frequency of micturition: Secondary | ICD-10-CM

## 2020-09-14 DIAGNOSIS — N401 Enlarged prostate with lower urinary tract symptoms: Secondary | ICD-10-CM

## 2020-09-14 DIAGNOSIS — E349 Endocrine disorder, unspecified: Secondary | ICD-10-CM

## 2020-09-14 MED ORDER — TESTOSTERONE CYPIONATE 200 MG/ML IM SOLN
200.0000 mg | Freq: Once | INTRAMUSCULAR | Status: AC
  Administered 2020-09-14: 200 mg via INTRAMUSCULAR

## 2020-09-14 NOTE — Progress Notes (Signed)
Patient presents to the office for a Testosterone Cypionate 200 mg/ml 2 ml IM injection in upper RIGHT outer quadrant. Patient tolerated well. 

## 2020-09-17 ENCOUNTER — Encounter: Payer: BC Managed Care – PPO | Admitting: Internal Medicine

## 2020-10-11 ENCOUNTER — Other Ambulatory Visit: Payer: Self-pay | Admitting: Adult Health

## 2020-10-11 DIAGNOSIS — N401 Enlarged prostate with lower urinary tract symptoms: Secondary | ICD-10-CM

## 2020-10-12 ENCOUNTER — Other Ambulatory Visit: Payer: Self-pay

## 2020-10-12 ENCOUNTER — Encounter: Payer: Self-pay | Admitting: Adult Health

## 2020-10-12 ENCOUNTER — Ambulatory Visit: Payer: Medicare PPO | Admitting: Adult Health

## 2020-10-12 VITALS — HR 72 | Temp 97.5°F | Wt 228.0 lb

## 2020-10-12 DIAGNOSIS — M25551 Pain in right hip: Secondary | ICD-10-CM

## 2020-10-12 DIAGNOSIS — E349 Endocrine disorder, unspecified: Secondary | ICD-10-CM

## 2020-10-12 DIAGNOSIS — N401 Enlarged prostate with lower urinary tract symptoms: Secondary | ICD-10-CM

## 2020-10-12 DIAGNOSIS — R350 Frequency of micturition: Secondary | ICD-10-CM

## 2020-10-12 MED ORDER — TESTOSTERONE CYPIONATE 200 MG/ML IM SOLN
200.0000 mg | Freq: Once | INTRAMUSCULAR | Status: AC
  Administered 2020-10-12: 200 mg via INTRAMUSCULAR

## 2020-10-12 NOTE — Progress Notes (Signed)
Patient presents to the office for a Testosterone Cypionate 200 mg/ml 2 ml IM injection in upper LEFT outer quadrant. Patient tolerated well. 

## 2020-10-12 NOTE — Progress Notes (Signed)
Assessment and Plan:  Shaya was seen today for injections and hip pain.  Diagnoses and all orders for this visit:  Lateral pain of right hip Unremarkable exam today, from HPI ? Mild muscular spasm; low suspicion imaging would show anything significant from exam and hx. He is in agreement to defer after discussion. Try cushion, NSAID prior to long trips, can try heat or ice prior. Continue regular breaks and stretching as needed. Follow up if getting worse.   Testosterone deficiency Continued perceived benefits with supplementation, no SE. Check level PRN, continue zinc, no symptoms of elevated estrogen or dihydrotestosterone.  -     testosterone cypionate (DEPOTESTOSTERONE CYPIONATE) injection 200 mg  Further disposition pending results of labs. Discussed med's effects and SE's.   Over 15 minutes of exam, counseling, chart review, and critical decision making was performed.   Future Appointments  Date Time Provider [place]  11/02/2020 11:00 AM Unk Pinto, MD [facility] None  08/01/2021  4:00 PM Liane Comber, NP [facility] None    ------------------------------------------------------------------------------------------------------------------   HPI Pulse 72   Temp (!) 97.5 F (36.4 C)   Wt 228 lb (103.4 kg)   SpO2 97%   BMI 30.50 kg/m  64 y.o.male presents for evaluation of intermittent R hip pain, also due for testosterone injection.   He reports starting 1 month ago has noted a few episodes, only while in a long car ride. He reports after 40 miles or so will start having burning/aching in [place] R hip, no radiation. He will get out and walk and sx will quickly resolve. He notes sx in different cars, doesn't sit on wallet, no lumbar pain, injury He notes aching/burning in lateral hip, resolves quickly   Lab Results  Component Value Date   HGBA1C 8.6 (H) 07/29/2020   He has ongoing testosterone def, getting 400 mg q2 weeks, due today. Patient  continues to perceive benefit from therapy.  Lab Results  Component Value Date   TESTOSTERONE 330 03/22/2020     Past Medical History:  Diagnosis Date   GERD (gastroesophageal reflux disease)    Hyperlipidemia    Hypertension    Other testicular hypofunction    Prediabetes    Vitamin D deficiency      Allergies  Allergen Reactions   Codeine     REACTION: Anxiety    Current Outpatient Medications on File Prior to Visit  Medication Sig   aspirin 81 MG chewable tablet Chew by mouth daily.   blood glucose meter kit and supplies KIT Dispense based on patient and insurance preference. Use up to four times daily as directed. (FOR ICD-9 250.00, 250.01).   Cholecalciferol 1000 units capsule Take 10,000 Units by mouth daily.   Cyanocobalamin (VITAMIN B-12 PO) Take by mouth daily.   doxycycline (VIBRAMYCIN) 100 MG capsule Take 2 capsule once with food for Lyme's prophylaxis   empagliflozin (JARDIANCE) 25 MG TABS tablet Take 1 tablet (25 mg total) by mouth daily before breakfast.   finasteride (PROSCAR) 5 MG tablet Take 1 tablet (5 mg total) by mouth daily.   glucose blood test strip Check blood glucose before meals & bedtime time and as needed.   metFORMIN (GLUCOPHAGE-XR) 500 MG 24 hr tablet Take  2 tablets  2 x /day  with Meals for Diabetes   rosuvastatin (CRESTOR) 10 MG tablet Take one tablet every night for cholesterol.   Semaglutide,0.25 or 0.5MG/DOS, (OZEMPIC, 0.25 OR 0.5 MG/DOSE,) 2 MG/1.5ML SOPN Start by injecting 0.25 mg into skin of abdomen once  weekly for 4 weeks, then if tolerating well increase to taking 0.5 mg weekly. For diabetes and weight.   tamsulosin (FLOMAX) 0.4 MG CAPS capsule TAKE 1 CAPSULE BY MOUTH AT BEDTIME FOR PROSTATE   telmisartan (MICARDIS) 20 MG tablet Take 1 tablet Daily for BP   testosterone cypionate (DEPOTESTOSTERONE CYPIONATE) 200 MG/ML injection Inject 2 ml into muscle every 2 weeks   Current Facility-Administered Medications on File Prior to Visit   Medication   testosterone cypionate (DEPOTESTOSTERONE CYPIONATE) injection 200 mg    ROS: all negative except above.   Physical Exam:  Pulse 72   Temp (!) 97.5 F (36.4 C)   Wt 228 lb (103.4 kg)   SpO2 97%   BMI 30.50 kg/m   General Appearance: Well nourished, in no apparent distress. Eyes: conjunctiva no swelling or erythema ENT/Mouth: Mask in place; Hearing normal.  Neck: Supple Respiratory: Respiratory effort normal Cardio: RRR with no MRGs. Brisk peripheral pulses without edema.  Abdomen: Soft, + BS.  Non tender, no guarding, rebound, hernias, masses. Lymphatics: Non tender without lymphadenopathy.  Musculoskeletal: Full ROM lumbar, bil hips without clicking, popping, grinding, 5/5 strength, normal gait. No tenderness through lumbar/SI joint, gluteal or R hamstring/quad, trochanter.  Skin: Warm, dry without rashes, lesions, ecchymosis.  Neuro: Normal muscle tone, sensation intact throughout bil lower extremities.  Psych: Awake and oriented X 3, normal affect, Insight and Judgment appropriate.     Izora Ribas, NP 3:48 PM [facility]

## 2020-11-02 ENCOUNTER — Ambulatory Visit (INDEPENDENT_AMBULATORY_CARE_PROVIDER_SITE_OTHER): Payer: Medicare PPO | Admitting: Internal Medicine

## 2020-11-02 ENCOUNTER — Other Ambulatory Visit: Payer: Self-pay

## 2020-11-02 ENCOUNTER — Encounter: Payer: Self-pay | Admitting: Internal Medicine

## 2020-11-02 VITALS — BP 138/84 | HR 78 | Temp 97.7°F | Resp 16 | Ht 72.6 in | Wt 227.0 lb

## 2020-11-02 DIAGNOSIS — N401 Enlarged prostate with lower urinary tract symptoms: Secondary | ICD-10-CM | POA: Diagnosis not present

## 2020-11-02 DIAGNOSIS — E538 Deficiency of other specified B group vitamins: Secondary | ICD-10-CM

## 2020-11-02 DIAGNOSIS — Z8249 Family history of ischemic heart disease and other diseases of the circulatory system: Secondary | ICD-10-CM | POA: Diagnosis not present

## 2020-11-02 DIAGNOSIS — E559 Vitamin D deficiency, unspecified: Secondary | ICD-10-CM | POA: Diagnosis not present

## 2020-11-02 DIAGNOSIS — E1169 Type 2 diabetes mellitus with other specified complication: Secondary | ICD-10-CM

## 2020-11-02 DIAGNOSIS — E291 Testicular hypofunction: Secondary | ICD-10-CM

## 2020-11-02 DIAGNOSIS — Z125 Encounter for screening for malignant neoplasm of prostate: Secondary | ICD-10-CM | POA: Diagnosis not present

## 2020-11-02 DIAGNOSIS — E785 Hyperlipidemia, unspecified: Secondary | ICD-10-CM | POA: Diagnosis not present

## 2020-11-02 DIAGNOSIS — Z79899 Other long term (current) drug therapy: Secondary | ICD-10-CM

## 2020-11-02 DIAGNOSIS — Z136 Encounter for screening for cardiovascular disorders: Secondary | ICD-10-CM

## 2020-11-02 DIAGNOSIS — Z0000 Encounter for general adult medical examination without abnormal findings: Secondary | ICD-10-CM

## 2020-11-02 DIAGNOSIS — E1122 Type 2 diabetes mellitus with diabetic chronic kidney disease: Secondary | ICD-10-CM

## 2020-11-02 DIAGNOSIS — N138 Other obstructive and reflux uropathy: Secondary | ICD-10-CM

## 2020-11-02 DIAGNOSIS — I10 Essential (primary) hypertension: Secondary | ICD-10-CM

## 2020-11-02 DIAGNOSIS — E349 Endocrine disorder, unspecified: Secondary | ICD-10-CM

## 2020-11-02 DIAGNOSIS — Z1211 Encounter for screening for malignant neoplasm of colon: Secondary | ICD-10-CM

## 2020-11-02 DIAGNOSIS — N182 Chronic kidney disease, stage 2 (mild): Secondary | ICD-10-CM

## 2020-11-02 LAB — VITAMIN D 25 HYDROXY (VIT D DEFICIENCY, FRACTURES): Vit D, 25-Hydroxy: 23 ng/mL — ABNORMAL LOW (ref 30–100)

## 2020-11-02 MED ORDER — TESTOSTERONE CYPIONATE 200 MG/ML IM SOLN
200.0000 mg | Freq: Once | INTRAMUSCULAR | Status: AC
  Administered 2020-11-02: 200 mg via INTRAMUSCULAR

## 2020-11-02 NOTE — Patient Instructions (Signed)

## 2020-11-02 NOTE — Progress Notes (Signed)
Annual  Screening/Preventative Visit  & Comprehensive Evaluation & Examination  Future Appointments  Date Time Provider Department Center  08/01/2021  4:00 PM Judd Gaudier, NP [facility] None            This very nice 65 y.o.  MWM presents for a Screening /Preventative Visit & comprehensive evaluation and management of multiple medical co-morbidities.  Patient has been followed for HTN, HLD, T2_NIDDM  and Vitamin D Deficiency.       HTN predates since 2007. Patient's BP has been controlled at home.  Negative Cardiolyte in 2007. Today's BP is at goal - 138/84. Patient denies any cardiac symptoms as chest pain, palpitations, shortness of breath, dizziness or ankle swelling.       Patient's hyperlipidemia is controlled with diet and medications. Patient denies myalgias or other medication SE's. Current lipids were at goal except elevated Trig's:  Lab Results  Component Value Date   CHOL 164 11/02/2020   HDL 39 (L) 11/02/2020   LDLCALC 89 11/02/2020   TRIG 301 (H) 11/02/2020   CHOLHDL 4.2 11/02/2020         Patient has hx/o T2_NIDDM  (2015)  w/CKD2 (GFR 86) and patient denies reactive hypoglycemic symptoms, visual blurring, diabetic polys or paresthesias. Current A1c was not at goal:   Lab Results  Component Value Date   HGBA1C 8.6 (H) 11/02/2020        Patient has hx/o Low Testosterone ("184") in 2012.  Finally, patient has history of Vitamin D Deficiency ("24" /2013 and "17" / 2016) and current vitamin D is still very low :   Lab Results  Component Value Date   VD25OH 23 (L) 11/02/2020    Current Outpatient Medications on File Prior to Visit  Medication Sig   aspirin 81 MG chewable tablet Chew by mouth daily.   Cholecalciferol 1000 units capsule Take 10,000 Units by mouth daily.   Cyanocobalamin (VITAMIN B-12 PO) Take by mouth daily.   finasteride (PROSCAR) 5 MG tablet Take 1 tablet (5 mg total) by mouth daily.   Metformin -XR) 500 MG 24 hr tablet Take  2 tablets  2  x /day  with Meals for Diabetes   rosuvastatin (CRESTOR) 10 MG tablet Take one tablet every night for cholesterol.   tamsulosin (FLOMAX) 0.4 MG CAPS capsule TAKE 1 CAPSULE AT BEDTIME    telmisartan (MICARDIS) 20 MG tablet Take 1 tablet Daily for BP   testosterone cypio 200 MG/ML injection Inject 2 ml into muscle every 2 weeks     Allergies  Allergen Reactions   Codeine     REACTION: Anxiety     Past Medical History:  Diagnosis Date   GERD (gastroesophageal reflux disease)    Hyperlipidemia    Hypertension    Other testicular hypofunction    Prediabetes    Vitamin D deficiency      Health Maintenance  Topic Date Due   COVID-19 Vaccine (1) Never done   OPHTHALMOLOGY EXAM  Never done   Zoster Vaccines- Shingrix (1 of 2) Never done   COLONOSCOPY (Pts 45-24yrs Insurance coverage will need to be confirmed)  11/05/2019   INFLUENZA VACCINE  10/25/2020   PNA vac Low Risk Adult (1 of 2 - PCV13) 07/29/2021 (Originally 03/14/2020)   HEMOGLOBIN A1C  05/05/2021   FOOT EXAM  11/02/2021   TETANUS/TDAP  08/08/2025   Hepatitis C Screening  Completed   HIV Screening  Completed   HPV VACCINES  Aged [employer]  History  Administered Date(s) Administered   Td 03/27/2005   Tdap 08/09/2015    Last Colon - 11/04/2009 - Dr Russella Dar - Recc 10 yr f/u Colon  02/13/2020 - sent letter by Dr Russella Dar  reminding to schedule 10 yr f/u Colon     Past Surgical History:  Procedure Laterality Date   URETHRAL DILATION  2007   VASECTOMY  01/2004     Family History  Problem Relation Age of Onset   Hypertension Mother    Hyperlipidemia Mother    COPD Father        smoker   Heart disease Father    Hypertension Father     Social History   Socioeconomic History   Marital status: Married    Spouse name: Not on file   Number of children: 3   Years of education: Not on file   Highest education level: Not on file  Occupational History   Occupation: Charity fundraiser:  [employer]    Comment: limo  Tobacco Use   Smoking status: Never   Smokeless tobacco: Never  Vaping Use   Vaping Use: Never used  Substance and Sexual Activity   Alcohol use: No   Drug use: No   Sexual activity: Yes    Partners: Female    Birth control/protection: Post-menopausal      ROS Constitutional: Denies fever, chills, weight loss/gain, headaches, insomnia,  night sweats or change in appetite. Does c/o fatigue. Eyes: Denies redness, blurred vision, diplopia, discharge, itchy or watery eyes.  ENT: Denies discharge, congestion, post nasal drip, epistaxis, sore throat, earache, hearing loss, dental pain, Tinnitus, Vertigo, Sinus pain or snoring.  Cardio: Denies chest pain, palpitations, irregular heartbeat, syncope, dyspnea, diaphoresis, orthopnea, PND, claudication or edema Respiratory: denies cough, dyspnea, DOE, pleurisy, hoarseness, laryngitis or wheezing.  Gastrointestinal: Denies dysphagia, heartburn, reflux, water brash, pain, cramps, nausea, vomiting, bloating, diarrhea, constipation, hematemesis, melena, hematochezia, jaundice or hemorrhoids Genitourinary: Denies dysuria, frequency, urgency, nocturia, hesitancy, discharge, hematuria or flank pain Musculoskeletal: Denies arthralgia, myalgia, stiffness, Jt. Swelling, pain, limp or strain/sprain. Denies Falls. Skin: Denies puritis, rash, hives, warts, acne, eczema or change in skin lesion Neuro: No weakness, tremor, incoordination, spasms, paresthesia or pain Psychiatric: Denies confusion, memory loss or sensory loss. Denies Depression. Endocrine: Denies change in weight, skin, hair change, nocturia, and paresthesia, diabetic polys, visual blurring or hyper / hypo glycemic episodes.  Heme/Lymph: No excessive bleeding, bruising or enlarged lymph nodes.   Physical Exam  BP 138/84   Pulse 78   Temp 97.7 F (36.5 C)   Resp 16   Ht 6' 0.6" (1.844 m)   Wt 227 lb (103 kg)   SpO2 97%   BMI 30.28 kg/m   General Appearance:  Well nourished and well groomed and in no apparent distress.  Eyes: PERRLA, EOMs, conjunctiva no swelling or erythema, normal fundi and vessels. Sinuses: No frontal/maxillary tenderness ENT/Mouth: EACs patent / TMs  nl. Nares clear without erythema, swelling, mucoid exudates. Oral hygiene is good. No erythema, swelling, or exudate. Tongue normal, non-obstructing. Tonsils not swollen or erythematous. Hearing normal.  Neck: Supple, thyroid not palpable. No bruits, nodes or JVD. Respiratory: Respiratory effort normal.  BS equal and clear bilateral without rales, rhonci, wheezing or stridor. Cardio: Heart sounds are normal with regular rate and rhythm and no murmurs, rubs or gallops. Peripheral pulses are normal and equal bilaterally without edema. No aortic or femoral bruits. Chest: symmetric with normal excursions and percussion.  Abdomen: Soft, with Nl bowel  sounds. Nontender, no guarding, rebound, hernias, masses, or organomegaly.  Lymphatics: Non tender without lymphadenopathy.  Musculoskeletal: Full ROM all peripheral extremities, joint stability, 5/5 strength, and normal gait. Skin: Warm and dry without rashes, lesions, cyanosis, clubbing or  ecchymosis.  Neuro: Cranial nerves intact, reflexes equal bilaterally. Normal muscle tone, no cerebellar symptoms. Sensation intact.  Pysch: Alert and oriented X 3 with normal affect, insight and judgment appropriate.   Assessment and Plan  1. Annual Preventative/Screening Exam    2. Essential hypertension  - EKG 12-Lead - [place], RETROPERITNL ABD,  LTD - Urinalysis, Routine w reflex microscopic - Microalbumin / creatinine urine ratio - CBC with Differential/Platelet - COMPLETE METABOLIC PANEL WITH GFR - Magnesium - TSH  3. Hyperlipidemia associated with type 2 diabetes mellitus (HCC)  - EKG 12-Lead - [place], RETROPERITNL ABD,  LTD - Lipid panel - TSH  4. Type 2 diabetes mellitus with stage 2 chronic kidney  disease, without long-term current  use of insulin (HCC)  - EKG 12-Lead - [place], RETROPERITNL ABD,  LTD - HM DIABETES FOOT EXAM - LOW EXTREMITY NEUR EXAM DOCUM - Hemoglobin A1c - Insulin, random  5. Vitamin D deficiency  - VITAMIN D 25 Hydroxy   6. Testosterone deficiency  - Testosterone  7. B12 deficiency  - Vitamin B12  8. BPH with obstruction/lower urinary tract symptoms  - PSA  9. Prostate cancer screening  - PSA  10. Screening for colorectal cancer  - Ambulatory referral to Gastroenterology  11. Testosterone deficiency in male  - Testosterone  12. Screening for ischemic heart disease  - EKG 12-Lead  13. FHx: heart disease  - EKG 12-Lead - [place], RETROPERITNL ABD,  LTD  14. Screening for AAA (aortic abdominal aneurysm)  - [place], RETROPERITNL ABD,  LTD  15. Medication management  - Urinalysis, Routine w reflex microscopic - Microalbumin / creatinine urine ratio - Testosterone - CBC with Differential/Platelet - COMPLETE METABOLIC PANEL WITH GFR - Magnesium - Lipid panel - TSH - Hemoglobin A1c - Insulin, random       Patient was counseled in prudent diet, weight control to achieve/maintain BMI less than 25, BP monitoring, regular exercise and medications as discussed.  Discussed med effects and SE's. Routine screening labs and tests as requested with regular follow-up as recommended. Over 40 minutes of exam, counseling, chart review and high complex critical decision making was performed   Marinus Maw, MD

## 2020-11-02 NOTE — Progress Notes (Signed)
Patient presents to the office for a Testosterone Cypionate 200 mg/ml 2 ml IM injection in upper RIGHT outer quadrant. Patient tolerated well. 

## 2020-11-03 LAB — COMPLETE METABOLIC PANEL WITH GFR
AG Ratio: 1.5 (calc) (ref 1.0–2.5)
ALT: 25 U/L (ref 9–46)
AST: 19 U/L (ref 10–35)
Albumin: 4.3 g/dL (ref 3.6–5.1)
Alkaline phosphatase (APISO): 94 U/L (ref 35–144)
BUN: 21 mg/dL (ref 7–25)
CO2: 26 mmol/L (ref 20–32)
Calcium: 9 mg/dL (ref 8.6–10.3)
Chloride: 103 mmol/L (ref 98–110)
Creat: 0.98 mg/dL (ref 0.70–1.35)
Globulin: 2.8 g/dL (calc) (ref 1.9–3.7)
Glucose, Bld: 258 mg/dL — ABNORMAL HIGH (ref 65–99)
Potassium: 4.4 mmol/L (ref 3.5–5.3)
Sodium: 136 mmol/L (ref 135–146)
Total Bilirubin: 0.5 mg/dL (ref 0.2–1.2)
Total Protein: 7.1 g/dL (ref 6.1–8.1)
eGFR: 86 mL/min/{1.73_m2} (ref >=60)

## 2020-11-03 LAB — LIPID PANEL
Cholesterol: 164 mg/dL (ref <200)
HDL: 39 mg/dL — ABNORMAL LOW (ref >=40)
LDL Cholesterol (Calc): 89 mg/dL (calc)
Non-HDL Cholesterol (Calc): 125 mg/dL (calc) (ref <130)
Total CHOL/HDL Ratio: 4.2 (calc) (ref <5.0)
Triglycerides: 301 mg/dL — ABNORMAL HIGH (ref <150)

## 2020-11-03 LAB — URINALYSIS, ROUTINE W REFLEX MICROSCOPIC
Bilirubin Urine: NEGATIVE
Hgb urine dipstick: NEGATIVE
Ketones, ur: NEGATIVE
Leukocytes,Ua: NEGATIVE
Nitrite: NEGATIVE
Protein, ur: NEGATIVE
Specific Gravity, Urine: 1.043 — ABNORMAL HIGH (ref 1.001–1.035)
pH: 6 (ref 5.0–8.0)

## 2020-11-03 LAB — TSH: TSH: 1.25 mIU/L (ref 0.40–4.50)

## 2020-11-03 LAB — CBC WITH DIFFERENTIAL/PLATELET
Absolute Monocytes: 624 cells/uL (ref 200–950)
Basophils Absolute: 32 cells/uL (ref 0–200)
Basophils Relative: 0.4 %
Eosinophils Absolute: 224 cells/uL (ref 15–500)
Eosinophils Relative: 2.8 %
HCT: 48.4 % (ref 38.5–50.0)
Hemoglobin: 15.9 g/dL (ref 13.2–17.1)
Lymphs Abs: 2040 cells/uL (ref 850–3900)
MCH: 29.3 pg (ref 27.0–33.0)
MCHC: 32.9 g/dL (ref 32.0–36.0)
MCV: 89.3 fL (ref 80.0–100.0)
MPV: 11.5 fL (ref 7.5–12.5)
Monocytes Relative: 7.8 %
Neutro Abs: 5080 cells/uL (ref 1500–7800)
Neutrophils Relative %: 63.5 %
Platelets: 211 10*3/uL (ref 140–400)
RBC: 5.42 10*6/uL (ref 4.20–5.80)
RDW: 12.1 % (ref 11.0–15.0)
Total Lymphocyte: 25.5 %
WBC: 8 10*3/uL (ref 3.8–10.8)

## 2020-11-03 LAB — MICROALBUMIN / CREATININE URINE RATIO
Creatinine, Urine: 115 mg/dL (ref 20–320)
Microalb Creat Ratio: 5 mcg/mg creat (ref <30)
Microalb, Ur: 0.6 mg/dL

## 2020-11-03 LAB — HEMOGLOBIN A1C
Hgb A1c MFr Bld: 8.6 % of total Hgb — ABNORMAL HIGH (ref <5.7)
Mean Plasma Glucose: 200 mg/dL
eAG (mmol/L): 11.1 mmol/L

## 2020-11-03 LAB — INSULIN, RANDOM: Insulin: 15.5 u[IU]/mL

## 2020-11-03 LAB — TESTOSTERONE: Testosterone: 316 ng/dL (ref 250–827)

## 2020-11-03 LAB — MAGNESIUM: Magnesium: 2.1 mg/dL (ref 1.5–2.5)

## 2020-11-03 LAB — VITAMIN B12: Vitamin B-12: 459 pg/mL (ref 200–1100)

## 2020-11-03 LAB — PSA: PSA: 1.01 ng/mL (ref <=4.00)

## 2020-11-03 NOTE — Progress Notes (Signed)
============================================================ - Test results slightly outside the reference range are not unusual. If there is anything important, I will review this with you,  otherwise it is considered normal test values.  If you have further questions,  please do not hesitate to contact me at the office or via My Chart.  ============================================================ ============================================================  -  Vitamin D = 23 - is dangerously Low   - Vitamin D goal is between 70-100.   - Please make sure that you are taking your Vitamin D as directed.   - It is very important as a natural anti-inflammatory and helping the  immune system protect against viral infections, like the Covid-19    helping hair, skin, and nails, as well as reducing stroke and  heart attack risk.   - It helps your bones and helps with mood.  - It also decreases numerous cancer risks so please  take it as directed.   - Low Vit D is associated with a 200-300% higher risk for  CANCER   and 200-300% higher risk for HEART   ATTACK  &  STROKE.    - It is also associated with higher death rate at younger ages,   autoimmune diseases like Rheumatoid arthritis, Lupus,  Multiple Sclerosis.     - Also many other serious conditions, like depression, Alzheimer's  Dementia, infertility, muscle aches, fatigue, fibromyalgia   - just to name a few. ============================================================ ============================================================  - PSA - Low - Great  ============================================================ ============================================================  - Testosterone Low, but in low normal range ============================================================ ============================================================  - Glucose = 258 - Way to high          (  Ideal or goal is less than 130)   -  and   - A1c = 8.6 % still way too high   - Being diabetic has a  300% increased risk for heart attack,                                  stroke, cancer, and alzheimer- type vascular dementia.   - It is very important that you work harder with diet by                    avoiding all foods that are white except chicken,fish & calliflower.  - Avoid white rice  (brown & wild rice is OK),   - Avoid white potatoes  (sweet potatoes in moderation is OK),   White bread or wheat bread or anything made out of   white flour like bagels, donuts, rolls, buns, biscuits, cakes,  - pastries, cookies, pizza crust, and pasta (made from  white flour & egg whites)   - vegetarian pasta or spinach or wheat pasta is OK.  - Multigrain breads like Arnold's, Pepperidge Farm or   multigrain sandwich thins or high fiber breads like   [place] bread or "Dave's Killer" breads that are  4 to 5 grams fiber per slice !  are best.    Diet, exercise and weight loss can reverse and cure  diabetes in the early stages.    - Diet, exercise and weight loss is very important in the   control and prevention of complications of diabetes which  affects every system in your body, ie.   -Brain - dementia/stroke,  - eyes - glaucoma/blindness,  - heart - heart attack/heart failure,  - kidneys - dialysis,  - stomach - gastric  paralysis,  - intestines - malabsorption,  - nerves - severe painful neuritis,  - circulation - gangrene & loss of a leg(s)  - and finally  . . . . . . . . . . . . . . . . . .    - cancer and Alzheimers. ============================================================ ============================================================  - Total Chol = 164 - Great , But   - Triglycerides (  301   ) or fats in blood are too high                       (goal is less than 150)    - Recommend avoid fried & greasy foods,  sweets / candy,   - Avoid white rice  (brown or wild rice or Quinoa is OK),   -  Avoid white potatoes  (sweet potatoes are OK)   - Avoid anything made from white flour  - bagels, doughnuts, rolls, buns, biscuits, white and   wheat breads, pizza crust and traditional  pasta made of white flour & egg white  - (vegetarian pasta or spinach or wheat pasta is OK).    - Multi-grain bread is OK - like multi-grain flat bread or  sandwich thins.   - Avoid alcohol in excess.   - Exercise is also important. ============================================================ ============================================================  -

## 2020-11-06 ENCOUNTER — Encounter: Payer: Self-pay | Admitting: Internal Medicine

## 2020-11-24 ENCOUNTER — Other Ambulatory Visit: Payer: Self-pay

## 2020-11-24 ENCOUNTER — Other Ambulatory Visit: Payer: Self-pay | Admitting: Adult Health

## 2020-11-24 ENCOUNTER — Ambulatory Visit (INDEPENDENT_AMBULATORY_CARE_PROVIDER_SITE_OTHER): Payer: Medicare PPO

## 2020-11-24 DIAGNOSIS — E349 Endocrine disorder, unspecified: Secondary | ICD-10-CM

## 2020-11-24 MED ORDER — TESTOSTERONE CYPIONATE 200 MG/ML IM SOLN: INTRAMUSCULAR | 2 refills | Status: DC

## 2020-11-25 ENCOUNTER — Ambulatory Visit (INDEPENDENT_AMBULATORY_CARE_PROVIDER_SITE_OTHER): Payer: Medicare PPO

## 2020-11-25 VITALS — BP 140/82 | HR 84 | Temp 97.7°F | Wt 228.0 lb

## 2020-11-25 DIAGNOSIS — E349 Endocrine disorder, unspecified: Secondary | ICD-10-CM | POA: Diagnosis not present

## 2020-11-25 MED ORDER — TESTOSTERONE CYPIONATE 200 MG/ML IM SOLN
200.0000 mg | Freq: Once | INTRAMUSCULAR | Status: AC
  Administered 2020-11-25: 200 mg via INTRAMUSCULAR

## 2020-11-25 NOTE — Progress Notes (Signed)
Patient presents to the office for a Testosterone Cypionate 200 mg/ml 2 ml IM injection in upper LEFT outer quadrant. Patient tolerated well. 

## 2020-12-08 ENCOUNTER — Other Ambulatory Visit: Payer: Self-pay | Admitting: Internal Medicine

## 2020-12-08 MED ORDER — DEXAMETHASONE 4 MG PO TABS: ORAL_TABLET | ORAL | 0 refills | Status: DC

## 2020-12-16 ENCOUNTER — Telehealth: Payer: Self-pay

## 2020-12-16 ENCOUNTER — Ambulatory Visit (INDEPENDENT_AMBULATORY_CARE_PROVIDER_SITE_OTHER): Payer: Medicare PPO | Admitting: Nurse Practitioner

## 2020-12-16 ENCOUNTER — Other Ambulatory Visit: Payer: Self-pay

## 2020-12-16 ENCOUNTER — Encounter: Payer: Self-pay | Admitting: Nurse Practitioner

## 2020-12-16 VITALS — BP 140/82 | HR 92 | Temp 97.7°F | Wt 224.0 lb

## 2020-12-16 DIAGNOSIS — J069 Acute upper respiratory infection, unspecified: Secondary | ICD-10-CM | POA: Diagnosis not present

## 2020-12-16 DIAGNOSIS — R6889 Other general symptoms and signs: Secondary | ICD-10-CM

## 2020-12-16 DIAGNOSIS — N182 Chronic kidney disease, stage 2 (mild): Secondary | ICD-10-CM

## 2020-12-16 DIAGNOSIS — E1122 Type 2 diabetes mellitus with diabetic chronic kidney disease: Secondary | ICD-10-CM

## 2020-12-16 DIAGNOSIS — Z1152 Encounter for screening for COVID-19: Secondary | ICD-10-CM

## 2020-12-16 LAB — POCT INFLUENZA A/B
Influenza A, POC: NEGATIVE
Influenza B, POC: NEGATIVE

## 2020-12-16 LAB — POC COVID19 BINAXNOW: SARS Coronavirus 2 Ag: NEGATIVE

## 2020-12-16 MED ORDER — BENZONATATE 100 MG PO CAPS: 200.0000 mg | ORAL_CAPSULE | Freq: Three times a day (TID) | ORAL | 0 refills | Status: DC | PRN

## 2020-12-16 MED ORDER — GLIPIZIDE 5 MG PO TABS: 5.0000 mg | ORAL_TABLET | Freq: Two times a day (BID) | ORAL | 1 refills | Status: DC

## 2020-12-16 MED ORDER — DEXAMETHASONE 1 MG PO TABS: ORAL_TABLET | ORAL | 0 refills | Status: DC

## 2020-12-16 MED ORDER — IPRATROPIUM BROMIDE HFA 17 MCG/ACT IN AERS: 2.0000 | INHALATION_SPRAY | Freq: Four times a day (QID) | RESPIRATORY_TRACT | 0 refills | Status: DC

## 2020-12-16 NOTE — Telephone Encounter (Signed)
Patient called and said his blood sugar was at 500 when he came in this morning. HE drank water and went for a walk and got it down to 250. He thinks his blood sugar medication needs to be changed.

## 2020-12-16 NOTE — Patient Instructions (Signed)
Upper Respiratory Infection, Adult An upper respiratory infection (URI) is a common viral infection of the nose, throat, and upper air passages that lead to the lungs. The most common type of URI is the common cold. URIs usually get better on their own, without medical treatment. What are the causes? A URI is caused by a virus. You may catch a virus by: Breathing in droplets from an infected person's cough or sneeze. Touching something that has been exposed to the virus (contaminated) and then touching your mouth, nose, or eyes. What increases the risk? You are more likely to get a URI if: You are very young or very old. It is autumn or winter. You have close contact with others, such as at a daycare, school, or health care facility. You smoke. You have long-term (chronic) heart or lung disease. You have a weakened disease-fighting (immune) system. You have nasal allergies or asthma. You are experiencing a lot of stress. You work in an area that has poor air circulation. You have poor nutrition. What are the signs or symptoms? A URI usually involves some of the following symptoms: Runny or stuffy (congested) nose. Sneezing. Cough. Sore throat. Headache. Fatigue. Fever. Loss of appetite. Pain in your forehead, behind your eyes, and over your cheekbones (sinus pain). Muscle aches. Redness or irritation of the eyes. Pressure in the ears or face. How is this diagnosed? This condition may be diagnosed based on your medical history and symptoms, and a physical exam. Your health care provider may use a cotton swab to take a mucus sample from your nose (nasal swab). This sample can be tested to determine what virus is causing the illness. How is this treated? URIs usually get better on their own within 7-10 days. You can take steps at home to relieve your symptoms. Medicines cannot cure URIs, but your health care provider may recommend certain medicines to help relieve symptoms, such  as: Over-the-counter cold medicines. Cough suppressants. Coughing is a type of defense against infection that helps to clear the respiratory system, so take these medicines only as recommended by your health care provider. Fever-reducing medicines. Follow these instructions at home: Activity Rest as needed. If you have a fever, stay home from work or school until your fever is gone or until your health care provider says you are no longer contagious. Your health care provider may have you wear a face mask to prevent your infection from spreading. Relieving symptoms Gargle with a salt-water mixture 3-4 times a day or as needed. To make a salt-water mixture, completely dissolve -1 tsp of salt in 1 cup of warm water. Use a cool-mist humidifier to add moisture to the air. This can help you breathe more easily. Eating and drinking  Drink enough fluid to keep your urine pale yellow. Eat soups and other clear broths. General instructions  Take over-the-counter and prescription medicines only as told by your health care provider. These include cold medicines, fever reducers, and cough suppressants. Do not use any products that contain nicotine or tobacco, such as cigarettes and e-cigarettes. If you need help quitting, ask your health care provider. Stay away from secondhand smoke. Stay up to date on all immunizations, including the yearly (annual) flu vaccine. Keep all follow-up visits as told by your health care provider. This is important. How to prevent the spread of infection to others  URIs can be passed from person to person (are contagious). To prevent the infection from spreading: Wash your hands often with soap and   water. If soap and water are not available, use hand sanitizer. Avoid touching your mouth, face, eyes, or nose. Cough or sneeze into a tissue or your sleeve or elbow instead of into your hand or into the air. Contact a health care provider if: You are getting worse instead  of better. You have a fever or chills. Your mucus is brown or red. You have yellow or brown discharge coming from your nose. You have pain in your face, especially when you bend forward. You have swollen neck glands. You have pain while swallowing. You have white areas in the back of your throat. Get help right away if: You have shortness of breath that gets worse. You have severe or persistent: Headache. Ear pain. Sinus pain. Chest pain. You have chronic lung disease along with any of the following: Wheezing. Prolonged cough. Coughing up blood. A change in your usual mucus. You have a stiff neck. You have changes in your: Vision. Hearing. Thinking. Mood. Summary An upper respiratory infection (URI) is a common infection of the nose, throat, and upper air passages that lead to the lungs. A URI is caused by a virus. URIs usually get better on their own within 7-10 days. Medicines cannot cure URIs, but your health care provider may recommend certain medicines to help relieve symptoms. This information is not intended to replace advice given to you by your health care provider. Make sure you discuss any questions you have with your health care provider. Document Revised: 11/20/2019 Document Reviewed: 11/20/2019 Elsevier Patient Education  2022 [employer]  

## 2020-12-16 NOTE — Addendum Note (Signed)
Addended by: Revonda Humphrey on: 12/16/2020 04:14 PM   Modules accepted: Orders

## 2020-12-16 NOTE — Telephone Encounter (Signed)
Make sure he is taking Metformin 500mg  2 tabs twice a day.  Check blood sugar fasting and 2 hours after dinner daily. Schedule appt next week to adjust medications

## 2020-12-16 NOTE — Progress Notes (Addendum)
Assessment and Plan:  Mark [name] was seen today for acute visit.  Diagnoses and all orders for this visit:  Flu-like symptoms -     POCT Influenza A/B  Encounter for screening for COVID-19 -     POC COVID-19  URI, acute -     dexamethasone (DECADRON) 1 MG tablet; Take 3 tabs for 3 days, 2 tabs for 3 days 1 tab for 5 days. Take with food. -     benzonatate (TESSALON PERLES) 100 MG capsule; Take 2 capsules (200 mg total) by mouth 3 (three) times daily as needed for cough (Max: 6110m per day).  Type 2 diabetes mellitus with stage 2 chronic kidney disease, without long-term current use of insulin (HCC) -     glipiZIDE (GLUCOTROL) 5 MG tablet; Take 1 tablet (5 mg total) by mouth 2 (two) times daily.  Pt got home today and tested his blood sugar and was 500. Said after drinking water and walking his blood sugar went down to 250. Advised to not take steroids and will add glipizide to Metformin for now  Would not come tomorrow to review Ozempic or other medications.  To keep BS log fasting daily.  Will also add albuterol inhaler.  TO SCHEDULE APPT FOR 2 WEEKS TO READJUST BLOOD SUGAR MEDICATIONS        Further disposition pending results of labs. Discussed med's effects and SE's.   Over 30 minutes of exam, counseling, chart review, and critical decision making was performed.   Future Appointments  Date Time Provider [place] 08/01/2021  4:00 PM CLiane Comber NP [facility] None    ------------------------------------------------------------------------------------------------------------------   HPI BP 140/82   Pulse 92   Temp 97.7 F (36.5 C)   Wt 224 lb (101.6 kg)   SpO2 92%   BMI 29.88 kg/m  65 y.o.male presents for Cough  Coughing up clear mucus x 7 days.  Worse in am.  Denies sore throat, fever , chills congestion, nausea, vomiting and diarrhea. Has been craving fluids- drinking lots more water.  Past Medical History:  Diagnosis Date   GERD (gastroesophageal  reflux disease)    Hyperlipidemia    Hypertension    Other testicular hypofunction    Prediabetes    Vitamin D deficiency      Allergies  Allergen Reactions   Codeine     REACTION: Anxiety    Current Outpatient Medications on File Prior to Visit  Medication Sig   aspirin 81 MG chewable tablet Chew by mouth daily.   blood glucose meter kit and supplies KIT Dispense based on patient and insurance preference. Use up to four times daily as directed. (FOR ICD-9 250.00, 250.01).   Cholecalciferol 1000 units capsule Take 10,000 Units by mouth daily.   Cyanocobalamin (VITAMIN B-12 PO) Take by mouth daily.   finasteride (PROSCAR) 5 MG tablet Take 1 tablet (5 mg total) by mouth daily.   glucose blood test strip Check blood glucose before meals & bedtime time and as needed.   metFORMIN (GLUCOPHAGE-XR) 500 MG 24 hr tablet Take  2 tablets  2 x /day  with Meals for Diabetes   rosuvastatin (CRESTOR) 10 MG tablet Take one tablet every night for cholesterol.   tamsulosin (FLOMAX) 0.4 MG CAPS capsule TAKE 1 CAPSULE BY MOUTH AT BEDTIME FOR PROSTATE   telmisartan (MICARDIS) 20 MG tablet Take 1 tablet Daily for BP   testosterone cypionate (DEPOTESTOSTERONE CYPIONATE) 200 MG/ML injection Inject 2 ml into muscle every 2 weeks   Current Facility-Administered  Medications on File Prior to Visit  Medication   testosterone cypionate (DEPOTESTOSTERONE CYPIONATE) injection 200 mg    ROS: all negative except above.   Physical Exam:  BP 140/82   Pulse 92   Temp 97.7 F (36.5 C)   Wt 224 lb (101.6 kg)   SpO2 92%   BMI 29.88 kg/m   General Appearance: Well nourished, in no apparent distress. Eyes: PERRLA, EOMs, conjunctiva no swelling or erythema Sinuses: No Frontal/maxillary tenderness ENT/Mouth: Ext aud canals clear, TMs without erythema, bulging. No erythema, swelling, or exudate on post pharynx.  Tonsils not swollen or erythematous. Hearing normal.  Neck: Supple, thyroid normal.  Respiratory:  Respiratory effort normal, BS equal bilaterally without rales. Wheezing noted left lower lobe.  Cardio: RRR with no MRGs. Brisk peripheral pulses without edema.  Abdomen: Soft, + BS.  Non tender, no guarding, rebound, hernias, masses. Lymphatics: Non tender without lymphadenopathy.  Musculoskeletal: Full ROM, 5/5 strength, normal gait.  Skin: Warm, dry without rashes, lesions, ecchymosis.  Neuro: Cranial nerves intact. Normal muscle tone, no cerebellar symptoms. Sensation intact.  Psych: Awake and oriented X 3, normal affect, Insight and Judgment appropriate.     Magda Bernheim, NP 10:36 AM [facility]

## 2020-12-27 ENCOUNTER — Other Ambulatory Visit: Payer: Self-pay

## 2020-12-27 ENCOUNTER — Ambulatory Visit (INDEPENDENT_AMBULATORY_CARE_PROVIDER_SITE_OTHER): Payer: Medicare PPO

## 2020-12-27 VITALS — BP 124/80 | HR 90 | Temp 97.3°F | Wt 225.0 lb

## 2020-12-27 DIAGNOSIS — E349 Endocrine disorder, unspecified: Secondary | ICD-10-CM

## 2020-12-27 DIAGNOSIS — N401 Enlarged prostate with lower urinary tract symptoms: Secondary | ICD-10-CM | POA: Diagnosis not present

## 2020-12-27 DIAGNOSIS — N138 Other obstructive and reflux uropathy: Secondary | ICD-10-CM

## 2020-12-27 MED ORDER — TESTOSTERONE CYPIONATE 200 MG/ML IM SOLN
200.0000 mg | Freq: Once | INTRAMUSCULAR | Status: AC
  Administered 2020-12-27: 200 mg via INTRAMUSCULAR

## 2020-12-27 NOTE — Progress Notes (Signed)
Patient presents to the office for a Testosterone Cypionate 200 mg/ml 2 ml IM injection in upper RIGHT outer quadrant. Patient tolerated well. 

## 2020-12-30 ENCOUNTER — Ambulatory Visit: Payer: Medicare PPO | Admitting: Nurse Practitioner

## 2021-01-02 ENCOUNTER — Other Ambulatory Visit: Payer: Self-pay | Admitting: Internal Medicine

## 2021-01-25 ENCOUNTER — Other Ambulatory Visit: Payer: Self-pay

## 2021-01-25 ENCOUNTER — Ambulatory Visit (INDEPENDENT_AMBULATORY_CARE_PROVIDER_SITE_OTHER): Payer: Medicare PPO

## 2021-01-25 VITALS — BP 160/80 | HR 97 | Temp 97.9°F | Resp 16 | Wt 220.0 lb

## 2021-01-25 DIAGNOSIS — N138 Other obstructive and reflux uropathy: Secondary | ICD-10-CM

## 2021-01-25 DIAGNOSIS — E349 Endocrine disorder, unspecified: Secondary | ICD-10-CM

## 2021-01-25 DIAGNOSIS — N401 Enlarged prostate with lower urinary tract symptoms: Secondary | ICD-10-CM

## 2021-01-25 MED ORDER — TESTOSTERONE CYPIONATE 200 MG/ML IM SOLN
200.0000 mg | Freq: Once | INTRAMUSCULAR | Status: AC
  Administered 2021-01-25: 200 mg via INTRAMUSCULAR

## 2021-01-25 NOTE — Progress Notes (Signed)
Patient presents to the office for a Testosterone Cypionate 200 mg/ml 2 ml IM injection in upper LEFT outer quadrant. Patient tolerated well. 

## 2021-02-28 ENCOUNTER — Ambulatory Visit (INDEPENDENT_AMBULATORY_CARE_PROVIDER_SITE_OTHER): Payer: Medicare PPO

## 2021-02-28 ENCOUNTER — Telehealth: Payer: Self-pay

## 2021-02-28 ENCOUNTER — Other Ambulatory Visit: Payer: Self-pay

## 2021-02-28 VITALS — BP 120/84 | HR 77 | Temp 97.7°F | Wt 217.0 lb

## 2021-02-28 DIAGNOSIS — R350 Frequency of micturition: Secondary | ICD-10-CM

## 2021-02-28 DIAGNOSIS — E1122 Type 2 diabetes mellitus with diabetic chronic kidney disease: Secondary | ICD-10-CM

## 2021-02-28 DIAGNOSIS — N401 Enlarged prostate with lower urinary tract symptoms: Secondary | ICD-10-CM

## 2021-02-28 DIAGNOSIS — N182 Chronic kidney disease, stage 2 (mild): Secondary | ICD-10-CM

## 2021-02-28 MED ORDER — TESTOSTERONE CYPIONATE 200 MG/ML IM SOLN
200.0000 mg | Freq: Once | INTRAMUSCULAR | Status: AC
  Administered 2021-02-28: 200 mg via INTRAMUSCULAR

## 2021-02-28 MED ORDER — GLUCOSE BLOOD VI STRP: ORAL_STRIP | 3 refills | Status: DC

## 2021-02-28 NOTE — Progress Notes (Signed)
Patient presents to the office for a Testosterone Cypionate 200 mg/ml 2 ml IM injection in upper RIGHT outer quadrant. Patient tolerated well. 

## 2021-02-28 NOTE — Telephone Encounter (Signed)
Requesting a refill on Jardiance. Ok to send in?

## 2021-03-03 ENCOUNTER — Other Ambulatory Visit: Payer: Self-pay

## 2021-03-24 ENCOUNTER — Ambulatory Visit: Payer: Medicare PPO

## 2021-03-25 NOTE — Progress Notes (Signed)
THIS ENCOUNTER IS A VIRTUAL/TELEPHONE VISIT DUE TO COVID-19 -  HE ARRIVED REPORTING BODY ACHES, COUGH, tested + for covid 19  PATIENT WAS NOT SEEN BY THE PROVIDER IN THE OFFICE. PATIENT HAS CONSENTED TO VIRTUAL VISIT / TELEMEDICINE VISIT  This provider placed a call to Mark [name] using telephone, his appointment was changed to a virtual office visit to reduce the risk of exposure to the COVID-19 virus and to help Mark [name] remain healthy and safe. The virtual visit will also provide continuity of care. He verbalizes understanding.     3 MONTH FOLLOW UP AND ACUTE VISIT Assessment:     Primary hypertension Continue medication Monitor blood pressure at home; call if consistently over 130/80 Continue DASH diet.   Reminder to go to the ER if any CP, SOB, nausea, dizziness, severe HA, changes vision/speech, left arm numbness and tingling and jaw pain. -     CBC with Differential/Platelet -     COMPLETE METABOLIC PANEL WITH GFR -     Magnesium  Type 2 diabetes mellitus with stage 2 chronic [name] disease, without long-term current use of insulin ([place]) Education: Reviewed ABCs of diabetes management (respective goals in parentheses):  A1C (<7), blood pressure (<130/80), and cholesterol (LDL <70) Eye Exam yearly (report requested) and Dental Exam every 6 months. Dietary recommendations Physical Activity recommendations - resent metformin, jardiance; short term add glipizide 5 mg TID PRN glucose 150+ due to hypergylcemia; mechanism and risks of hypoglycemia reviewed; titrate up gradually, always check glucose prior to taking, take with food, close follow up in 2-4 weeks -     COMPLETE METABOLIC PANEL WITH GFR -     Hemoglobin A1c  Hyperlipidemia associated with type 2 diabetes mellitus ([place]) Currently out/off of statin- due to paxlovid will not restart until 2 weeks after completion LDL goal <70, titrate for goal  Continue low cholesterol diet and exercise.  Check lipid panel.   -     Lipid panel -     TSH  CKD stage 2 due to type 2 diabetes mellitus (HCC) Increase fluids, avoid NSAIDS, monitor sugars, will monitor On ARB, jardiance -     COMPLETE METABOLIC PANEL WITH GFR  Gastroesophageal reflux disease, unspecified whether esophagitis present Recently controlled off of meds; lifestyle reviewed   B12 deficiency Continue supplement;   Vitamin D deficiency -     VITAMIN D 25 Hydroxy (Vit-D Deficiency, Fractures)  Obesity (BMI 30.0-34.9) Long discussion about weight loss, diet, and exercise Recommended diet heavy in fruits and veggies and low in animal meats, cheeses, and dairy products, appropriate calorie intake Discussed appropriate weight for height and initial goal (<210lb ) Follow up at next visit  Benign prostatic hyperplasia with urinary frequency Continue meds; well controlled; check PSA annually at CPE  Testosterone deficiency Continued perceived benefits with supplementation, no SE. Check level PRN, continue zinc, no symptoms of elevated estrogen or dihydrotestosterone.   Medication management -     CBC with Differential/Platelet -     COMPLETE METABOLIC PANEL WITH GFR -     Magnesium  Defer labs, OV converted to tele due to + covid 19 here in office, reschedule follow up OV in 2-4 weeks.   Covid 19 Covid 19 positive per rapid screening test in unvaccinated adult Risk factors include: age 40+, htn, T2DM, unvaccinated Symptoms are: mild Due to co morbid conditions and risk factors, patient was offered antiviral, paxlovid discussed as preferred agent; GFR reviewed; he has been off of statin,  will not restart until 2 weeks after completion. Paxlovid may reduce risk of severe illness and hospitalization by up to 80%. He would like to proceed. Interactions and potential SE discussed. Follow up if any concerns. Immue support with vitamin D 1000 mg daily, zinc 50 mg daily, vitamin D Reviewed doing regular breathing exercises, proning Take  tylenol PRN temp 101+ Push hydration Regular ambulation or calf exercises exercises for clot prevention and 81 mg ASA unless contraindicated Sx supportive therapy suggested Follow up via mychart or telephone if needed Advised patient obtain O2 monitor; present to ED if persistently <88% or with severe dyspnea, CP, fever uncontrolled by tylenol, confusion, sudden decline Should remain in isolation until at least 10 days from onset of sx, 24-48 hours fever free without tylenol, sx such as cough are improved.  -     nirmatrelvir/ritonavir EUA (PAXLOVID) 20 x 150 MG & 10 x 100MG TABS; Take 3 tablets by mouth 2 (two) times daily for 5 days. Patient GFR is 86. Take nirmatrelvir (150 mg) two tablets twice daily for 5 days and ritonavir (100 mg) one tablet twice daily for 5 days. -     promethazine-dextromethorphan (PROMETHAZINE-DM) 6.25-15 MG/5ML syrup; Take 5 mLs by mouth 4 (four) times daily as needed for cough.   -     metFORMIN (GLUCOPHAGE-XR) 500 MG 24 hr tablet; Take  2 tablets  2 x /day  with Meals for Diabetes -     glipiZIDE (GLUCOTROL) 5 MG tablet; Take 1/2-2 tab three times a day with meals for elevated blood sugars 150+. -     telmisartan (MICARDIS) 20 MG tablet; Take 1 tablet Daily for BP -     empagliflozin (JARDIANCE) 25 MG TABS tablet; Take 1 tablet (25 mg total) by mouth daily.   Over 20 minutes of non-face to face time, counseling, chart review, and critical decision making was performed  Future Appointments  Date Time Provider [place]  08/01/2021  4:00 PM Magda Bernheim, NP [facility] None     Subjective:  Mark [name] is a 65 y.o. male who presents for  3 month follow up for HTN, hyperlipidemia, T2DM, and vitamin D Def. Upon arrival he reported body aches, fatigue, cough to [facility] and tested + for rapid covid 19, negative for flu. OV was converted to telephone to minimize exposure to staff. He was advised to reschedule overdue follow up for T2DM and chronic conditions  within 1 month. He is not vaccinated.   He reports didn't sleep well, woke up feeling body aches, HA, mild cough, otherwise denies dyspnea, wheezing, fatigue, fever/chills.  He has not tried any treatments.   BMI is Body mass index is 29.21 kg/m., he has been working on diet and exercise, was going to gym 45 min daily.  Wt Readings from Last 3 Encounters:  03/29/21 219 lb (99.3 kg)  02/28/21 217 lb (98.4 kg)  01/25/21 220 lb (99.8 kg)   His blood pressure has been controlled at home, today their BP is BP: 130/82 He does workout. He denies chest pain, shortness of breath, dizziness.   He is on cholesterol medication (rosuvastatin 5 mg daily, newly on this daily) and denies myalgias. His cholesterol is not at goal. The cholesterol last visit was:   Lab Results  Component Value Date   CHOL 164 11/02/2020   HDL 39 (L) 11/02/2020   LDLCALC 89 11/02/2020   TRIG 301 (H) 11/02/2020   CHOLHDL 4.2 11/02/2020   He has been working  on diet and exercise for T2DM with CKD II, on Metformin 2000 mg daily, jardiance 25 mg, on ASA and ARB and denies hyperglycemia, hypoglycemia , increased appetite, nausea, paresthesia of the feet, polydipsia, polyuria and visual disturbances.  Reports fasting typically around 110-130 but reports recently has been running 200+ despite "not eating anything bad."  Last A1C in the office was:  Lab Results  Component Value Date   HGBA1C 8.6 (H) 11/02/2020   Last GFR Lab Results  Component Value Date   GFRNONAA 65 07/29/2020    Patient is on Vitamin D supplement, taking 5000 IU recently.  Lab Results  Component Value Date   VD25OH 23 (L) 11/02/2020     He has a history of testosterone deficiency and is on testosterone replacement. He is taking 400 cc q74m he was due today. He states that the testosterone helps with his energy, libido, muscle mass. Denies symptoms of elevated estrogen or dihydrotestosterone.  Lab Results  Component Value Date   TESTOSTERONE 316  11/02/2020   Also prescribed finasteride 5 mg, tamsulosin daily for BPH, reports doing well.      Medication Review:  Current Outpatient Medications (Endocrine & Metabolic):    empagliflozin (JARDIANCE) 25 MG TABS tablet, Take by mouth daily.   metFORMIN (GLUCOPHAGE-XR) 500 MG 24 hr tablet, Take  2 tablets  2 x /day  with Meals for Diabetes   testosterone cypionate (DEPOTESTOSTERONE CYPIONATE) 200 MG/ML injection, Inject 2 ml into muscle every 2 weeks   glipiZIDE (GLUCOTROL) 5 MG tablet, Take 1 tablet (5 mg total) by mouth 2 (two) times daily. (Patient not taking: Reported on 03/29/2021)  Current Facility-Administered Medications (Endocrine & Metabolic):    testosterone cypionate (DEPOTESTOSTERONE CYPIONATE) injection 200 mg  Current Outpatient Medications (Cardiovascular):    rosuvastatin (CRESTOR) 10 MG tablet, Take one tablet every night for cholesterol.   telmisartan (MICARDIS) 20 MG tablet, Take 1 tablet Daily for BP   Current Outpatient Medications (Respiratory):    ipratropium (ATROVENT HFA) 17 MCG/ACT inhaler, Inhale 2 puffs into the lungs 4 (four) times daily.   Current Outpatient Medications (Analgesics):    aspirin 81 MG chewable tablet, Chew by mouth daily.   Current Outpatient Medications (Hematological):    Cyanocobalamin (VITAMIN B-12 PO), Take by mouth daily.   Current Outpatient Medications (Other):    blood glucose meter kit and supplies KIT, Dispense based on patient and insurance preference. Use up to four times daily as directed. (FOR ICD-9 250.00, 250.01).   Cholecalciferol 1000 units capsule, Take 10,000 Units by mouth daily.   finasteride (PROSCAR) 5 MG tablet, Take 1 tablet (5 mg total) by mouth daily.   glucose blood test strip, Check blood glucose before meals & bedtime time and as needed.   tamsulosin (FLOMAX) 0.4 MG CAPS capsule, TAKE 1 CAPSULE BY MOUTH AT BEDTIME FOR PROSTATE   Allergies: Allergies  Allergen Reactions   Codeine     REACTION:  Anxiety    Current Problems (verified) has Hyperlipidemia associated with type 2 diabetes mellitus ([place]; Hypertension; Type 2 diabetes mellitus ([place]; GERD (gastroesophageal reflux disease); Vitamin D deficiency; Testosterone deficiency; Obesity (BMI 30.0-34.9); Medication management; Benign prostatic hyperplasia with urinary frequency; CKD stage 2 due to type 2 diabetes mellitus ([place]; B12 deficiency; and History of colon polyps on their problem list.  Surgical: He  has a past surgical history that includes Vasectomy (01/2004) and Urethral dilation (2007). Family His family history includes COPD in his father; Heart disease in his father; Hyperlipidemia in his  mother; Hypertension in his father and mother. Social history  He reports that he has never smoked. He has never used smokeless tobacco. He reports that he does not drink alcohol and does not use drugs.   Review of Systems  Constitutional:  Positive for malaise/fatigue. Negative for weight loss.  HENT:  Negative for congestion, hearing loss, sinus pain, sore throat and tinnitus.   Eyes:  Negative for blurred vision and double vision.  Respiratory:  Positive for cough. Negative for shortness of breath, wheezing and stridor.   Cardiovascular:  Negative for chest pain, palpitations, orthopnea, claudication and leg swelling.  Gastrointestinal:  Negative for abdominal pain, blood in stool, constipation, diarrhea, heartburn, melena, nausea and vomiting.  Genitourinary: Negative.   Musculoskeletal:  Positive for myalgias. Negative for joint pain.  Skin:  Negative for rash.  Neurological:  Positive for headaches. Negative for dizziness, tingling, sensory change and weakness.  Endo/Heme/Allergies:  Negative for polydipsia.  Psychiatric/Behavioral: Negative.    All other systems reviewed and are negative.   Objective:   Today's Vitals   03/29/21 1054  BP: 130/82  Pulse: 84  Temp: 98.2 F (36.8 C)  SpO2: 97%  Weight: 219 lb (99.3  kg)    Body mass index is 29.21 kg/m.  General : Well sounding patient in no apparent distress HEENT: no hoarseness, no cough for duration of visit Lungs: speaks in complete sentences, no audible wheezing, no apparent distress Neurological: alert, oriented x 3 Psychiatric: pleasant, judgement appropriate    Izora Ribas, NP   03/29/2021

## 2021-03-29 ENCOUNTER — Ambulatory Visit: Payer: Medicare PPO | Admitting: Adult Health

## 2021-03-29 ENCOUNTER — Other Ambulatory Visit: Payer: Self-pay

## 2021-03-29 ENCOUNTER — Encounter: Payer: Self-pay | Admitting: Adult Health

## 2021-03-29 VITALS — BP 130/82 | HR 84 | Temp 98.2°F | Wt 219.0 lb

## 2021-03-29 DIAGNOSIS — Z79899 Other long term (current) drug therapy: Secondary | ICD-10-CM | POA: Diagnosis not present

## 2021-03-29 DIAGNOSIS — Z1152 Encounter for screening for COVID-19: Secondary | ICD-10-CM

## 2021-03-29 DIAGNOSIS — N182 Chronic kidney disease, stage 2 (mild): Secondary | ICD-10-CM

## 2021-03-29 DIAGNOSIS — E349 Endocrine disorder, unspecified: Secondary | ICD-10-CM

## 2021-03-29 DIAGNOSIS — E1169 Type 2 diabetes mellitus with other specified complication: Secondary | ICD-10-CM | POA: Diagnosis not present

## 2021-03-29 DIAGNOSIS — E669 Obesity, unspecified: Secondary | ICD-10-CM | POA: Diagnosis not present

## 2021-03-29 DIAGNOSIS — E1122 Type 2 diabetes mellitus with diabetic chronic kidney disease: Secondary | ICD-10-CM | POA: Diagnosis not present

## 2021-03-29 DIAGNOSIS — R6889 Other general symptoms and signs: Secondary | ICD-10-CM | POA: Diagnosis not present

## 2021-03-29 DIAGNOSIS — U071 COVID-19: Secondary | ICD-10-CM

## 2021-03-29 DIAGNOSIS — I10 Essential (primary) hypertension: Secondary | ICD-10-CM | POA: Diagnosis not present

## 2021-03-29 DIAGNOSIS — E785 Hyperlipidemia, unspecified: Secondary | ICD-10-CM

## 2021-03-29 LAB — POCT INFLUENZA A/B
Influenza A, POC: NEGATIVE
Influenza B, POC: NEGATIVE

## 2021-03-29 LAB — POC COVID19 BINAXNOW: SARS Coronavirus 2 Ag: POSITIVE — AB

## 2021-03-29 MED ORDER — NIRMATRELVIR/RITONAVIR (PAXLOVID)TABLET: 3.0000 | ORAL_TABLET | Freq: Two times a day (BID) | ORAL | 0 refills | Status: AC

## 2021-03-29 MED ORDER — GLIPIZIDE 5 MG PO TABS: ORAL_TABLET | ORAL | 1 refills | Status: DC

## 2021-03-29 MED ORDER — TELMISARTAN 20 MG PO TABS: ORAL_TABLET | ORAL | 0 refills | Status: DC

## 2021-03-29 MED ORDER — PROMETHAZINE-DM 6.25-15 MG/5ML PO SYRP: 5.0000 mL | ORAL_SOLUTION | Freq: Four times a day (QID) | ORAL | 1 refills | Status: DC | PRN

## 2021-03-29 MED ORDER — METFORMIN HCL ER 500 MG PO TB24: ORAL_TABLET | ORAL | 3 refills | Status: DC

## 2021-03-29 MED ORDER — EMPAGLIFLOZIN 25 MG PO TABS: 25.0000 mg | ORAL_TABLET | Freq: Every day | ORAL | 1 refills | Status: DC

## 2021-03-29 NOTE — Addendum Note (Signed)
Addended by: Dionicio Stall on: 03/29/2021 11:52 AM   Modules accepted: Orders

## 2021-04-06 ENCOUNTER — Ambulatory Visit (INDEPENDENT_AMBULATORY_CARE_PROVIDER_SITE_OTHER): Payer: Medicare PPO

## 2021-04-06 ENCOUNTER — Other Ambulatory Visit: Payer: Self-pay

## 2021-04-06 VITALS — BP 130/78 | HR 82 | Temp 97.5°F | Wt 212.2 lb

## 2021-04-06 DIAGNOSIS — E349 Endocrine disorder, unspecified: Secondary | ICD-10-CM | POA: Diagnosis not present

## 2021-04-06 NOTE — Progress Notes (Signed)
Patient presents to the office for a Testosterone Cypionate 200 mg/ml 2 ml IM injection in upper LEFT outer quadrant. Patient tolerated well. 

## 2021-04-14 ENCOUNTER — Ambulatory Visit: Payer: Medicare PPO | Admitting: Adult Health

## 2021-04-14 ENCOUNTER — Encounter: Payer: Self-pay | Admitting: Adult Health

## 2021-04-14 ENCOUNTER — Other Ambulatory Visit: Payer: Self-pay

## 2021-04-14 VITALS — BP 138/88 | HR 89 | Temp 97.8°F | Resp 16 | Ht 72.06 in | Wt 217.6 lb

## 2021-04-14 DIAGNOSIS — E1169 Type 2 diabetes mellitus with other specified complication: Secondary | ICD-10-CM | POA: Diagnosis not present

## 2021-04-14 DIAGNOSIS — E349 Endocrine disorder, unspecified: Secondary | ICD-10-CM | POA: Diagnosis not present

## 2021-04-14 DIAGNOSIS — E785 Hyperlipidemia, unspecified: Secondary | ICD-10-CM

## 2021-04-14 DIAGNOSIS — Z79899 Other long term (current) drug therapy: Secondary | ICD-10-CM

## 2021-04-14 DIAGNOSIS — E1122 Type 2 diabetes mellitus with diabetic chronic kidney disease: Secondary | ICD-10-CM | POA: Diagnosis not present

## 2021-04-14 DIAGNOSIS — I10 Essential (primary) hypertension: Secondary | ICD-10-CM | POA: Diagnosis not present

## 2021-04-14 DIAGNOSIS — E669 Obesity, unspecified: Secondary | ICD-10-CM

## 2021-04-14 DIAGNOSIS — E559 Vitamin D deficiency, unspecified: Secondary | ICD-10-CM

## 2021-04-14 DIAGNOSIS — N182 Chronic kidney disease, stage 2 (mild): Secondary | ICD-10-CM

## 2021-04-14 MED ORDER — METFORMIN HCL ER 500 MG PO TB24: ORAL_TABLET | ORAL | 3 refills | Status: DC

## 2021-04-14 NOTE — Progress Notes (Signed)
3 MONTH FOLLOW UP  Assessment:     Primary hypertension Off of telmisartan, improved with jardiance Monitor blood pressure at home; call if consistently over 130/80 Continue DASH diet.   Reminder to go to the ER if any CP, SOB, nausea, dizziness, severe HA, changes vision/speech, left arm numbness and tingling and jaw pain. -     CBC with Differential/Platelet -     COMPLETE METABOLIC PANEL WITH GFR -     Magnesium  Type 2 diabetes mellitus with stage 2 chronic kidney disease, without long-term current use of insulin ([place]) Education: Reviewed ABCs of diabetes management (respective goals in parentheses):  A1C (<7), blood pressure (<130/80), and cholesterol (LDL <70) Eye Exam yearly (report requested) and Dental Exam every 6 months. Dietary recommendations Physical Activity recommendations Doing well on current regimen; monitor close hypoglycemia  -     COMPLETE METABOLIC PANEL WITH GFR -     Hemoglobin A1c  Hyperlipidemia associated with type 2 diabetes mellitus ([place]) Currently out/off of statin following paxlovid - resume LDL goal <70, titrate for goal  Continue low cholesterol diet and exercise.  Check lipid panel.  -     Lipid panel -     TSH  CKD stage 2 due to type 2 diabetes mellitus (HCC) Increase fluids, avoid NSAIDS, monitor sugars, will monitor On ARB, jardiance -     COMPLETE METABOLIC PANEL WITH GFR  Gastroesophageal reflux disease, unspecified whether esophagitis present Recently controlled off of meds; lifestyle reviewed   B12 deficiency Continue supplement;   Vitamin D deficiency -     VITAMIN D 25 Hydroxy (Vit-D Deficiency, Fractures)  Obesity (BMI 30.0-34.9) Long discussion about weight loss, diet, and exercise Recommended diet heavy in fruits and veggies and low in animal meats, cheeses, and dairy products, appropriate calorie intake Discussed appropriate weight for height and initial goal (<210lb ) Follow up at next visit  Benign prostatic  hyperplasia with urinary frequency Continue meds; well controlled; check PSA annually at CPE  Testosterone deficiency Continued perceived benefits with supplementation, no SE. Check level PRN, continue zinc, no symptoms of elevated estrogen or dihydrotestosterone.   Medication management -     CBC with Differential/Platelet -     COMPLETE METABOLIC PANEL WITH GFR -     Magnesium  Lab results and further recommendations pending.  Over 30 minutes of face to face time, counseling, chart review, and critical decision making was performed  Future Appointments  Date Time Provider [place]  08/01/2021  4:00 PM Magda Bernheim, NP [facility] None     Subjective:  Mark [name] is a 66 y.o. male who presents for  3 month follow up for HTN, hyperlipidemia, T2DM, and vitamin D Def.   He had covid 19 earlier this month and had to reschedule overdue 3 month OV for diabetes.   BMI is Body mass index is 29.46 kg/m., he has been working on diet and exercise, was going to gym 45 min daily.  Wt Readings from Last 3 Encounters:  04/14/21 217 lb 9.6 oz (98.7 kg)  04/06/21 212 lb 3.2 oz (96.3 kg)  03/29/21 219 lb (99.3 kg)   His blood pressure has been controlled at home, today their BP is BP: 138/88 Stopped taking telmisartan 20 mg due to feeling fatigued when was taking this.  He does workout. He denies chest pain, shortness of breath, dizziness.   He is on cholesterol medication (rosuvastatin 5 mg daily, newly on this daily, but stopped while  he recently had covid) and denies myalgias. His cholesterol is not at goal. The cholesterol last visit was:   Lab Results  Component Value Date   CHOL 164 11/02/2020   HDL 39 (L) 11/02/2020   LDLCALC 89 11/02/2020   TRIG 301 (H) 11/02/2020   CHOLHDL 4.2 11/02/2020   He has been working on diet and exercise for T2DM with CKD II, on Metformin 500 mg daily (can't tolerate higher doses), jardiance 25 mg, newly taking glipizide 2.5 mg BID on ASA and  ARB and denies hyperglycemia, hypoglycemia , increased appetite, nausea, paresthesia of the feet, polydipsia, polyuria and visual disturbances.  Fasting much improved with glipizide from 200+ to 104 this AM, similar last few days. Last A1C in the office was:  Lab Results  Component Value Date   HGBA1C 8.6 (H) 11/02/2020   Last GFR Lab Results  Component Value Date   GFRNONAA 65 07/29/2020    Patient is on Vitamin D supplement, taking 5000 IU recently but only takes 2-3 days/week.  Lab Results  Component Value Date   VD25OH 23 (L) 11/02/2020     He has a history of testosterone deficiency and is on testosterone replacement. He is taking 400 cc q25m last was 1 week ago. He states that the testosterone helps with his energy, libido, muscle mass. Denies symptoms of elevated estrogen or dihydrotestosterone.  Lab Results  Component Value Date   TESTOSTERONE 316 11/02/2020   Also prescribed finasteride 5 mg, tamsulosin daily for BPH, reports doing well.      Medication Review:  Current Outpatient Medications (Endocrine & Metabolic):    empagliflozin (JARDIANCE) 25 MG TABS tablet, Take 1 tablet (25 mg total) by mouth daily.   glipiZIDE (GLUCOTROL) 5 MG tablet, Take 1/2-2 tab three times a day with meals for elevated blood sugars 150+.   metFORMIN (GLUCOPHAGE-XR) 500 MG 24 hr tablet, Take  2 tablets  2 x /day  with Meals for Diabetes   testosterone cypionate (DEPOTESTOSTERONE CYPIONATE) 200 MG/ML injection, Inject 2 ml into muscle every 2 weeks  Current Facility-Administered Medications (Endocrine & Metabolic):    testosterone cypionate (DEPOTESTOSTERONE CYPIONATE) injection 200 mg  Current Outpatient Medications (Cardiovascular):    rosuvastatin (CRESTOR) 10 MG tablet, Take one tablet every night for cholesterol.   telmisartan (MICARDIS) 20 MG tablet, Take 1 tablet Daily for BP (Patient not taking: Reported on 04/14/2021)   Current Outpatient Medications (Respiratory):     ipratropium (ATROVENT HFA) 17 MCG/ACT inhaler, Inhale 2 puffs into the lungs 4 (four) times daily. (Patient not taking: Reported on 04/14/2021)   promethazine-dextromethorphan (PROMETHAZINE-DM) 6.25-15 MG/5ML syrup, Take 5 mLs by mouth 4 (four) times daily as needed for cough. (Patient not taking: Reported on 04/14/2021)   Current Outpatient Medications (Analgesics):    aspirin 81 MG chewable tablet, Chew by mouth daily.   Current Outpatient Medications (Hematological):    Cyanocobalamin (VITAMIN B-12 PO), Take by mouth daily.   Current Outpatient Medications (Other):    blood glucose meter kit and supplies KIT, Dispense based on patient and insurance preference. Use up to four times daily as directed. (FOR ICD-9 250.00, 250.01).   Cholecalciferol 1000 units capsule, Take 10,000 Units by mouth daily.   finasteride (PROSCAR) 5 MG tablet, Take 1 tablet (5 mg total) by mouth daily.   glucose blood test strip, Check blood glucose before meals & bedtime time and as needed.   tamsulosin (FLOMAX) 0.4 MG CAPS capsule, TAKE 1 CAPSULE BY MOUTH AT BEDTIME FOR PROSTATE  Allergies: Allergies  Allergen Reactions   Codeine     REACTION: Anxiety    Current Problems (verified) has Hyperlipidemia associated with type 2 diabetes mellitus ([place]); Hypertension; Type 2 diabetes mellitus ([place]); GERD (gastroesophageal reflux disease); Vitamin D deficiency; Testosterone deficiency; Obesity (BMI 30.0-34.9); Medication management; Benign prostatic hyperplasia with urinary frequency; CKD stage 2 due to type 2 diabetes mellitus ([place]); B12 deficiency; History of colon polyps; and COVID-19 (03/29/2020) on their problem list.  Surgical: He  has a past surgical history that includes Vasectomy (01/2004) and Urethral dilation (2007). Family His family history includes COPD in his father; Heart disease in his father; Hyperlipidemia in his mother; Hypertension in his father and mother. Social history  He reports that he  has never smoked. He has never used smokeless tobacco. He reports that he does not drink alcohol and does not use drugs.   Review of Systems  Constitutional:  Negative for malaise/fatigue and weight loss.  HENT:  Negative for hearing loss and tinnitus.   Eyes:  Negative for blurred vision and double vision.  Respiratory:  Negative for cough, shortness of breath and wheezing.   Cardiovascular:  Negative for chest pain, palpitations, orthopnea, claudication and leg swelling.  Gastrointestinal:  Negative for abdominal pain, blood in stool, constipation, diarrhea, heartburn, melena, nausea and vomiting.  Genitourinary: Negative.   Musculoskeletal:  Negative for joint pain and myalgias.  Skin:  Negative for rash.  Neurological:  Negative for dizziness, tingling, sensory change, weakness and headaches.  Endo/Heme/Allergies:  Negative for polydipsia.  Psychiatric/Behavioral: Negative.    All other systems reviewed and are negative.   Objective:   Today's Vitals   04/14/21 1437  BP: 138/88  Pulse: 89  Resp: 16  Temp: 97.8 F (36.6 C)  SpO2: 97%  Weight: 217 lb 9.6 oz (98.7 kg)  Height: 6' 0.06" (1.83 m)    Body mass index is 29.46 kg/m.  General appearance: alert, no distress, WD/WN, male HEENT: normocephalic, sclerae anicteric, TMs pearly, nares patent, no discharge or erythema, pharynx normal Oral cavity: MMM, no lesions Neck: supple, no lymphadenopathy, no thyromegaly, no masses Heart: RRR, normal S1, S2, no murmurs Lungs: CTA bilaterally, no wheezes, rhonchi, or rales Abdomen: +bs, soft, non tender, non distended, no masses, no hepatomegaly, no splenomegaly Musculoskeletal: nontender, no swelling, no obvious deformity Extremities: no edema, no cyanosis, no clubbing Pulses: 2+ symmetric, upper and lower extremities, normal cap refill Neurological: alert, oriented x 3, CN2-12 intact, strength normal upper extremities and lower extremities, sensation normal throughout, DTRs 2+  throughout, no cerebellar signs, gait normal Psychiatric: normal affect, behavior normal, pleasant    Izora Ribas, NP   04/14/2021

## 2021-04-15 LAB — CBC WITH DIFFERENTIAL/PLATELET
Absolute Monocytes: 655 cells/uL (ref 200–950)
Basophils Absolute: 39 cells/uL (ref 0–200)
Basophils Relative: 0.5 %
Eosinophils Absolute: 156 cells/uL (ref 15–500)
Eosinophils Relative: 2 %
HCT: 45.7 % (ref 38.5–50.0)
Hemoglobin: 15.1 g/dL (ref 13.2–17.1)
Lymphs Abs: 2153 cells/uL (ref 850–3900)
MCH: 29.3 pg (ref 27.0–33.0)
MCHC: 33 g/dL (ref 32.0–36.0)
MCV: 88.7 fL (ref 80.0–100.0)
MPV: 11 fL (ref 7.5–12.5)
Monocytes Relative: 8.4 %
Neutro Abs: 4797 cells/uL (ref 1500–7800)
Neutrophils Relative %: 61.5 %
Platelets: 310 10*3/uL (ref 140–400)
RBC: 5.15 10*6/uL (ref 4.20–5.80)
RDW: 12.1 % (ref 11.0–15.0)
Total Lymphocyte: 27.6 %
WBC: 7.8 10*3/uL (ref 3.8–10.8)

## 2021-04-15 LAB — COMPLETE METABOLIC PANEL WITH GFR
AG Ratio: 1.8 (calc) (ref 1.0–2.5)
ALT: 19 U/L (ref 9–46)
AST: 17 U/L (ref 10–35)
Albumin: 4.4 g/dL (ref 3.6–5.1)
Alkaline phosphatase (APISO): 108 U/L (ref 35–144)
BUN: 12 mg/dL (ref 7–25)
CO2: 29 mmol/L (ref 20–32)
Calcium: 9.5 mg/dL (ref 8.6–10.3)
Chloride: 105 mmol/L (ref 98–110)
Creat: 1.04 mg/dL (ref 0.70–1.35)
Globulin: 2.4 g/dL (calc) (ref 1.9–3.7)
Glucose, Bld: 170 mg/dL — ABNORMAL HIGH (ref 65–99)
Potassium: 4.3 mmol/L (ref 3.5–5.3)
Sodium: 141 mmol/L (ref 135–146)
Total Bilirubin: 0.3 mg/dL (ref 0.2–1.2)
Total Protein: 6.8 g/dL (ref 6.1–8.1)
eGFR: 79 mL/min/{1.73_m2} (ref >=60)

## 2021-04-15 LAB — HEMOGLOBIN A1C
Hgb A1c MFr Bld: 9.4 % of total Hgb — ABNORMAL HIGH (ref <5.7)
Mean Plasma Glucose: 223 mg/dL
eAG (mmol/L): 12.4 mmol/L

## 2021-04-15 LAB — LIPID PANEL
Cholesterol: 230 mg/dL — ABNORMAL HIGH (ref <200)
HDL: 38 mg/dL — ABNORMAL LOW (ref >=40)
Non-HDL Cholesterol (Calc): 192 mg/dL (calc) — ABNORMAL HIGH (ref <130)
Total CHOL/HDL Ratio: 6.1 (calc) — ABNORMAL HIGH (ref <5.0)
Triglycerides: 428 mg/dL — ABNORMAL HIGH (ref <150)

## 2021-04-15 LAB — MAGNESIUM: Magnesium: 2.1 mg/dL (ref 1.5–2.5)

## 2021-04-15 LAB — TSH: TSH: 2.16 mIU/L (ref 0.40–4.50)

## 2021-05-03 ENCOUNTER — Other Ambulatory Visit: Payer: Self-pay

## 2021-05-03 ENCOUNTER — Ambulatory Visit: Payer: Medicare PPO

## 2021-05-03 NOTE — Progress Notes (Deleted)
Patient presents to the office for a Testosterone Cypionate 200 mg/ml 2 ml IM injection in upper RIGHT outer quadrant. Patient tolerated we

## 2021-05-04 ENCOUNTER — Other Ambulatory Visit: Payer: Self-pay | Admitting: Adult Health

## 2021-05-04 ENCOUNTER — Telehealth: Payer: Self-pay

## 2021-05-04 DIAGNOSIS — E349 Endocrine disorder, unspecified: Secondary | ICD-10-CM

## 2021-05-04 MED ORDER — TESTOSTERONE CYPIONATE 200 MG/ML IM SOLN: INTRAMUSCULAR | 0 refills | Status: DC

## 2021-05-04 NOTE — Progress Notes (Signed)
Future Appointments  Date Time Provider Department Center  08/01/2021  4:00 PM Revonda Humphrey, NP [facility] None    PDMP reviewed for testosterone refill request.

## 2021-05-04 NOTE — Telephone Encounter (Signed)
Refill request for Testosterone °

## 2021-05-10 ENCOUNTER — Other Ambulatory Visit: Payer: Self-pay

## 2021-05-10 ENCOUNTER — Ambulatory Visit (INDEPENDENT_AMBULATORY_CARE_PROVIDER_SITE_OTHER): Payer: Medicare PPO

## 2021-05-10 VITALS — BP 138/80 | HR 95 | Temp 97.5°F | Wt 218.0 lb

## 2021-05-10 DIAGNOSIS — E349 Endocrine disorder, unspecified: Secondary | ICD-10-CM

## 2021-05-10 MED ORDER — TESTOSTERONE CYPIONATE 200 MG/ML IM SOLN
200.0000 mg | Freq: Once | INTRAMUSCULAR | Status: AC
  Administered 2021-05-10: 200 mg via INTRAMUSCULAR

## 2021-05-10 NOTE — Progress Notes (Signed)
Patient presents to the office for a Testosterone Cypionate 200 mg/ml 2 ml IM injection in upper RIGHT outer quadrant. Patient tolerated well. 

## 2021-06-14 ENCOUNTER — Ambulatory Visit (INDEPENDENT_AMBULATORY_CARE_PROVIDER_SITE_OTHER): Payer: Medicare PPO

## 2021-06-14 ENCOUNTER — Other Ambulatory Visit: Payer: Self-pay

## 2021-06-14 VITALS — BP 152/88 | HR 88 | Temp 97.9°F | Wt 219.4 lb

## 2021-06-14 DIAGNOSIS — E349 Endocrine disorder, unspecified: Secondary | ICD-10-CM

## 2021-06-14 MED ORDER — TESTOSTERONE CYPIONATE 200 MG/ML IM SOLN
200.0000 mg | Freq: Once | INTRAMUSCULAR | Status: AC
  Administered 2021-06-14: 200 mg via INTRAMUSCULAR

## 2021-06-14 NOTE — Progress Notes (Signed)
Patient presents to the office for a Testosterone Cypionate 200 mg/ml 2 ml IM injection in upper LEFT outer quadrant. Patient tolerated well. 

## 2021-07-07 ENCOUNTER — Ambulatory Visit (INDEPENDENT_AMBULATORY_CARE_PROVIDER_SITE_OTHER): Payer: Medicare PPO

## 2021-07-07 VITALS — BP 140/80 | HR 89 | Temp 97.9°F | Resp 16 | Ht 72.5 in | Wt 226.4 lb

## 2021-07-07 DIAGNOSIS — E349 Endocrine disorder, unspecified: Secondary | ICD-10-CM

## 2021-07-07 MED ORDER — TESTOSTERONE CYPIONATE 200 MG/ML IM SOLN
200.0000 mg | Freq: Once | INTRAMUSCULAR | Status: AC
  Administered 2021-07-07: 200 mg via INTRAMUSCULAR

## 2021-07-07 NOTE — Progress Notes (Signed)
Patient presents to the office for a Testosterone Cypionate 200 mg/ml 2 ml IM injection in upper RIGHT outer quadrant. Patient tolerated well. 

## 2021-07-26 ENCOUNTER — Other Ambulatory Visit: Payer: Self-pay | Admitting: Internal Medicine

## 2021-07-26 ENCOUNTER — Encounter: Payer: Self-pay | Admitting: Adult Health

## 2021-07-26 DIAGNOSIS — E1122 Type 2 diabetes mellitus with diabetic chronic kidney disease: Secondary | ICD-10-CM

## 2021-07-26 MED ORDER — GLIPIZIDE 5 MG PO TABS: ORAL_TABLET | ORAL | 1 refills | Status: DC

## 2021-07-26 MED ORDER — GLUCOSE BLOOD VI STRP: ORAL_STRIP | 3 refills | Status: AC

## 2021-07-27 NOTE — Progress Notes (Deleted)
MEDICARE ANNUAL WELLNESS VISIT AND FOLLOW UP Assessment:   [name] was seen today for medicare wellness and follow-up.  Diagnoses and all orders for this visit: Encounter for Medicare Annual Wellness Visit Due annually; declines vaccines today    Primary hypertension Continue medication Monitor blood pressure at home; call if consistently over 130/80 Continue DASH diet.   Reminder to go to the ER if any CP, SOB, nausea, dizziness, severe HA, changes vision/speech, left arm numbness and tingling and jaw pain. -     CBC with Differential/Platelet -     COMPLETE METABOLIC PANEL WITH GFR -     Magnesium  Type 2 diabetes mellitus with stage 2 chronic kidney disease, without long-term current use of insulin [facility]) Education: Reviewed 'ABCs' of diabetes management (respective goals in parentheses):  A1C (<7), blood pressure (<130/80), and cholesterol (LDL <70) Eye Exam yearly (report requested) and Dental Exam every 6 months. Dietary recommendations Physical Activity recommendations -     COMPLETE METABOLIC PANEL WITH GFR -     Hemoglobin A1c  Hyperlipidemia associated with type 2 diabetes mellitus (HCC) Continue medications: newly taking rosuvastatin 5 mg daily LDL goal <70, titrate for goal  Continue low cholesterol diet and exercise.  Check lipid panel.  -     Lipid panel -     TSH  CKD stage 2 due to type 2 diabetes mellitus (HCC) Increase fluids, avoid NSAIDS, monitor sugars, will monitor On ARB, jardiance -     COMPLETE METABOLIC PANEL WITH GFR  Gastroesophageal reflux disease, unspecified whether esophagitis present Recently controlled off of meds; lifestyle reviewed   B12 deficiency Continue supplement;   Vitamin D deficiency -     VITAMIN D 25 Hydroxy (Vit-D Deficiency, Fractures)  Obesity (BMI 30.0-34.9) Long discussion about weight loss, diet, and exercise Recommended diet heavy in fruits and veggies and low in animal meats, cheeses, and dairy products,  appropriate calorie intake Discussed appropriate weight for height and initial goal (<210lb ) Follow up at next visit  Benign prostatic hyperplasia with urinary frequency Continue meds; well controlled; check PSA annually at CPE  Testosterone deficiency Continued perceived benefits with supplementation, no SE. Check level PRN, continue zinc, no symptoms of elevated estrogen or dihydrotestosterone.   Medication management -     CBC with Differential/Platelet -     COMPLETE METABOLIC PANEL WITH GFR -     Magnesium  History of colon polyps -     Ambulatory referral to Gastroenterology  Over 30 minutes of exam, counseling, chart review, and critical decision making was performed  Future Appointments  Date Time Provider [place]  08/01/2021  4:00 PM Magda Bernheim, NP [facility] None     Plan:   During the course of the visit the patient was educated and counseled about appropriate screening and preventive services including:   Pneumococcal vaccine  Influenza vaccine Prevnar 13 Td vaccine Screening electrocardiogram Colorectal cancer screening Diabetes screening Glaucoma screening Nutrition counseling    Subjective:  Mark [name] is a 66 y.o. male who presents for Medicare Annual Wellness Visit and 3 month follow up for HTN, hyperlipidemia, T2DM, and vitamin D Def.   BMI is There is no height or weight on file to calculate BMI., he has been working on diet and exercise, goes to gym 45 min daily.  Wt Readings from Last 3 Encounters:  07/07/21 226 lb 6.4 oz (102.7 kg)  06/14/21 219 lb 6.4 oz (99.5 kg)  05/10/21 218 lb (98.9 kg)  His blood pressure has been controlled at home, today their BP is   He does workout. He denies chest pain, shortness of breath, dizziness.   He is on cholesterol medication (rosuvastatin 5 mg daily, newly on this daily) and denies myalgias. His cholesterol is not at goal. The cholesterol last visit was:   Lab Results  Component Value  Date   CHOL 230 (H) 04/14/2021   HDL 38 (L) 04/14/2021   LDLCALC  04/14/2021     Comment:     . LDL cholesterol not calculated. Triglyceride levels greater than 400 mg/dL invalidate calculated LDL results. . Reference range: <100 . Desirable range <100 mg/dL for primary prevention;   <70 mg/dL for patients with CHD or diabetic patients  with > or = 2 CHD risk factors. Marland Kitchen LDL-C is now calculated using the Martin-Hopkins  calculation, which is a validated novel method providing  better accuracy than the Friedewald equation in the  estimation of LDL-C.  Cresenciano Genre et al. Annamaria Helling. 8299;371(69): 2061-2068  (http://education.[facility].com/faq/FAQ164)    TRIG 428 (H) 04/14/2021   CHOLHDL 6.1 (H) 04/14/2021   He has been working on diet and exercise for T2DM with CKD II, on Metformin 2000 mg daily, jardiance 25 mg, on ASA and ARB and denies hyperglycemia, hypoglycemia , increased appetite, nausea, paresthesia of the feet, polydipsia, polyuria and visual disturbances.  Reports fasting typically around 110-130.  Last A1C in the office was:  Lab Results  Component Value Date   HGBA1C 9.4 (H) 04/14/2021   Last GFR Lab Results  Component Value Date   GFRNONAA 65 07/29/2020    Patient is on Vitamin D supplement, taking 5000 IU recently.  Lab Results  Component Value Date   VD25OH 23 (L) 11/02/2020     He has a history of testosterone deficiency and is on testosterone replacement. He is taking 400 cc q77m last 1 week ago 07/22/2020. He states that the testosterone helps with his energy, libido, muscle mass. Denies symptoms of elevated estrogen or dihydrotestosterone.  Lab Results  Component Value Date   TESTOSTERONE 316 11/02/2020   Also prescribed finasteride 5 mg, tamsulosin daily for BPH, reports doing well.   Medication Review:  Current Outpatient Medications (Endocrine & Metabolic):    empagliflozin (JARDIANCE) 25 MG TABS tablet, Take 1 tablet (25 mg total) by mouth  daily.   glipiZIDE (GLUCOTROL) 5 MG tablet, Take  1/2 to 1 tablet   3 x /day  with Meals   for elevated blood sugars 150+.   metFORMIN (GLUCOPHAGE-XR) 500 MG 24 hr tablet, Take 1 tab daily with largest Meal for Diabetes.   testosterone cypionate (DEPOTESTOSTERONE CYPIONATE) 200 MG/ML injection, Inject 2 ml into muscle every 2 weeks  Current Outpatient Medications (Cardiovascular):    rosuvastatin (CRESTOR) 10 MG tablet, Take one tablet every night for cholesterol.   Current Outpatient Medications (Analgesics):    aspirin 81 MG chewable tablet, Chew by mouth daily.  Current Outpatient Medications (Hematological):    Cyanocobalamin (VITAMIN B-12 PO), Take by mouth daily.  Current Outpatient Medications (Other):    blood glucose meter kit and supplies KIT, Dispense based on patient and insurance preference. Use up to four times daily as directed. (FOR ICD-9 250.00, 250.01).   Cholecalciferol 1000 units capsule, Take 10,000 Units by mouth daily.   glucose blood test strip, Check blood glucose before meals & bedtime time and as needed.Check blood glucose before meals & bedtime time and as needed.   tamsulosin (FLOMAX) 0.4 MG CAPS capsule,  TAKE 1 CAPSULE BY MOUTH AT BEDTIME FOR PROSTATE   Allergies: Allergies  Allergen Reactions   Codeine     REACTION: Anxiety    Current Problems (verified) has Hyperlipidemia associated with type 2 diabetes mellitus ([place]); Hypertension; Type 2 diabetes mellitus ([place]); GERD (gastroesophageal reflux disease); Vitamin D deficiency; Testosterone deficiency; Obesity (BMI 30.0-34.9); Medication management; Benign prostatic hyperplasia with urinary frequency; CKD stage 2 due to type 2 diabetes mellitus ([place]); B12 deficiency; History of colon polyps; and COVID-19 (03/29/2020) on their problem list.  Screening Tests Immunization History  Administered Date(s) Administered   Td 03/27/2005   Tdap 08/09/2015    Preventative care: Last colonoscopy: 11/04/2009 Dr.  Fuller Plan, polyps  Prior vaccinations: TD or Tdap: 2017  Influenza: declines   Pneumococcal: declines Shingles/Zostavax: declines  Covid 19: defer   Names of Other Physician/Practitioners you currently use: 1. [facility] here for primary care 2. [facility], gets checked annually at DOT physical, last 2021, request report for diabetes eye  3. Dr. Jenne Campus, dentist, last visit 2021, looking for new dentist  Patient Care Team: Unk Pinto, MD as PCP - General (Internal Medicine)  Surgical: He  has a past surgical history that includes Vasectomy (01/2004) and Urethral dilation (2007). Family His family history includes COPD in his father; Heart disease in his father; Hyperlipidemia in his mother; Hypertension in his father and mother. Social history  He reports that he has never smoked. He has never used smokeless tobacco. He reports that he does not drink alcohol and does not use drugs.  MEDICARE WELLNESS OBJECTIVES: Physical activity:   Cardiac risk factors:   Depression/mood screen:      07/29/2020    4:32 PM  Depression screen PHQ 2/9  Decreased Interest 0  Down, Depressed, Hopeless 0  PHQ - 2 Score 0    ADLs:     07/29/2020    4:29 PM  In your present state of health, do you have any difficulty performing the following activities:  Hearing? 0  Vision? 0  Difficulty concentrating or making decisions? 0  Walking or climbing stairs? 0  Dressing or bathing? 0  Doing errands, shopping? 0     Cognitive Testing  Alert? Yes  Normal Appearance?Yes  Oriented to person? Yes  Place? Yes   Time? Yes  Recall of three objects?  Yes  Can perform simple calculations? Yes  Displays appropriate judgment?Yes  Can read the correct time from a watch face?Yes  EOL planning:     Objective:   There were no vitals filed for this visit.  There is no height or weight on file to calculate BMI.  General appearance: alert, no distress,  WD/WN, male HEENT: normocephalic, sclerae anicteric, TMs pearly, nares patent, no discharge or erythema, pharynx normal Oral cavity: MMM, no lesions Neck: supple, no lymphadenopathy, no thyromegaly, no masses Heart: RRR, normal S1, S2, no murmurs Lungs: CTA bilaterally, no wheezes, rhonchi, or rales Abdomen: +bs, soft, non tender, non distended, no masses, no hepatomegaly, no splenomegaly Musculoskeletal: nontender, no swelling, no obvious deformity Extremities: no edema, no cyanosis, no clubbing Pulses: 2+ symmetric, upper and lower extremities, normal cap refill Neurological: alert, oriented x 3, CN2-12 intact, strength normal upper extremities and lower extremities, sensation normal throughout, DTRs 2+ throughout, no cerebellar signs, gait normal Psychiatric: normal affect, behavior normal, pleasant   EKG: NSR AAA [place]: <3 cm, normal    Medicare Attestation I have personally reviewed: The patient's medical and social history  Their use of alcohol, tobacco or illicit drugs Their current medications and supplements The patient's functional ability including ADLs,fall risks, home safety risks, cognitive, and hearing and visual impairment Diet and physical activities Evidence for depression or mood disorders  The patient's weight, height, BMI, and visual acuity have been recorded in the chart.  I have made referrals, counseling, and provided education to the patient based on review of the above and I have provided the patient with a written personalized care plan for preventive services.     Magda Bernheim, NP   07/27/2021

## 2021-08-01 ENCOUNTER — Ambulatory Visit: Payer: Medicare PPO | Admitting: Nurse Practitioner

## 2021-08-01 DIAGNOSIS — I10 Essential (primary) hypertension: Secondary | ICD-10-CM

## 2021-08-01 DIAGNOSIS — Z0000 Encounter for general adult medical examination without abnormal findings: Secondary | ICD-10-CM

## 2021-08-01 DIAGNOSIS — E291 Testicular hypofunction: Secondary | ICD-10-CM

## 2021-08-01 DIAGNOSIS — E1169 Type 2 diabetes mellitus with other specified complication: Secondary | ICD-10-CM

## 2021-08-01 DIAGNOSIS — K219 Gastro-esophageal reflux disease without esophagitis: Secondary | ICD-10-CM

## 2021-08-01 DIAGNOSIS — E1122 Type 2 diabetes mellitus with diabetic chronic kidney disease: Secondary | ICD-10-CM

## 2021-08-01 DIAGNOSIS — Z86010 Personal history of colonic polyps: Secondary | ICD-10-CM

## 2021-08-01 DIAGNOSIS — E669 Obesity, unspecified: Secondary | ICD-10-CM

## 2021-08-01 DIAGNOSIS — E538 Deficiency of other specified B group vitamins: Secondary | ICD-10-CM

## 2021-08-01 DIAGNOSIS — E559 Vitamin D deficiency, unspecified: Secondary | ICD-10-CM

## 2021-08-01 DIAGNOSIS — Z79899 Other long term (current) drug therapy: Secondary | ICD-10-CM

## 2021-08-01 DIAGNOSIS — N138 Other obstructive and reflux uropathy: Secondary | ICD-10-CM

## 2021-08-01 NOTE — Progress Notes (Deleted)
MEDICARE ANNUAL WELLNESS VISIT AND FOLLOW UP Assessment:   Mark [name] was seen today for medicare wellness and follow-up.  Diagnoses and all orders for this visit: Encounter for Medicare Annual Wellness Visit Due annually; declines vaccines today    Primary hypertension Continue medication Monitor blood pressure at home; call if consistently over 130/80 Continue DASH diet.   Reminder to go to the ER if any CP, SOB, nausea, dizziness, severe HA, changes vision/speech, left arm numbness and tingling and jaw pain. -     CBC with Differential/Platelet -     COMPLETE METABOLIC PANEL WITH GFR -     Magnesium  Type 2 diabetes mellitus with stage 2 chronic kidney disease, without long-term current use of insulin [facility]) Education: Reviewed 'ABCs' of diabetes management (respective goals in parentheses):  A1C (<7), blood pressure (<130/80), and cholesterol (LDL <70) Eye Exam yearly (report requested) and Dental Exam every 6 months. Dietary recommendations Physical Activity recommendations -     COMPLETE METABOLIC PANEL WITH GFR -     Hemoglobin A1c  Hyperlipidemia associated with type 2 diabetes mellitus (HCC) Continue medications: newly taking rosuvastatin 5 mg daily LDL goal <70, titrate for goal  Continue low cholesterol diet and exercise.  Check lipid panel.  -     Lipid panel -     TSH  CKD stage 2 due to type 2 diabetes mellitus (HCC) Increase fluids, avoid NSAIDS, monitor sugars, will monitor On ARB, jardiance -     COMPLETE METABOLIC PANEL WITH GFR  Gastroesophageal reflux disease, unspecified whether esophagitis present Recently controlled off of meds; lifestyle reviewed   B12 deficiency Continue supplement;   Vitamin D deficiency -     VITAMIN D 25 Hydroxy (Vit-D Deficiency, Fractures)  Obesity (BMI 30.0-34.9) Long discussion about weight loss, diet, and exercise Recommended diet heavy in fruits and veggies and low in animal meats, cheeses, and dairy products,  appropriate calorie intake Discussed appropriate weight for height and initial goal (<210lb ) Follow up at next visit  Benign prostatic hyperplasia with urinary frequency Continue meds; well controlled; check PSA annually at CPE  Testosterone deficiency Continued perceived benefits with supplementation, no SE. Check level PRN, continue zinc, no symptoms of elevated estrogen or dihydrotestosterone.   Medication management -     CBC with Differential/Platelet -     COMPLETE METABOLIC PANEL WITH GFR -     Magnesium  History of colon polyps -     Ambulatory referral to Gastroenterology  Over 30 minutes of exam, counseling, chart review, and critical decision making was performed  Future Appointments  Date Time Provider [place]  08/02/2021  9:30 AM Magda Bernheim, NP [facility] None     Plan:   During the course of the visit the patient was educated and counseled about appropriate screening and preventive services including:   Pneumococcal vaccine  Influenza vaccine Prevnar 13 Td vaccine Screening electrocardiogram Colorectal cancer screening Diabetes screening Glaucoma screening Nutrition counseling    Subjective:  Mark [name] is a 66 y.o. male who presents for Medicare Annual Wellness Visit and 3 month follow up for HTN, hyperlipidemia, T2DM, and vitamin D Def.   BMI is There is no height or weight on file to calculate BMI., he has been working on diet and exercise, goes to gym 45 min daily.  Wt Readings from Last 3 Encounters:  07/07/21 226 lb 6.4 oz (102.7 kg)  06/14/21 219 lb 6.4 oz (99.5 kg)  05/10/21 218 lb (98.9 kg)  His blood pressure has been controlled at home, today their BP is   He does workout. He denies chest pain, shortness of breath, dizziness.   He is on cholesterol medication (rosuvastatin 5 mg daily, newly on this daily) and denies myalgias. His cholesterol is not at goal. The cholesterol last visit was:   Lab Results  Component Value  Date   CHOL 230 (H) 04/14/2021   HDL 38 (L) 04/14/2021   LDLCALC  04/14/2021     Comment:     . LDL cholesterol not calculated. Triglyceride levels greater than 400 mg/dL invalidate calculated LDL results. . Reference range: <100 . Desirable range <100 mg/dL for primary prevention;   <70 mg/dL for patients with CHD or diabetic patients  with > or = 2 CHD risk factors. Marland Kitchen LDL-C is now calculated using the Martin-Hopkins  calculation, which is a validated novel method providing  better accuracy than the Friedewald equation in the  estimation of LDL-C.  Cresenciano Genre et al. Annamaria Helling. 9562;130(86): 2061-2068  (http://education.[facility].com/faq/FAQ164)    TRIG 428 (H) 04/14/2021   CHOLHDL 6.1 (H) 04/14/2021   He has been working on diet and exercise for T2DM with CKD II, on Metformin 2000 mg daily, jardiance 25 mg, on ASA and ARB and denies hyperglycemia, hypoglycemia , increased appetite, nausea, paresthesia of the feet, polydipsia, polyuria and visual disturbances.  Reports fasting typically around 110-130.  Last A1C in the office was:  Lab Results  Component Value Date   HGBA1C 9.4 (H) 04/14/2021   Last GFR Lab Results  Component Value Date   GFRNONAA 65 07/29/2020    Patient is on Vitamin D supplement, taking 5000 IU recently.  Lab Results  Component Value Date   VD25OH 23 (L) 11/02/2020     He has a history of testosterone deficiency and is on testosterone replacement. He is taking 400 cc q76m last 1 week ago 07/22/2020. He states that the testosterone helps with his energy, libido, muscle mass. Denies symptoms of elevated estrogen or dihydrotestosterone.  Lab Results  Component Value Date   TESTOSTERONE 316 11/02/2020   Also prescribed finasteride 5 mg, tamsulosin daily for BPH, reports doing well.   Medication Review:  Current Outpatient Medications (Endocrine & Metabolic):    empagliflozin (JARDIANCE) 25 MG TABS tablet, Take 1 tablet (25 mg total) by mouth  daily.   glipiZIDE (GLUCOTROL) 5 MG tablet, Take  1/2 to 1 tablet   3 x /day  with Meals   for elevated blood sugars 150+.   metFORMIN (GLUCOPHAGE-XR) 500 MG 24 hr tablet, Take 1 tab daily with largest Meal for Diabetes.   testosterone cypionate (DEPOTESTOSTERONE CYPIONATE) 200 MG/ML injection, Inject 2 ml into muscle every 2 weeks  Current Outpatient Medications (Cardiovascular):    rosuvastatin (CRESTOR) 10 MG tablet, Take one tablet every night for cholesterol.   Current Outpatient Medications (Analgesics):    aspirin 81 MG chewable tablet, Chew by mouth daily.  Current Outpatient Medications (Hematological):    Cyanocobalamin (VITAMIN B-12 PO), Take by mouth daily.  Current Outpatient Medications (Other):    blood glucose meter kit and supplies KIT, Dispense based on patient and insurance preference. Use up to four times daily as directed. (FOR ICD-9 250.00, 250.01).   Cholecalciferol 1000 units capsule, Take 10,000 Units by mouth daily.   glucose blood test strip, Check blood glucose before meals & bedtime time and as needed.Check blood glucose before meals & bedtime time and as needed.   tamsulosin (FLOMAX) 0.4 MG CAPS capsule,  TAKE 1 CAPSULE BY MOUTH AT BEDTIME FOR PROSTATE   Allergies: Allergies  Allergen Reactions   Codeine     REACTION: Anxiety    Current Problems (verified) has Hyperlipidemia associated with type 2 diabetes mellitus ([place]); Hypertension; Type 2 diabetes mellitus ([place]); GERD (gastroesophageal reflux disease); Vitamin D deficiency; Testosterone deficiency; Obesity (BMI 30.0-34.9); Medication management; Benign prostatic hyperplasia with urinary frequency; CKD stage 2 due to type 2 diabetes mellitus ([place]); B12 deficiency; History of colon polyps; and COVID-19 (03/29/2020) on their problem list.  Screening Tests Immunization History  Administered Date(s) Administered   Td 03/27/2005   Tdap 08/09/2015    Preventative care: Last colonoscopy: 11/04/2009 Dr.  Fuller Plan, polyps  Prior vaccinations: TD or Tdap: 2017  Influenza: declines   Pneumococcal: declines Shingles/Zostavax: declines  Covid 19: defer   Names of Other Physician/Practitioners you currently use: 1. [facility] here for primary care 2. [facility], gets checked annually at DOT physical, last 2021, request report for diabetes eye  3. Dr. Jenne Campus, dentist, last visit 2021, looking for new dentist  Patient Care Team: Unk Pinto, MD as PCP - General (Internal Medicine)  Surgical: He  has a past surgical history that includes Vasectomy (01/2004) and Urethral dilation (2007). Family His family history includes COPD in his father; Heart disease in his father; Hyperlipidemia in his mother; Hypertension in his father and mother. Social history  He reports that he has never smoked. He has never used smokeless tobacco. He reports that he does not drink alcohol and does not use drugs.  MEDICARE WELLNESS OBJECTIVES: Physical activity:   Cardiac risk factors:   Depression/mood screen:      07/29/2020    4:32 PM  Depression screen PHQ 2/9  Decreased Interest 0  Down, Depressed, Hopeless 0  PHQ - 2 Score 0    ADLs:      View : No data to display.           Cognitive Testing  Alert? Yes  Normal Appearance?Yes  Oriented to person? Yes  Place? Yes   Time? Yes  Recall of three objects?  Yes  Can perform simple calculations? Yes  Displays appropriate judgment?Yes  Can read the correct time from a watch face?Yes  EOL planning:     Objective:   There were no vitals filed for this visit.  There is no height or weight on file to calculate BMI.  General appearance: alert, no distress, WD/WN, male HEENT: normocephalic, sclerae anicteric, TMs pearly, nares patent, no discharge or erythema, pharynx normal Oral cavity: MMM, no lesions Neck: supple, no lymphadenopathy, no thyromegaly, no masses Heart: RRR, normal S1, S2, no  murmurs Lungs: CTA bilaterally, no wheezes, rhonchi, or rales Abdomen: +bs, soft, non tender, non distended, no masses, no hepatomegaly, no splenomegaly Musculoskeletal: nontender, no swelling, no obvious deformity Extremities: no edema, no cyanosis, no clubbing Pulses: 2+ symmetric, upper and lower extremities, normal cap refill Neurological: alert, oriented x 3, CN2-12 intact, strength normal upper extremities and lower extremities, sensation normal throughout, DTRs 2+ throughout, no cerebellar signs, gait normal Psychiatric: normal affect, behavior normal, pleasant     Medicare Attestation I have personally reviewed: The patient's medical and social history Their use of alcohol, tobacco or illicit drugs Their current medications and supplements The patient's functional ability including ADLs,fall risks, home safety risks, cognitive, and hearing and visual impairment Diet and physical activities Evidence for depression or mood disorders  The patient's weight, height, BMI, and  visual acuity have been recorded in the chart.  I have made referrals, counseling, and provided education to the patient based on review of the above and I have provided the patient with a written personalized care plan for preventive services.     Magda Bernheim, NP   08/01/2021

## 2021-08-02 ENCOUNTER — Ambulatory Visit: Payer: Medicare PPO | Admitting: Nurse Practitioner

## 2021-08-03 NOTE — Progress Notes (Signed)
MEDICARE ANNUAL WELLNESS VISIT AND FOLLOW UP ?Assessment:  ? ?Mark [name] was seen today for medicare wellness and follow-up. ? ?Diagnoses and all orders for this visit: ?Encounter for Medicare Annual Wellness Visit ?Due annually; declines vaccines today  ?Refuses Colonoscopy, agrees to cologuard- ordered today ? ?Essential hypertension ?Continue medication ?Monitor blood pressure at home; call if consistently over 130/80 ?Continue DASH diet.   ?Reminder to go to the ER if any CP, SOB, nausea, dizziness, severe HA, changes vision/speech, left arm numbness and tingling and jaw pain. ?-     CBC with Differential/Platelet ?-     COMPLETE METABOLIC PANEL WITH GFR ? ? ?Type 2 diabetes mellitus with stage 2 chronic kidney disease, without long-term current use of insulin ([place]) ?Education: Reviewed ?ABCs? of diabetes management (respective goals in parentheses):  A1C (<7), blood pressure (<130/80), and cholesterol (LDL <70) ?Eye Exam yearly (report requested) and Dental Exam every 6 months. Refuses eye exam and has never had- counseled on eye changes that can occur with diabetes ?Dietary recommendations ?Physical Activity recommendations ?-     COMPLETE METABOLIC PANEL WITH GFR ?-     Hemoglobin A1c ? ?Hyperlipidemia associated with type 2 diabetes mellitus ([place]) ?Continue medications: newly taking rosuvastatin 5 mg daily ?LDL goal <70, titrate for goal  ?Continue low cholesterol diet and exercise.  ?Check lipid panel.  ?-     Lipid panel ?-     TSH ? ?CKD stage 2 due to type 2 diabetes mellitus ([place]) ?Increase fluids, avoid NSAIDS, monitor sugars, will monitor ?On ARB, glipizide 37m at bedtime, jardiance 25 mg and Metformin 500 mg 2 tabs BID ?-     COMPLETE METABOLIC PANEL WITH GFR ? ?Gastroesophageal reflux disease, unspecified whether esophagitis present ?Recently controlled off of meds; lifestyle reviewed  ? ?B12 deficiency ?Continue supplement;  ? ?Vitamin D deficiency ?Continue Vit D supplementation to maintain value in  therapeutic level of 60-100  ? ?Obesity (BMI 30.0-34.9) ?Long discussion about weight loss, diet, and exercise ?Recommended diet heavy in fruits and veggies and low in animal meats, cheeses, and dairy products, appropriate calorie intake ?Discussed appropriate weight for height and initial goal (<210lb ) ?Follow up at next visit ? ?Benign prostatic hyperplasia with urinary frequency ?Continue meds; well controlled; check PSA annually at CPE ? ?Testosterone deficiency ?Continued perceived benefits with supplementation, no SE. ?Check level PRN, continue zinc, no symptoms of elevated estrogen or dihydrotestosterone.  ? ?Medication management ?-     CBC with Differential/Platelet ?-     COMPLETE METABOLIC PANEL WITH GFR ? ? ?History of colon polyps ?Pt declines colonoscopy, agrees to do cologuard- order placed ?Pt is counseled on risks ? ?Over 30 minutes of exam, counseling, chart review, and critical decision making was performed ? ?Future Appointments  ?Date Time Provider [place] ?08/03/2022  9:00 AM Quintin Hjort, [place] NP [facility] None  ? ? ? ?Plan:  ? ?During the course of the visit the patient was educated and counseled about appropriate screening and preventive services including:  ? ?Pneumococcal vaccine  ?Influenza vaccine ?Prevnar 13 ?Td vaccine ?Screening electrocardiogram ?Colorectal cancer screening ?Diabetes screening ?Glaucoma screening ?Nutrition counseling  ? ? ?Subjective:  ?Mark SKELLEYis a 66y.o. male who presents for Medicare Annual Wellness Visit and 3 month follow up for HTN, hyperlipidemia, T2DM, and vitamin D Def.  ? ?BMI is Body mass index is 30.34 kg/m?., he has been working on diet and exercise, goes to gym 45 min daily. Encouraged water, not eating as  many fruit and vegetables ?Wt Readings from Last 3 Encounters:  ?08/04/21 226 lb 12.8 oz (102.9 kg)  ?07/07/21 226 lb 6.4 oz (102.7 kg)  ?06/14/21 219 lb 6.4 oz (99.5 kg)  ? ?His blood pressure has been controlled at home, today their  BP is BP: 136/84  ?BP Readings from Last 3 Encounters:  ?08/04/21 136/84  ?07/07/21 140/80  ?06/14/21 (!) 152/88  ? He does workout. He denies chest pain, shortness of breath, dizziness.  ? ? ?He is on cholesterol medication (rosuvastatin 10 mg daily) and denies myalgias. His cholesterol is not at goal. The cholesterol last visit was:   ?Lab Results  ?Component Value Date  ? CHOL 230 (H) 04/14/2021  ? HDL 38 (L) 04/14/2021  ? [place]  04/14/2021  ?   Comment:  ?   . ?LDL cholesterol not calculated. Triglyceride levels ?greater than 400 mg/dL invalidate calculated LDL results. ?. ?Reference range: <100 ?Marland Kitchen ?Desirable range <100 mg/dL for primary prevention;   ?<70 mg/dL for patients with CHD or diabetic patients  ?with > or = 2 CHD risk factors. ?. ?LDL-C is now calculated using the Martin-Hopkins  ?calculation, which is a validated novel method providing  ?better accuracy than the Friedewald equation in the  ?estimation of LDL-C.  ?Cresenciano Genre et al. Annamaria Helling. 8502;774(12): 2061-2068  ?(http://education.[employer].com/faq/FAQ164) ?  ? TRIG 428 (H) 04/14/2021  ? CHOLHDL 6.1 (H) 04/14/2021  ? ?He has been working on diet and exercise for T2DM with CKD II, on Metformin 2000 mg daily, glipizide 5 mg at night, jardiance 25 mg, on ASA and ARB and denies hyperglycemia, hypoglycemia , increased appetite, nausea, paresthesia of the feet, polydipsia, polyuria and visual disturbances.  ?Reports fasting typically around 120 range  ?Has never had an eye exam and refuses ?Last A1C in the office was:  ?Lab Results  ?Component Value Date  ? HGBA1C 9.4 (H) 04/14/2021  ? ?Last GFR ?Lab Results  ?Component Value Date  ? GFRNONAA 65 07/29/2020  ? ? Patient is on Vitamin D supplement, taking 5000 IU recently.  ?Lab Results  ?Component Value Date  ? VD25OH 23 (L) 11/02/2020  ?   ?He has a history of testosterone deficiency and is on testosterone replacement. He is taking 400 cc q38m last 1 week ago 07/22/2020. He states that the  testosterone helps with his energy, libido, muscle mass. Denies symptoms of elevated estrogen or dihydrotestosterone.  He feels very fatigued when his testosterone runs out. ?Lab Results  ?Component Value Date  ? TESTOSTERONE 316 11/02/2020  ? ?Also prescribed finasteride 5 mg, tamsulosin daily for BPH, reports doing well.  ? ?Medication Review: ? ?Current Outpatient Medications (Endocrine & Metabolic):  ?  empagliflozin (JARDIANCE) 25 MG TABS tablet, Take 1 tablet (25 mg total) by mouth daily. ?  metFORMIN (GLUCOPHAGE-XR) 500 MG 24 hr tablet, Take 1 tab daily with largest Meal for Diabetes. ?  testosterone cypionate (DEPOTESTOSTERONE CYPIONATE) 200 MG/ML injection, Inject 2 ml into muscle every 2 weeks ?  glipiZIDE (GLUCOTROL) 5 MG tablet, Take  1/2 to 1 tablet   3 x /day  with Meals   for elevated blood sugars 150+. ? ?Current Outpatient Medications (Cardiovascular):  ?  rosuvastatin (CRESTOR) 10 MG tablet, Take one tablet every night for cholesterol. ? ? ?Current Outpatient Medications (Analgesics):  ?  aspirin 81 MG chewable tablet, Chew by mouth daily. ? ?Current Outpatient Medications (Hematological):  ?  Cyanocobalamin (VITAMIN B-12 PO), Take by mouth daily. ? ?Current Outpatient Medications (Other):  ?  Cholecalciferol 1000 units capsule, Take 10,000 Units by mouth daily. ?  famotidine (PEPCID) 40 MG tablet, Take 1 tablet (40 mg total) by mouth every evening. ?  tamsulosin (FLOMAX) 0.4 MG CAPS capsule, TAKE 1 CAPSULE BY MOUTH AT BEDTIME FOR PROSTATE ?  blood glucose meter kit and supplies KIT, Dispense based on patient and insurance preference. Use up to four times daily as directed. (FOR ICD-9 250.00, 250.01). ?  glucose blood test strip, Check blood glucose before meals & bedtime time and as needed.Check blood glucose before meals & bedtime time and as needed. ? ? ?Allergies: ?Allergies  ?Allergen Reactions  ? Codeine   ?  REACTION: Anxiety  ? ? ?Current Problems (verified) ?has Hyperlipidemia associated  with type 2 diabetes mellitus ([place]); Hypertension; Type 2 diabetes mellitus ([place]); GERD (gastroesophageal reflux disease); Vitamin D deficiency; Testosterone deficiency; Obesity (BMI 30.0-34.9); Medication manage

## 2021-08-04 ENCOUNTER — Encounter: Payer: Self-pay | Admitting: Nurse Practitioner

## 2021-08-04 ENCOUNTER — Ambulatory Visit (INDEPENDENT_AMBULATORY_CARE_PROVIDER_SITE_OTHER): Payer: Medicare PPO | Admitting: Nurse Practitioner

## 2021-08-04 VITALS — BP 136/84 | HR 86 | Temp 97.5°F | Wt 226.8 lb

## 2021-08-04 DIAGNOSIS — R6889 Other general symptoms and signs: Secondary | ICD-10-CM | POA: Diagnosis not present

## 2021-08-04 DIAGNOSIS — E291 Testicular hypofunction: Secondary | ICD-10-CM

## 2021-08-04 DIAGNOSIS — K219 Gastro-esophageal reflux disease without esophagitis: Secondary | ICD-10-CM

## 2021-08-04 DIAGNOSIS — Z86010 Personal history of colonic polyps: Secondary | ICD-10-CM | POA: Diagnosis not present

## 2021-08-04 DIAGNOSIS — N182 Chronic kidney disease, stage 2 (mild): Secondary | ICD-10-CM | POA: Diagnosis not present

## 2021-08-04 DIAGNOSIS — E1169 Type 2 diabetes mellitus with other specified complication: Secondary | ICD-10-CM | POA: Diagnosis not present

## 2021-08-04 DIAGNOSIS — E1122 Type 2 diabetes mellitus with diabetic chronic kidney disease: Secondary | ICD-10-CM | POA: Diagnosis not present

## 2021-08-04 DIAGNOSIS — E559 Vitamin D deficiency, unspecified: Secondary | ICD-10-CM

## 2021-08-04 DIAGNOSIS — Z0000 Encounter for general adult medical examination without abnormal findings: Secondary | ICD-10-CM

## 2021-08-04 DIAGNOSIS — Z79899 Other long term (current) drug therapy: Secondary | ICD-10-CM

## 2021-08-04 DIAGNOSIS — E785 Hyperlipidemia, unspecified: Secondary | ICD-10-CM | POA: Diagnosis not present

## 2021-08-04 DIAGNOSIS — Z0001 Encounter for general adult medical examination with abnormal findings: Secondary | ICD-10-CM | POA: Diagnosis not present

## 2021-08-04 DIAGNOSIS — N401 Enlarged prostate with lower urinary tract symptoms: Secondary | ICD-10-CM

## 2021-08-04 DIAGNOSIS — I10 Essential (primary) hypertension: Secondary | ICD-10-CM

## 2021-08-04 DIAGNOSIS — E669 Obesity, unspecified: Secondary | ICD-10-CM

## 2021-08-04 DIAGNOSIS — Z1211 Encounter for screening for malignant neoplasm of colon: Secondary | ICD-10-CM

## 2021-08-04 DIAGNOSIS — E538 Deficiency of other specified B group vitamins: Secondary | ICD-10-CM

## 2021-08-04 MED ORDER — FAMOTIDINE 40 MG PO TABS: 40.0000 mg | ORAL_TABLET | Freq: Every evening | ORAL | 1 refills | Status: DC

## 2021-08-04 MED ORDER — GLIPIZIDE 5 MG PO TABS: ORAL_TABLET | ORAL | 1 refills | Status: DC

## 2021-08-04 MED ORDER — TESTOSTERONE CYPIONATE 200 MG/ML IM SOLN
200.0000 mg | Freq: Once | INTRAMUSCULAR | Status: AC
  Administered 2021-08-04: 200 mg via INTRAMUSCULAR

## 2021-08-04 NOTE — Progress Notes (Signed)
Patient presents to the office for a Testosterone Cypionate 200 mg/ml 2 ml IM injection in upper LEFT outer quadrant. Patient tolerated well. 

## 2021-08-05 LAB — COMPLETE METABOLIC PANEL WITH GFR
AG Ratio: 1.6 (calc) (ref 1.0–2.5)
ALT: 24 U/L (ref 9–46)
AST: 19 U/L (ref 10–35)
Albumin: 4.4 g/dL (ref 3.6–5.1)
Alkaline phosphatase (APISO): 119 U/L (ref 35–144)
BUN: 18 mg/dL (ref 7–25)
CO2: 28 mmol/L (ref 20–32)
Calcium: 9.1 mg/dL (ref 8.6–10.3)
Chloride: 100 mmol/L (ref 98–110)
Creat: 1.02 mg/dL (ref 0.70–1.35)
Globulin: 2.8 g/dL (calc) (ref 1.9–3.7)
Glucose, Bld: 252 mg/dL — ABNORMAL HIGH (ref 65–99)
Potassium: 4.4 mmol/L (ref 3.5–5.3)
Sodium: 136 mmol/L (ref 135–146)
Total Bilirubin: 0.3 mg/dL (ref 0.2–1.2)
Total Protein: 7.2 g/dL (ref 6.1–8.1)
eGFR: 81 mL/min/{1.73_m2} (ref >=60)

## 2021-08-05 LAB — CBC WITH DIFFERENTIAL/PLATELET
Absolute Monocytes: 628 cells/uL (ref 200–950)
Basophils Absolute: 28 cells/uL (ref 0–200)
Basophils Relative: 0.4 %
Eosinophils Absolute: 221 cells/uL (ref 15–500)
Eosinophils Relative: 3.2 %
HCT: 46.2 % (ref 38.5–50.0)
Hemoglobin: 15.3 g/dL (ref 13.2–17.1)
Lymphs Abs: 1808 cells/uL (ref 850–3900)
MCH: 29.3 pg (ref 27.0–33.0)
MCHC: 33.1 g/dL (ref 32.0–36.0)
MCV: 88.5 fL (ref 80.0–100.0)
MPV: 11.3 fL (ref 7.5–12.5)
Monocytes Relative: 9.1 %
Neutro Abs: 4216 cells/uL (ref 1500–7800)
Neutrophils Relative %: 61.1 %
Platelets: 218 10*3/uL (ref 140–400)
RBC: 5.22 10*6/uL (ref 4.20–5.80)
RDW: 12.3 % (ref 11.0–15.0)
Total Lymphocyte: 26.2 %
WBC: 6.9 10*3/uL (ref 3.8–10.8)

## 2021-08-05 LAB — LIPID PANEL
Cholesterol: 205 mg/dL — ABNORMAL HIGH (ref <200)
HDL: 43 mg/dL (ref >=40)
LDL Cholesterol (Calc): 107 mg/dL (calc) — ABNORMAL HIGH
Non-HDL Cholesterol (Calc): 162 mg/dL (calc) — ABNORMAL HIGH (ref <130)
Total CHOL/HDL Ratio: 4.8 (calc) (ref <5.0)
Triglycerides: 384 mg/dL — ABNORMAL HIGH (ref <150)

## 2021-08-05 LAB — HEMOGLOBIN A1C
Hgb A1c MFr Bld: 8.1 % of total Hgb — ABNORMAL HIGH (ref <5.7)
Mean Plasma Glucose: 186 mg/dL
eAG (mmol/L): 10.3 mmol/L

## 2021-08-05 LAB — TSH: TSH: 2.02 mIU/L (ref 0.40–4.50)

## 2021-08-24 ENCOUNTER — Other Ambulatory Visit: Payer: Self-pay | Admitting: Adult Health

## 2021-08-24 ENCOUNTER — Other Ambulatory Visit: Payer: Self-pay | Admitting: Adult Health Nurse Practitioner

## 2021-08-24 DIAGNOSIS — E1169 Type 2 diabetes mellitus with other specified complication: Secondary | ICD-10-CM

## 2021-08-24 DIAGNOSIS — N401 Enlarged prostate with lower urinary tract symptoms: Secondary | ICD-10-CM

## 2021-08-31 ENCOUNTER — Telehealth: Payer: Self-pay

## 2021-08-31 ENCOUNTER — Ambulatory Visit (INDEPENDENT_AMBULATORY_CARE_PROVIDER_SITE_OTHER): Payer: Medicare PPO

## 2021-08-31 ENCOUNTER — Other Ambulatory Visit: Payer: Self-pay | Admitting: Adult Health

## 2021-08-31 VITALS — BP 138/82 | HR 63 | Temp 97.9°F | Wt 224.6 lb

## 2021-08-31 DIAGNOSIS — E291 Testicular hypofunction: Secondary | ICD-10-CM

## 2021-08-31 DIAGNOSIS — E349 Endocrine disorder, unspecified: Secondary | ICD-10-CM

## 2021-08-31 MED ORDER — TESTOSTERONE CYPIONATE 200 MG/ML IM SOLN: INTRAMUSCULAR | 0 refills | Status: DC

## 2021-08-31 MED ORDER — TESTOSTERONE CYPIONATE 200 MG/ML IM SOLN
200.0000 mg | Freq: Once | INTRAMUSCULAR | Status: AC
  Administered 2021-08-31: 200 mg via INTRAMUSCULAR

## 2021-08-31 NOTE — Progress Notes (Signed)
Future Appointments  Date Time Provider Department Center  11/09/2021  2:30 PM Raynelle Dick, NP [facility] None  08/03/2022  9:00 AM Raynelle Dick, NP [facility] None    PDMP reviewed for adderall refill request.

## 2021-08-31 NOTE — Progress Notes (Signed)
Patient presents to the office for a Testosterone Cypionate 200 mg/ml 2 ml IM injection in upper RIGHT outer quadrant. Patient tolerated well. 

## 2021-08-31 NOTE — Telephone Encounter (Signed)
Refill needed for Testosterone

## 2021-10-03 ENCOUNTER — Ambulatory Visit (INDEPENDENT_AMBULATORY_CARE_PROVIDER_SITE_OTHER): Payer: Medicare PPO

## 2021-10-03 VITALS — BP 160/80 | HR 76 | Temp 98.0°F | Resp 17 | Ht 72.6 in | Wt 226.4 lb

## 2021-10-03 DIAGNOSIS — E349 Endocrine disorder, unspecified: Secondary | ICD-10-CM

## 2021-10-03 MED ORDER — TESTOSTERONE CYPIONATE 200 MG/ML IM SOLN
200.0000 mg | Freq: Once | INTRAMUSCULAR | Status: AC
  Administered 2021-10-03: 200 mg via INTRAMUSCULAR

## 2021-10-03 NOTE — Progress Notes (Signed)
Patient presents to the office for a Testosterone Cypionate 200 mg/ml 2 ml IM injection in upper LEFT outer quadrant. Patient tolerated well. 

## 2021-11-01 ENCOUNTER — Ambulatory Visit (INDEPENDENT_AMBULATORY_CARE_PROVIDER_SITE_OTHER): Payer: Medicare PPO

## 2021-11-01 VITALS — BP 130/68 | HR 84 | Temp 97.9°F | Wt 228.0 lb

## 2021-11-01 DIAGNOSIS — E349 Endocrine disorder, unspecified: Secondary | ICD-10-CM

## 2021-11-01 MED ORDER — TESTOSTERONE CYPIONATE 200 MG/ML IM SOLN: 200.0000 mg | Freq: Once | INTRAMUSCULAR | Status: DC

## 2021-11-01 MED ORDER — TESTOSTERONE CYPIONATE 200 MG/ML IM SOLN
200.0000 mg | Freq: Once | INTRAMUSCULAR | Status: AC
  Administered 2021-11-01: 200 mg via INTRAMUSCULAR

## 2021-11-01 NOTE — Progress Notes (Signed)
Patient did not bring medication with him today.

## 2021-11-01 NOTE — Progress Notes (Signed)
Patient presents to the office for a Testosterone Cypionate 200 mg/ml 2 ml IM injection in upper RIGHT outer quadrant. Patient tolerated well.

## 2021-11-01 NOTE — Progress Notes (Deleted)
Patient presents to the office for a Testosterone Cypionate 200 mg/ml 2 ml IM injection in upper RIGHT outer quadrant. Patient tolerated well. 

## 2021-11-09 ENCOUNTER — Ambulatory Visit: Payer: Medicare PPO | Admitting: Nurse Practitioner

## 2021-11-09 NOTE — Progress Notes (Deleted)
3 MONTH FOLLOW UP  Assessment:     Primary hypertension Off of telmisartan, improved with jardiance Monitor blood pressure at home; call if consistently over 130/80 Continue DASH diet.   Reminder to go to the ER if any CP, SOB, nausea, dizziness, severe HA, changes vision/speech, left arm numbness and tingling and jaw pain. -     CBC with Differential/Platelet -     COMPLETE METABOLIC PANEL WITH GFR -     Magnesium  Type 2 diabetes mellitus with stage 2 chronic kidney disease, without long-term current use of insulin [facility]) Education: Reviewed 'ABCs' of diabetes management (respective goals in parentheses):  A1C (<7), blood pressure (<130/80), and cholesterol (LDL <70) Eye Exam yearly (report requested) and Dental Exam every 6 months. Dietary recommendations Physical Activity recommendations Doing well on current regimen; monitor close hypoglycemia  -     COMPLETE METABOLIC PANEL WITH GFR -     Hemoglobin A1c  Hyperlipidemia associated with type 2 diabetes mellitus ([place]) Currently out/off of statin following paxlovid - resume LDL goal <70, titrate for goal  Continue low cholesterol diet and exercise.  Check lipid panel.  -     Lipid panel -     TSH  CKD stage 2 due to type 2 diabetes mellitus ([facility]) Increase fluids, avoid NSAIDS, monitor sugars, will monitor On ARB, jardiance -     COMPLETE METABOLIC PANEL WITH GFR  Gastroesophageal reflux disease, unspecified whether esophagitis present Recently controlled off of meds; lifestyle reviewed   B12 deficiency Continue supplement;   Vitamin D deficiency -     VITAMIN D 25 Hydroxy (Vit-D Deficiency, Fractures)  Obesity (BMI 30.0-34.9) Long discussion about weight loss, diet, and exercise Recommended diet heavy in fruits and veggies and low in animal meats, cheeses, and dairy products, appropriate calorie intake Discussed appropriate weight for height and initial goal (<210lb ) Follow up at next visit  Benign prostatic  hyperplasia with urinary frequency Continue meds; well controlled; check PSA annually at CPE  Testosterone deficiency Continued perceived benefits with supplementation, no SE. Check level PRN, continue zinc, no symptoms of elevated estrogen or dihydrotestosterone.   Medication management -     CBC with Differential/Platelet -     COMPLETE METABOLIC PANEL WITH GFR -     Magnesium  Lab results and further recommendations pending.  Over 30 minutes of face to face time, counseling, chart review, and critical decision making was performed  Future Appointments  Date Time Provider [place]  11/09/2021  2:30 PM Alycia Rossetti, NP [facility] None  08/03/2022  9:00 AM Alycia Rossetti, NP [facility] None     Subjective:  Mark [name] is a 66 y.o. male who presents for  3 month follow up for HTN, hyperlipidemia, T2DM, and vitamin D Def.   He had covid 19 earlier this month and had to reschedule overdue 3 month OV for diabetes.   BMI is There is no height or weight on file to calculate BMI., he has been working on diet and exercise, was going to gym 45 min daily.  Wt Readings from Last 3 Encounters:  11/01/21 228 lb (103.4 kg)  10/03/21 226 lb 6.4 oz (102.7 kg)  08/31/21 224 lb 9.6 oz (101.9 kg)   His blood pressure has been controlled at home, today their BP is   Stopped taking telmisartan 20 mg due to feeling fatigued when was taking this.  He does workout. He denies chest pain, shortness of breath, dizziness.  He is on cholesterol medication (rosuvastatin 5 mg daily, newly on this daily, but stopped while he recently had covid) and denies myalgias. His cholesterol is not at goal. The cholesterol last visit was:   Lab Results  Component Value Date   CHOL 205 (H) 08/04/2021   HDL 43 08/04/2021   LDLCALC 107 (H) 08/04/2021   TRIG 384 (H) 08/04/2021   CHOLHDL 4.8 08/04/2021   He has been working on diet and exercise for T2DM with CKD II, on Metformin 500 mg daily (can't  tolerate higher doses), jardiance 25 mg, newly taking glipizide 2.5 mg BID on ASA and ARB and denies hyperglycemia, hypoglycemia , increased appetite, nausea, paresthesia of the feet, polydipsia, polyuria and visual disturbances.  Fasting much improved with glipizide from 200+ to 104 this AM, similar last few days. Last A1C in the office was:  Lab Results  Component Value Date   HGBA1C 8.1 (H) 08/04/2021   Last GFR Lab Results  Component Value Date   GFRNONAA 65 07/29/2020    Patient is on Vitamin D supplement, taking 5000 IU recently but only takes 2-3 days/week.  Lab Results  Component Value Date   VD25OH 23 (L) 11/02/2020     He has a history of testosterone deficiency and is on testosterone replacement. He is taking 400 cc q77m last was 1 week ago. He states that the testosterone helps with his energy, libido, muscle mass. Denies symptoms of elevated estrogen or dihydrotestosterone.  Lab Results  Component Value Date   TESTOSTERONE 316 11/02/2020   Also prescribed finasteride 5 mg, tamsulosin daily for BPH, reports doing well.      Medication Review:  Current Outpatient Medications (Endocrine & Metabolic):    empagliflozin (JARDIANCE) 25 MG TABS tablet, Take 1 tablet (25 mg total) by mouth daily.   glipiZIDE (GLUCOTROL) 5 MG tablet, Take  1/2 to 1 tablet   3 x /day  with Meals   for elevated blood sugars 150+.   metFORMIN (GLUCOPHAGE-XR) 500 MG 24 hr tablet, Take 1 tab daily with largest Meal for Diabetes.   testosterone cypionate (DEPOTESTOSTERONE CYPIONATE) 200 MG/ML injection, Inject 2 ml into muscle every 2 weeks  Current Outpatient Medications (Cardiovascular):    rosuvastatin (CRESTOR) 10 MG tablet, Take one tablet every night for cholesterol.   rosuvastatin (CRESTOR) 10 MG tablet, Take  1 tablet  Daily for Cholesterol                                           /                     TAKE                  BY                    MOUTH                            ONCE  DAILY   Current Outpatient Medications (Analgesics):    aspirin 81 MG chewable tablet, Chew by mouth daily.  Current Outpatient Medications (Hematological):    Cyanocobalamin (VITAMIN B-12 PO), Take by mouth daily.  Current Outpatient Medications (Other):    blood glucose meter kit and supplies KIT, Dispense based on patient and insurance preference. Use up to four  times daily as directed. (FOR ICD-9 250.00, 250.01).   Cholecalciferol 1000 units capsule, Take 10,000 Units by mouth daily.   famotidine (PEPCID) 40 MG tablet, Take 1 tablet (40 mg total) by mouth every evening.   finasteride (PROSCAR) 5 MG tablet, Take  1 tablet  Daily  for Prostate                                       /                                    Take                      by                     mouth                                    once daily   glucose blood test strip, Check blood glucose before meals & bedtime time and as needed.Check blood glucose before meals & bedtime time and as needed.   Allergies: Allergies  Allergen Reactions   Codeine     REACTION: Anxiety    Current Problems (verified) has Hyperlipidemia associated with type 2 diabetes mellitus ([place]); Hypertension; Type 2 diabetes mellitus ([place]); GERD (gastroesophageal reflux disease); Vitamin D deficiency; Testosterone deficiency; Obesity (BMI 30.0-34.9); Medication management; Benign prostatic hyperplasia with urinary frequency; CKD stage 2 due to type 2 diabetes mellitus ([place]); B12 deficiency; History of colon polyps; and COVID-19 (03/29/2020) on their problem list.  Surgical: He  has a past surgical history that includes Vasectomy (01/2004) and Urethral dilation (2007). Family His family history includes COPD in his father; Heart disease in his father; Hyperlipidemia in his mother; Hypertension in his father and mother. Social history  He reports that he has never smoked. He has never used smokeless tobacco. He reports that he does not drink  alcohol and does not use drugs.   Review of Systems  Constitutional:  Negative for malaise/fatigue and weight loss.  HENT:  Negative for hearing loss and tinnitus.   Eyes:  Negative for blurred vision and double vision.  Respiratory:  Negative for cough, shortness of breath and wheezing.   Cardiovascular:  Negative for chest pain, palpitations, orthopnea, claudication and leg swelling.  Gastrointestinal:  Negative for abdominal pain, blood in stool, constipation, diarrhea, heartburn, melena, nausea and vomiting.  Genitourinary: Negative.   Musculoskeletal:  Negative for joint pain and myalgias.  Skin:  Negative for rash.  Neurological:  Negative for dizziness, tingling, sensory change, weakness and headaches.  Endo/Heme/Allergies:  Negative for polydipsia.  Psychiatric/Behavioral: Negative.    All other systems reviewed and are negative.    Objective:   There were no vitals filed for this visit.   There is no height or weight on file to calculate BMI.  General appearance: alert, no distress, WD/WN, male HEENT: normocephalic, sclerae anicteric, TMs pearly, nares patent, no discharge or erythema, pharynx normal Oral cavity: MMM, no lesions Neck: supple, no lymphadenopathy, no thyromegaly, no masses Heart: RRR, normal S1, S2, no murmurs Lungs: CTA bilaterally, no wheezes, rhonchi, or rales Abdomen: +bs, soft, non tender, non distended, no masses, no hepatomegaly,  no splenomegaly Musculoskeletal: nontender, no swelling, no obvious deformity Extremities: no edema, no cyanosis, no clubbing Pulses: 2+ symmetric, upper and lower extremities, normal cap refill Neurological: alert, oriented x 3, CN2-12 intact, strength normal upper extremities and lower extremities, sensation normal throughout, DTRs 2+ throughout, no cerebellar signs, gait normal Psychiatric: normal affect, behavior normal, pleasant    Alycia Rossetti, NP   11/09/2021

## 2021-11-14 ENCOUNTER — Other Ambulatory Visit: Payer: Self-pay

## 2021-11-14 DIAGNOSIS — N401 Enlarged prostate with lower urinary tract symptoms: Secondary | ICD-10-CM

## 2021-11-14 MED ORDER — TAMSULOSIN HCL 0.4 MG PO CAPS: ORAL_CAPSULE | ORAL | 0 refills | Status: DC

## 2021-11-24 ENCOUNTER — Ambulatory Visit: Payer: Medicare PPO | Admitting: Nurse Practitioner

## 2021-12-13 ENCOUNTER — Ambulatory Visit: Payer: Medicare PPO

## 2021-12-14 ENCOUNTER — Ambulatory Visit: Payer: Medicare PPO | Admitting: Nurse Practitioner

## 2021-12-14 ENCOUNTER — Ambulatory Visit (INDEPENDENT_AMBULATORY_CARE_PROVIDER_SITE_OTHER): Payer: Medicare PPO

## 2021-12-14 VITALS — BP 134/80 | HR 70 | Temp 97.7°F | Ht 72.5 in | Wt 226.0 lb

## 2021-12-14 DIAGNOSIS — E349 Endocrine disorder, unspecified: Secondary | ICD-10-CM

## 2021-12-14 MED ORDER — TESTOSTERONE CYPIONATE 200 MG/ML IM SOLN
200.0000 mg | Freq: Once | INTRAMUSCULAR | Status: AC
  Administered 2021-12-14: 200 mg via INTRAMUSCULAR

## 2021-12-14 NOTE — Progress Notes (Signed)
Patient presents to the office for a Testosterone Cypionate 200 mg/ml 2 ml IM injection in upper LEFT outer quadrant. Patient tolerated well.

## 2021-12-15 ENCOUNTER — Ambulatory Visit: Payer: Medicare PPO | Admitting: Nurse Practitioner

## 2021-12-23 ENCOUNTER — Encounter: Payer: Self-pay | Admitting: Nurse Practitioner

## 2021-12-23 ENCOUNTER — Other Ambulatory Visit: Payer: Self-pay | Admitting: Nurse Practitioner

## 2021-12-23 DIAGNOSIS — N3090 Cystitis, unspecified without hematuria: Secondary | ICD-10-CM

## 2021-12-23 MED ORDER — CIPROFLOXACIN HCL 500 MG PO TABS: 500.0000 mg | ORAL_TABLET | Freq: Two times a day (BID) | ORAL | 0 refills | Status: AC

## 2022-01-11 ENCOUNTER — Ambulatory Visit (INDEPENDENT_AMBULATORY_CARE_PROVIDER_SITE_OTHER): Payer: Medicare Other | Admitting: Nurse Practitioner

## 2022-01-11 ENCOUNTER — Encounter: Payer: Self-pay | Admitting: Nurse Practitioner

## 2022-01-11 VITALS — BP 140/88 | HR 69 | Temp 97.5°F | Ht 72.5 in | Wt 228.0 lb

## 2022-01-11 DIAGNOSIS — N182 Chronic kidney disease, stage 2 (mild): Secondary | ICD-10-CM

## 2022-01-11 DIAGNOSIS — Z79899 Other long term (current) drug therapy: Secondary | ICD-10-CM

## 2022-01-11 DIAGNOSIS — Z111 Encounter for screening for respiratory tuberculosis: Secondary | ICD-10-CM

## 2022-01-11 DIAGNOSIS — E785 Hyperlipidemia, unspecified: Secondary | ICD-10-CM | POA: Diagnosis not present

## 2022-01-11 DIAGNOSIS — E349 Endocrine disorder, unspecified: Secondary | ICD-10-CM

## 2022-01-11 DIAGNOSIS — E1169 Type 2 diabetes mellitus with other specified complication: Secondary | ICD-10-CM | POA: Diagnosis not present

## 2022-01-11 DIAGNOSIS — I10 Essential (primary) hypertension: Secondary | ICD-10-CM | POA: Diagnosis not present

## 2022-01-11 DIAGNOSIS — E1122 Type 2 diabetes mellitus with diabetic chronic kidney disease: Secondary | ICD-10-CM | POA: Diagnosis not present

## 2022-01-11 DIAGNOSIS — K219 Gastro-esophageal reflux disease without esophagitis: Secondary | ICD-10-CM | POA: Diagnosis not present

## 2022-01-11 DIAGNOSIS — E669 Obesity, unspecified: Secondary | ICD-10-CM

## 2022-01-11 MED ORDER — TESTOSTERONE CYPIONATE 200 MG/ML IM SOLN
200.0000 mg | Freq: Once | INTRAMUSCULAR | Status: AC
  Administered 2022-01-11: 200 mg via INTRAMUSCULAR

## 2022-01-11 MED ORDER — RYBELSUS 3 MG PO TABS: 3.0000 mg | ORAL_TABLET | Freq: Every day | ORAL | 0 refills | Status: DC

## 2022-01-11 NOTE — Patient Instructions (Signed)
Semaglutide Tablets What is this medication? SEMAGLUTIDE (SEM a GLOO tide) treats type 2 diabetes. It works by increasing insulin levels in your body, which decreases your blood sugar (glucose). It also reduces the amount of sugar released into the blood and slows down your digestion. Changes to diet and exercise are often combined with this medication. This medicine may be used for other purposes; ask your health care provider or pharmacist if you have questions. COMMON BRAND NAME(S): Rybelsus What should I tell my care team before I take this medication? They need to know if you have any of these conditions: Endocrine tumors (MEN 2) or if someone in your family had these tumors Eye disease History of pancreatitis Kidney disease Stomach or intestine problems Thyroid cancer or if someone in your family had thyroid cancer Vision problems An unusual or allergic reaction to semaglutide, other medications, foods, dyes, or preservatives Pregnant or trying to get pregnant Breast-feeding How should I use this medication? Take this medication by mouth. Take it as directed on the prescription label at the same time every day. Take the dose right after waking up. Do not eat or drink anything before taking it. Do not take it with any other drink except a glass of plain water that is less than 4 ounces (less than 120 mL). Do not cut, crush or chew this medication. Swallow the tablets whole. After taking it, do not eat breakfast, drink, or take any other medications or vitamins for at least 30 minutes. Keep taking it unless your care team tells you to stop. A special MedGuide will be given to you by the pharmacist with each prescription and refill. Be sure to read this information carefully each time. Talk to your care team about the use of this medication in children. Special care may be needed. Overdosage: If you think you have taken too much of this medicine contact a poison control center or emergency  room at once. NOTE: This medicine is only for you. Do not share this medicine with others. What if I miss a dose? If you miss a dose, skip it. Take your next dose at the normal time. Do not take extra or 2 doses at the same time to make up for the missed dose. What may interact with this medication? What may interact with this medication? Aminophylline Carbamazepine Cyclosporine Digoxin Levothyroxine Other medications for diabetes Phenytoin Tacrolimus Theophylline Warfarin Many medications may cause changes in blood sugar, these include: Alcohol containing beverages Antiviral medications for HIV or AIDS Aspirin and aspirin-like medications Certain medications for blood pressure, heart disease, irregular heart beat Chromium Diuretics Male hormones, such as estrogens or progestins, birth control pills Fenofibrate Gemfibrozil Isoniazid Lanreotide Male hormones or anabolic steroids MAOIs like Carbex, Eldepryl, Marplan, Nardil, and Parnate Medications for weight loss Medications for allergies, asthma, cold, or cough Medications for depression, anxiety, or psychotic disturbances Niacin Nicotine NSAIDs, medications for pain and inflammation, like ibuprofen or naproxen Octreotide Pasireotide Pentamidine Phenytoin Probenecid Quinolone antibiotics such as ciprofloxacin, levofloxacin, ofloxacin Some herbal dietary supplements Steroid medications such as prednisone or cortisone Sulfamethoxazole; trimethoprim Thyroid hormones Some medications can hide the warning symptoms of low blood sugar (hypoglycemia). You may need to monitor your blood sugar more closely if you are taking one of these medications. These include: Beta-blockers, often used for high blood pressure or heart problems (examples include atenolol, metoprolol, propranolol) Clonidine Guanethidine Reserpine This list may not describe all possible interactions. Give your health care provider a list of all the    medicines, herbs, non-prescription drugs, or dietary supplements you use. Also tell them if you smoke, drink alcohol, or use illegal drugs. Some items may interact with your medicine. What should I watch for while using this medication? Visit your care team for regular checks on your progress. Check with your care team if you have severe diarrhea, nausea, and vomiting, or if you sweat a lot. The loss of too much body fluid may make it dangerous for you to take this medication. A test called the HbA1C (A1C) will be monitored. This is a simple blood test. It measures your blood sugar control over the last 2 to 3 months. You will receive this test every 3 to 6 months. Learn how to check your blood sugar. Learn the symptoms of low and high blood sugar and how to manage them. Always carry a quick-source of sugar with you in case you have symptoms of low blood sugar. Examples include hard sugar candy or glucose tablets. Make sure others know that you can choke if you eat or drink when you develop serious symptoms of low blood sugar, such as seizures or unconsciousness. Get medical help at once. Tell your care team if you have high blood sugar. You might need to change the dose of your medication. If you are sick or exercising more than usual, you might need to change the dose of your medication. Do not skip meals. Ask your care team if you should avoid alcohol. Many nonprescription cough and cold products contain sugar or alcohol. These can affect blood sugar. Wear a medical ID bracelet or chain. Carry a card that describes your condition. List the medications and doses you take on the card. Do not become pregnant while taking this medication. Women should inform their care team if they wish to become pregnant or think they might be pregnant. There is a potential for serious side effects to an unborn child. Talk to your care team for more information. Do not breast-feed an infant while taking this  medication. What side effects may I notice from receiving this medication? Side effects that you should report to your care team as soon as possible: Allergic reactions--skin rash, itching, hives, swelling of the face, lips, tongue, or throat Change in vision Dehydration--increased thirst, dry mouth, feeling faint or lightheaded, headache, dark yellow or brown urine Gallbladder problems--severe stomach pain, nausea, vomiting, fever Heart palpitations--rapid, pounding, or irregular heartbeat Kidney injury--decrease in the amount of urine, swelling of the ankles, hands, or feet Pancreatitis--severe stomach pain that spreads to your back or gets worse after eating or when touched, fever, nausea, vomiting Thyroid cancer--new mass or lump in the neck, pain or trouble swallowing, trouble breathing, hoarseness Side effects that usually do not require medical attention (report to your care team if they continue or are bothersome): Diarrhea Loss of appetite Nausea Stomach pain Vomiting This list may not describe all possible side effects. Call your doctor for medical advice about side effects. You may report side effects to FDA at 1-800-FDA-1088. Where should I keep my medication? Keep out of the reach of children and pets. Store at room temperature between 20 and 25 degrees C (68 and 77 degrees F). Keep this medication in the original container. Protect from moisture. Keep the container tightly closed. Get rid of any unused medication after the expiration date. To get rid of medications that are no longer needed or have expired: Take the medication to a medication take-back program. Check with your pharmacy or law enforcement to   find a location. If you cannot return the medication, check the label or package insert to see if the medication should be thrown out in the garbage or flushed down the toilet. If you are not sure, ask your care team. If it is safe to put it in the trash, take the medication  out of the container. Mix the medication with cat litter, dirt, coffee grounds, or other unwanted substance. Seal the mixture in a bag or container. Put it in the trash. NOTE: This sheet is a summary. It may not cover all possible information. If you have questions about this medicine, talk to your doctor, pharmacist, or health care provider.  2023 Elsevier/Gold Standard (2020-07-21 00:00:00)  

## 2022-01-11 NOTE — Progress Notes (Signed)
Patient presents to the office for a Testosterone Cypionate 200 mg/ml 2 ml IM injection in RIGHT upper outer quadrant. Patient tolerated well  

## 2022-01-11 NOTE — Progress Notes (Signed)
FOLLOW UP Assessment:   [name] was seen today for medicare wellness and follow-up.  Diagnoses and all orders for this visit:  Essential hypertension Discussed restarting Olemesartan if BP remains >130/80. Discussed DASH (Dietary Approaches to Stop Hypertension) DASH diet is lower in sodium than a typical American diet. Cut back on foods that are high in saturated fat, cholesterol, and trans fats. Eat more whole-grain foods, fish, poultry, and nuts Remain active and exercise as tolerated daily.  Monitor BP at home-Call if greater than 130/80.  Check CMP/CBC  Type 2 diabetes mellitus with stage 2 chronic kidney disease, without long-term current use of insulin ([place]) Stop Jardiance. Start Rybelsus - sample provided.  Discussed Mounjaro if A1c does not respond to oral mediation  Education: Reviewed 'ABCs' of diabetes management  Discussed goals to be met and/or maintained include A1C (<7) Blood pressure (<130/80) Cholesterol (LDL <70) Continue Eye Exam yearly  Continue Dental Exam Q6 mo Discussed dietary recommendations Discussed Physical Activity recommendations Foot exam UTD Check A1C  Hyperlipidemia associated with type 2 diabetes mellitus ([place]) Discussed lifestyle modifications. Recommended diet heavy in fruits and veggies, omega 3's. Decrease consumption of animal meats, cheeses, and dairy products. Remain active and exercise as tolerated. Continue to monitor. Check lipids/TSH   CKD stage 2 due to type 2 diabetes mellitus ([place]) Discussed how what you eat and drink can aide in kidney protection. Stay well hydrated. Avoid high salt foods. Avoid NSAIDS. Keep BP and BG well controlled.   Take medications as prescribed. Remain active and exercise as tolerated daily. Maintain weight.  Continue to monitor. Check CMP/GFR/Microablumin   Gastroesophageal reflux disease, unspecified whether esophagitis present No suspected reflux complications  ([place]/stricture). Lifestyle modification:  wt loss, avoid meals 2-3h before bedtime. Consider eliminating food triggers:  chocolate, caffeine, EtOH, acid/spicy food.   Obesity (BMI 30.0-34.9) Discussed appropriate BMI Diet modification. Physical activity. Encouraged/praised to build confidence.   Testosterone deficiency Injection administered today. Continued perceived benefits with supplementation, no SE. Check level PRN, continue zinc, no symptoms of elevated estrogen or dihydrotestosterone.   Medication management All medications discussed and reviewed in full. All questions and concerns regarding medications addressed.    Screening for tuberculosis Quanitferon Gold   Orders Placed This Encounter  Procedures   CBC with Differential/Platelet   COMPLETE METABOLIC PANEL WITH GFR   Lipid panel   Hemoglobin A1c   Insulin, random   QuantiFERON-TB Gold Plus   Meds ordered this encounter  Medications   testosterone cypionate (DEPOTESTOSTERONE CYPIONATE) injection 200 mg   Semaglutide (RYBELSUS) 3 MG TABS    Sig: Take 3 mg by mouth daily.    Dispense:  30 tablet    Refill:  0    Order Specific Question:   Supervising Provider    Answer:   Unk Pinto [[id number]]     Over 20 minutes of exam, counseling, chart review, and critical decision making was performed  Future Appointments  Date Time Provider [place]  08/03/2022  9:00 AM Alycia Rossetti, NP [facility] None    Subjective:  Mark [name] is a 66 y.o. [name] who presents for general follow up for HTN, hyperlipidemia, T2DM, and vitamin D Def.   Overall he reports doing well.    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  Enjoying at this time.  Requesting to have TB test for school health requirement.  BMI is Body mass index is 30.5 kg/m., he has been working on diet and exercise, goes  to gym 45 min daily.  Wt Readings from Last 3 Encounters:  01/11/22 228 lb (103.4 kg)  12/14/21  226 lb (102.5 kg)  11/01/21 228 lb (103.4 kg)   His blood pressure has been controlled at home, today their BP is BP: (!) 140/88  BP Readings from Last 3 Encounters:  01/11/22 (!) 140/88  12/14/21 134/80  11/01/21 130/68   He does workout. He denies chest pain, shortness of breath, dizziness.    He is on cholesterol medication (rosuvastatin 10 mg daily) and denies myalgias. His cholesterol is not at goal. The cholesterol last visit was:   Lab Results  Component Value Date   CHOL 205 (H) 08/04/2021   HDL 43 08/04/2021   LDLCALC 107 (H) 08/04/2021   TRIG 384 (H) 08/04/2021   CHOLHDL 4.8 08/04/2021   He has been working on diet and exercise for T2DM with CKD II, on Metformin 2000 mg daily, glipizide 5 mg at night, jardiance 25 mg, unable to tolerate Jardiance.  Also on ASA and ARB and denies hyperglycemia, hypoglycemia , increased appetite, nausea, paresthesia of the feet, polydipsia, polyuria and visual disturbances.  Reports fasting typically around 120 range  Has never had an eye exam and refuses Last A1C in the office was:  Lab Results  Component Value Date   HGBA1C 8.1 (H) 08/04/2021   Last GFR Lab Results  Component Value Date   GFRNONAA 65 07/29/2020    Patient is on Vitamin D supplement, taking 5000 IU recently.  Lab Results  Component Value Date   VD25OH 23 (L) 11/02/2020     He has a history of testosterone deficiency and is on testosterone replacement.  He states that the testosterone helps with his energy, libido, muscle mass. Denies symptoms of elevated estrogen or dihydrotestosterone.  He feels very fatigued when his testosterone runs out. Lab Results  Component Value Date   TESTOSTERONE 316 11/02/2020   Also prescribed finasteride 5 mg, tamsulosin daily for BPH, reports doing well.   Medication Review:  Current Outpatient Medications (Endocrine & Metabolic):    glipiZIDE (GLUCOTROL) 5 MG tablet, Take  1/2 to 1 tablet   3 x /day  with Meals   for elevated  blood sugars 150+.   metFORMIN (GLUCOPHAGE-XR) 500 MG 24 hr tablet, Take 1 tab daily with largest Meal for Diabetes.   Semaglutide (RYBELSUS) 3 MG TABS, Take 3 mg by mouth daily.   testosterone cypionate (DEPOTESTOSTERONE CYPIONATE) 200 MG/ML injection, Inject 2 ml into muscle every 2 weeks  Current Outpatient Medications (Cardiovascular):    rosuvastatin (CRESTOR) 10 MG tablet, Take one tablet every night for cholesterol.   rosuvastatin (CRESTOR) 10 MG tablet, Take  1 tablet  Daily for Cholesterol                                           /                     TAKE                  BY                    MOUTH                            ONCE DAILY   Current  Outpatient Medications (Analgesics):    aspirin 81 MG chewable tablet, Chew by mouth daily.  Current Outpatient Medications (Hematological):    Cyanocobalamin (VITAMIN B-12 PO), Take by mouth daily.  Current Outpatient Medications (Other):    blood glucose meter kit and supplies KIT, Dispense based on patient and insurance preference. Use up to four times daily as directed. (FOR ICD-9 250.00, 250.01).   Cholecalciferol 1000 units capsule, Take 10,000 Units by mouth daily.   famotidine (PEPCID) 40 MG tablet, Take 1 tablet (40 mg total) by mouth every evening.   finasteride (PROSCAR) 5 MG tablet, Take  1 tablet  Daily  for Prostate                                       /                                    Take                      by                     mouth                                    once daily   glucose blood test strip, Check blood glucose before meals & bedtime time and as needed.Check blood glucose before meals & bedtime time and as needed.   tamsulosin (FLOMAX) 0.4 MG CAPS capsule, TAKE 1 CAPSULE BY MOUTH AT BEDTIME FOR PROSTATE   Allergies: Allergies  Allergen Reactions   Codeine     REACTION: Anxiety    Current Problems (verified) has Hyperlipidemia associated with type 2 diabetes mellitus ([place]); Hypertension; Type  2 diabetes mellitus ([place]); GERD (gastroesophageal reflux disease); Vitamin D deficiency; Testosterone deficiency; Obesity (BMI 30.0-34.9); Medication management; Benign prostatic hyperplasia with urinary frequency; CKD stage 2 due to type 2 diabetes mellitus ([place]); B12 deficiency; History of colon polyps; and COVID-19 (03/29/2020) on their problem list.  Screening Tests Immunization History  Administered Date(s) Administered   Td 03/27/2005   Tdap 08/09/2015    Names of Other Physician/Practitioners you currently use: 1. [facility] here for primary care 2. [facility], gets checked annually at DOT physical, last 2021, request report for diabetes eye  3. Dr. Jenne Campus, dentist, last visit 2021, looking for new dentist  Patient Care Team: Unk Pinto, MD as PCP - General (Internal Medicine)  Surgical: He  has a past surgical history that includes Vasectomy (01/2004) and Urethral dilation (2007). Family His family history includes COPD in his father; Heart disease in his father; Hyperlipidemia in his mother; Hypertension in his father and mother. Social history  He reports that he has never smoked. He has never used smokeless tobacco. He reports that he does not drink alcohol and does not use drugs.   Objective:   Today's Vitals   01/11/22 1603  BP: (!) 140/88  Pulse: 69  Temp: (!) 97.5 F (36.4 C)  SpO2: 95%  Weight: 228 lb (103.4 kg)  Height: 6' 0.5" (1.842 m)     Body mass index is 30.5 kg/m.  General appearance: alert, no distress, WD/WN, [name] HEENT:  normocephalic, sclerae anicteric, TMs pearly, nares patent, no discharge or erythema, pharynx normal Oral cavity: MMM, no lesions Neck: supple, no lymphadenopathy, no thyromegaly, no masses Heart: RRR, normal S1, S2, no murmurs Lungs: CTA bilaterally, no wheezes, rhonchi, or rales Abdomen: +bs, soft, non tender, non distended, no masses, no hepatomegaly, no  splenomegaly Musculoskeletal: nontender, no swelling, no obvious deformity Extremities: no edema, no cyanosis, no clubbing Pulses: 2+ symmetric, upper and lower extremities, normal cap refill Neurological: alert, oriented x 3, CN2-12 intact, strength normal upper extremities and lower extremities, sensation normal throughout, DTRs 2+ throughout, no cerebellar signs, gait normal Psychiatric: normal affect, behavior normal, pleasant     Mark Jacinto, NP   01/11/2022

## 2022-01-12 ENCOUNTER — Encounter: Payer: Self-pay | Admitting: Nurse Practitioner

## 2022-01-12 ENCOUNTER — Other Ambulatory Visit: Payer: Self-pay | Admitting: Nurse Practitioner

## 2022-01-12 DIAGNOSIS — N182 Chronic kidney disease, stage 2 (mild): Secondary | ICD-10-CM

## 2022-01-12 LAB — COMPLETE METABOLIC PANEL WITH GFR
AG Ratio: 1.6 (calc) (ref 1.0–2.5)
ALT: 20 U/L (ref 9–46)
AST: 15 U/L (ref 10–35)
Albumin: 4.4 g/dL (ref 3.6–5.1)
Alkaline phosphatase (APISO): 117 U/L (ref 35–144)
BUN: 19 mg/dL (ref 7–25)
CO2: 27 mmol/L (ref 20–32)
Calcium: 9.2 mg/dL (ref 8.6–10.3)
Chloride: 103 mmol/L (ref 98–110)
Creat: 0.99 mg/dL (ref 0.70–1.35)
Globulin: 2.7 g/dL (calc) (ref 1.9–3.7)
Glucose, Bld: 237 mg/dL — ABNORMAL HIGH (ref 65–99)
Potassium: 4.1 mmol/L (ref 3.5–5.3)
Sodium: 138 mmol/L (ref 135–146)
Total Bilirubin: 0.4 mg/dL (ref 0.2–1.2)
Total Protein: 7.1 g/dL (ref 6.1–8.1)
eGFR: 84 mL/min/{1.73_m2} (ref >=60)

## 2022-01-12 LAB — CBC WITH DIFFERENTIAL/PLATELET
Absolute Monocytes: 459 cells/uL (ref 200–950)
Basophils Absolute: 37 cells/uL (ref 0–200)
Basophils Relative: 0.6 %
Eosinophils Absolute: 217 cells/uL (ref 15–500)
Eosinophils Relative: 3.5 %
HCT: 45.1 % (ref 38.5–50.0)
Hemoglobin: 15 g/dL (ref 13.2–17.1)
Lymphs Abs: 2406 cells/uL (ref 850–3900)
MCH: 29 pg (ref 27.0–33.0)
MCHC: 33.3 g/dL (ref 32.0–36.0)
MCV: 87.1 fL (ref 80.0–100.0)
MPV: 11.2 fL (ref 7.5–12.5)
Monocytes Relative: 7.4 %
Neutro Abs: 3081 cells/uL (ref 1500–7800)
Neutrophils Relative %: 49.7 %
Platelets: 236 10*3/uL (ref 140–400)
RBC: 5.18 10*6/uL (ref 4.20–5.80)
RDW: 11.9 % (ref 11.0–15.0)
Total Lymphocyte: 38.8 %
WBC: 6.2 10*3/uL (ref 3.8–10.8)

## 2022-01-12 LAB — LIPID PANEL
Cholesterol: 206 mg/dL — ABNORMAL HIGH (ref <200)
HDL: 43 mg/dL (ref >=40)
LDL Cholesterol (Calc): 126 mg/dL (calc) — ABNORMAL HIGH
Non-HDL Cholesterol (Calc): 163 mg/dL (calc) — ABNORMAL HIGH (ref <130)
Total CHOL/HDL Ratio: 4.8 (calc) (ref <5.0)
Triglycerides: 228 mg/dL — ABNORMAL HIGH (ref <150)

## 2022-01-12 LAB — HEMOGLOBIN A1C
Hgb A1c MFr Bld: 9.7 % of total Hgb — ABNORMAL HIGH (ref <5.7)
Mean Plasma Glucose: 232 mg/dL
eAG (mmol/L): 12.8 mmol/L

## 2022-01-12 LAB — INSULIN, RANDOM: Insulin: 10.6 u[IU]/mL

## 2022-01-12 MED ORDER — TIRZEPATIDE 2.5 MG/0.5ML ~~LOC~~ SOAJ: 2.5000 mg | SUBCUTANEOUS | 2 refills | Status: DC

## 2022-01-17 LAB — QUANTIFERON-TB GOLD PLUS
Mitogen-NIL: >10 IU/mL
NIL: 0.04 IU/mL
QuantiFERON-TB Gold Plus: NEGATIVE
TB1-NIL: 0.01 IU/mL
TB2-NIL: 0.01 IU/mL

## 2022-01-31 ENCOUNTER — Encounter: Payer: Self-pay | Admitting: Nurse Practitioner

## 2022-01-31 DIAGNOSIS — E1122 Type 2 diabetes mellitus with diabetic chronic kidney disease: Secondary | ICD-10-CM

## 2022-01-31 DIAGNOSIS — N182 Chronic kidney disease, stage 2 (mild): Secondary | ICD-10-CM

## 2022-01-31 DIAGNOSIS — E349 Endocrine disorder, unspecified: Secondary | ICD-10-CM

## 2022-01-31 MED ORDER — RYBELSUS 7 MG PO TABS: 7.0000 mg | ORAL_TABLET | Freq: Every day | ORAL | 0 refills | Status: DC

## 2022-02-07 ENCOUNTER — Other Ambulatory Visit: Payer: Self-pay | Admitting: Nurse Practitioner

## 2022-02-07 DIAGNOSIS — N401 Enlarged prostate with lower urinary tract symptoms: Secondary | ICD-10-CM

## 2022-02-09 ENCOUNTER — Ambulatory Visit: Payer: Medicare Other

## 2022-02-22 ENCOUNTER — Ambulatory Visit (INDEPENDENT_AMBULATORY_CARE_PROVIDER_SITE_OTHER): Payer: Medicare Other

## 2022-02-22 VITALS — BP 140/82 | HR 70 | Temp 97.5°F | Ht 72.5 in | Wt 221.0 lb

## 2022-02-22 DIAGNOSIS — E349 Endocrine disorder, unspecified: Secondary | ICD-10-CM

## 2022-02-22 MED ORDER — TESTOSTERONE CYPIONATE 200 MG/ML IM SOLN
200.0000 mg | Freq: Once | INTRAMUSCULAR | Status: AC
  Administered 2022-02-22: 200 mg via INTRAMUSCULAR

## 2022-02-22 NOTE — Progress Notes (Signed)
Patient presents to the office for a Testosterone Cypionate 200 mg/ml 2 ml IM injection in LEFT upper outer quadrant. Patient tolerated well  

## 2022-03-07 ENCOUNTER — Ambulatory Visit (INDEPENDENT_AMBULATORY_CARE_PROVIDER_SITE_OTHER): Payer: Medicare Other | Admitting: Nurse Practitioner

## 2022-03-07 ENCOUNTER — Encounter: Payer: Self-pay | Admitting: Nurse Practitioner

## 2022-03-07 ENCOUNTER — Other Ambulatory Visit: Payer: Self-pay

## 2022-03-07 VITALS — BP 122/80 | HR 84 | Temp 97.5°F | Ht 72.5 in | Wt 225.2 lb

## 2022-03-07 DIAGNOSIS — I10 Essential (primary) hypertension: Secondary | ICD-10-CM | POA: Diagnosis not present

## 2022-03-07 DIAGNOSIS — R6889 Other general symptoms and signs: Secondary | ICD-10-CM

## 2022-03-07 DIAGNOSIS — J069 Acute upper respiratory infection, unspecified: Secondary | ICD-10-CM | POA: Diagnosis not present

## 2022-03-07 DIAGNOSIS — H6123 Impacted cerumen, bilateral: Secondary | ICD-10-CM | POA: Diagnosis not present

## 2022-03-07 DIAGNOSIS — Z1152 Encounter for screening for COVID-19: Secondary | ICD-10-CM

## 2022-03-07 LAB — POCT INFLUENZA A/B
Influenza A, POC: NEGATIVE
Influenza B, POC: NEGATIVE

## 2022-03-07 LAB — POC COVID19 BINAXNOW: SARS Coronavirus 2 Ag: NEGATIVE

## 2022-03-07 MED ORDER — PROMETHAZINE-DM 6.25-15 MG/5ML PO SYRP: 5.0000 mL | ORAL_SOLUTION | Freq: Four times a day (QID) | ORAL | 1 refills | Status: DC | PRN

## 2022-03-07 MED ORDER — DEXAMETHASONE 2 MG PO TABS: ORAL_TABLET | ORAL | 0 refills | Status: DC

## 2022-03-07 MED ORDER — AZITHROMYCIN 250 MG PO TABS: ORAL_TABLET | ORAL | 1 refills | Status: DC

## 2022-03-07 NOTE — Progress Notes (Signed)
Assessment and Plan:   Demico was seen today for acute visit.  Diagnoses and all orders for this visit:  Flu-like symptoms -     POCT Influenza A/B- negative  Encounter for screening for COVID-19 -     POC COVID-19  Essential hypertension - continue DASH diet, exercise and monitor at home. Call if greater than 130/80.   URI, acute Push fluids, use Mucinex as needed If no improvement in symptoms in next 72 hours notify the office -     dexamethasone (DECADRON) 2 MG tablet; Take 3 tabs for 3 days, 2 tabs for 3 days 1 tab for 5 days. Take with food. -     azithromycin (ZITHROMAX) 250 MG tablet; Take 2 tablets (500 mg) on  Day 1,  followed by 1 tablet (250 mg) once daily on Days 2 through 5. -     promethazine-dextromethorphan (PROMETHAZINE-DM) 6.25-15 MG/5ML syrup; Take 5 mLs by mouth 4 (four) times daily as needed for cough.  Impacted cerumen bilaterally - Ear lavage bilaterally with removal of all wax.   Further disposition pending results of labs. Discussed med's effects and SE's.   Over 30 minutes of exam, counseling, chart review, and critical decision making was performed.   Future Appointments  Date Time Provider [place]  08/03/2022  9:00 AM Alycia Rossetti, NP [facility] None    ------------------------------------------------------------------------------------------------------------------   HPI BP 122/80   Pulse 84   Temp (!) 97.5 F (36.4 C)   Ht 6' 0.5" (1.842 m)   Wt 225 lb 3.2 oz (102.2 kg)   SpO2 94%   BMI 30.12 kg/m    65 y.o.male presents for Sore throat, congestion and  productive cough of dark mucus. Symptoms have been present x 4 days. Has not tried any over the counter medications  BP is currently well controlled without medication  Denies chest pain, shortness of breath and dizziness BP Readings from Last 3 Encounters:  03/07/22 122/80  02/22/22 (!) 140/82  01/11/22 (!) 140/88    He is currently on Metformin 500 mg QD ,  Glipizide 5 mg 1/2 tab TID with meals and Jardiance 10 mg.  He has stopped Rybelsus.  He is checking fasting blood sugars 110-130.  Lab Results  Component Value Date   HGBA1C 9.7 (H) 01/11/2022     Past Medical History:  Diagnosis Date   GERD (gastroesophageal reflux disease)    Hyperlipidemia    Hypertension    Other testicular hypofunction    Prediabetes    Vitamin D deficiency      Allergies  Allergen Reactions   Codeine     REACTION: Anxiety    Current Outpatient Medications on File Prior to Visit  Medication Sig   aspirin 81 MG chewable tablet Chew by mouth daily.   blood glucose meter kit and supplies KIT Dispense based on patient and insurance preference. Use up to four times daily as directed. (FOR ICD-9 250.00, 250.01).   Cholecalciferol 1000 units capsule Take 10,000 Units by mouth daily.   Cyanocobalamin (VITAMIN B-12 PO) Take by mouth daily.   empagliflozin (JARDIANCE) 10 MG TABS tablet Take by mouth daily.   famotidine (PEPCID) 40 MG tablet Take 1 tablet (40 mg total) by mouth every evening.   finasteride (PROSCAR) 5 MG tablet Take  1 tablet  Daily  for Prostate                                       /  Take                      by                     mouth                                    once daily   glipiZIDE (GLUCOTROL) 5 MG tablet Take  1/2 to 1 tablet   3 x /day  with Meals   for elevated blood sugars 150+.   glucose blood test strip Check blood glucose before meals & bedtime time and as needed.Check blood glucose before meals & bedtime time and as needed.   metFORMIN (GLUCOPHAGE-XR) 500 MG 24 hr tablet Take 1 tab daily with largest Meal for Diabetes.   rosuvastatin (CRESTOR) 10 MG tablet Take one tablet every night for cholesterol.   rosuvastatin (CRESTOR) 10 MG tablet Take  1 tablet  Daily for Cholesterol                                           /                     TAKE                  BY                    MOUTH                             ONCE DAILY   tamsulosin (FLOMAX) 0.4 MG CAPS capsule TAKE 1 CAPSULE BY MOUTH AT BEDTIME FOR PROSTATE   testosterone cypionate (DEPOTESTOSTERONE CYPIONATE) 200 MG/ML injection Inject 2 ml into muscle every 2 weeks   Semaglutide (RYBELSUS) 7 MG TABS Take 7 mg by mouth daily. (Patient not taking: Reported on 03/07/2022)   No current facility-administered medications on file prior to visit.    ROS: all negative except above.   Physical Exam:  BP 122/80   Pulse 84   Temp (!) 97.5 F (36.4 C)   Ht 6' 0.5" (1.842 m)   Wt 225 lb 3.2 oz (102.2 kg)   SpO2 94%   BMI 30.12 kg/m   General Appearance: Well nourished, in no apparent distress. Eyes: PERRLA, EOMs, conjunctiva no swelling or erythema Sinuses: Mild maxillary tenderness ENT/Mouth: Ext aud canals clear, TMs are obscured by wax bilaterally. Ear lavage performed- TM dusky, no fluid or erythema. No erythema, swelling, or exudate on post pharynx.  Neck: Supple, thyroid normal.  Respiratory: Respiratory effort normal, BS equal bilaterally without rales, rhonchi, wheezing or stridor.  Cardio: RRR with no MRGs. Brisk peripheral pulses without edema.  Abdomen: Soft, + BS.  Non tender, no guarding, rebound, hernias, masses. Lymphatics: positive left cervical adenopathy Musculoskeletal: Full ROM, 5/5 strength, normal gait.  Skin: Warm, dry without rashes, lesions, ecchymosis.  Neuro: Cranial nerves intact. Normal muscle tone, no cerebellar symptoms. Sensation intact.  Psych: Awake and oriented X 3, normal affect, Insight and Judgment appropriate.     Alycia Rossetti, NP 9:59 AM [facility]

## 2022-03-07 NOTE — Patient Instructions (Signed)

## 2022-03-13 MED ORDER — PREDNISONE 20 MG PO TABS: ORAL_TABLET | ORAL | 0 refills | Status: DC

## 2022-03-23 MED ORDER — SEMAGLUTIDE(0.25 OR 0.5MG/DOS) 2 MG/3ML ~~LOC~~ SOPN: 0.2500 mg | PEN_INJECTOR | SUBCUTANEOUS | 0 refills | Status: DC

## 2022-03-30 ENCOUNTER — Telehealth: Payer: Self-pay

## 2022-03-30 NOTE — Telephone Encounter (Signed)
PA request for Ozempic completed and submitted through CoverMyMeds

## 2022-04-07 ENCOUNTER — Other Ambulatory Visit: Payer: Self-pay | Admitting: Nurse Practitioner

## 2022-04-07 DIAGNOSIS — E1122 Type 2 diabetes mellitus with diabetic chronic kidney disease: Secondary | ICD-10-CM

## 2022-04-07 MED ORDER — DEXCOM G4 PLATINUM TRANSMITTER MISC: 1.0000 | 0 refills | Status: DC

## 2022-04-07 MED ORDER — DEXCOM G4 PLATINUM RECEIVER DEVI: 1.0000 | 0 refills | Status: DC

## 2022-04-07 MED ORDER — DEXCOM G4 SENSOR MISC: 1.0000 | 0 refills | Status: DC

## 2022-04-12 MED ORDER — TESTOSTERONE CYPIONATE 200 MG/ML IM SOLN: INTRAMUSCULAR | 0 refills | Status: DC

## 2022-04-13 NOTE — Telephone Encounter (Signed)
PA approved.

## 2022-04-14 ENCOUNTER — Other Ambulatory Visit: Payer: Self-pay | Admitting: Nurse Practitioner

## 2022-04-14 MED ORDER — DEXCOM G7 RECEIVER DEVI: 1.0000 | 0 refills | Status: DC

## 2022-04-14 MED ORDER — DEXCOM G6 TRANSMITTER MISC: 1.0000 | 0 refills | Status: DC

## 2022-04-14 MED ORDER — DEXCOM G7 SENSOR MISC: 1.0000 | 0 refills | Status: DC

## 2022-04-17 ENCOUNTER — Ambulatory Visit (INDEPENDENT_AMBULATORY_CARE_PROVIDER_SITE_OTHER): Payer: Medicare Other

## 2022-04-17 VITALS — BP 130/82 | HR 73 | Temp 97.5°F | Ht 72.5 in | Wt 223.0 lb

## 2022-04-17 DIAGNOSIS — E349 Endocrine disorder, unspecified: Secondary | ICD-10-CM | POA: Diagnosis not present

## 2022-04-17 MED ORDER — TESTOSTERONE CYPIONATE 200 MG/ML IM SOLN
200.0000 mg | Freq: Once | INTRAMUSCULAR | Status: AC
  Administered 2022-04-17: 200 mg via INTRAMUSCULAR

## 2022-04-17 NOTE — Progress Notes (Signed)
Patient presents to the office for a Testosterone Cypionate 200 mg/ml 2 ml IM injection in RIGHT upper outer quadrant. Patient tolerated well  

## 2022-05-17 ENCOUNTER — Other Ambulatory Visit: Payer: Self-pay | Admitting: Nurse Practitioner

## 2022-05-17 DIAGNOSIS — N401 Enlarged prostate with lower urinary tract symptoms: Secondary | ICD-10-CM

## 2022-05-23 ENCOUNTER — Ambulatory Visit (INDEPENDENT_AMBULATORY_CARE_PROVIDER_SITE_OTHER): Payer: Medicare Other

## 2022-05-23 VITALS — BP 148/78 | HR 62 | Temp 97.9°F | Ht 72.5 in | Wt 221.8 lb

## 2022-05-23 DIAGNOSIS — E349 Endocrine disorder, unspecified: Secondary | ICD-10-CM

## 2022-05-23 MED ORDER — TESTOSTERONE CYPIONATE 200 MG/ML IM SOLN
200.0000 mg | Freq: Once | INTRAMUSCULAR | Status: AC
  Administered 2022-05-23: 200 mg via INTRAMUSCULAR

## 2022-05-23 NOTE — Progress Notes (Signed)
Patient presents to the office for a Testosterone Cypionate 200 mg/ml 2 ml IM injection in LEFT upper outer quadrant. Patient tolerated well

## 2022-06-12 ENCOUNTER — Ambulatory Visit (INDEPENDENT_AMBULATORY_CARE_PROVIDER_SITE_OTHER): Payer: Medicare Other

## 2022-06-12 VITALS — BP 140/88 | HR 81 | Temp 97.3°F | Ht 72.5 in | Wt 225.0 lb

## 2022-06-12 DIAGNOSIS — E349 Endocrine disorder, unspecified: Secondary | ICD-10-CM

## 2022-06-12 MED ORDER — TESTOSTERONE CYPIONATE 200 MG/ML IM SOLN
200.0000 mg | Freq: Once | INTRAMUSCULAR | Status: AC
  Administered 2022-06-12: 200 mg via INTRAMUSCULAR

## 2022-06-12 NOTE — Progress Notes (Signed)
Patient presents to the office for a Testosterone Cypionate 200 mg/ml 2 ml IM injection in RIGHT upper outer quadrant. Patient tolerated well

## 2022-07-07 ENCOUNTER — Other Ambulatory Visit: Payer: Self-pay | Admitting: Nurse Practitioner

## 2022-07-07 DIAGNOSIS — E1122 Type 2 diabetes mellitus with diabetic chronic kidney disease: Secondary | ICD-10-CM

## 2022-07-10 ENCOUNTER — Encounter: Payer: Self-pay | Admitting: Internal Medicine

## 2022-07-10 ENCOUNTER — Ambulatory Visit (INDEPENDENT_AMBULATORY_CARE_PROVIDER_SITE_OTHER): Payer: Medicare Other | Admitting: Internal Medicine

## 2022-07-10 VITALS — BP 160/98 | HR 67 | Temp 98.0°F | Resp 17 | Ht 72.05 in | Wt 221.0 lb

## 2022-07-10 DIAGNOSIS — E1122 Type 2 diabetes mellitus with diabetic chronic kidney disease: Secondary | ICD-10-CM

## 2022-07-10 DIAGNOSIS — N182 Chronic kidney disease, stage 2 (mild): Secondary | ICD-10-CM

## 2022-07-10 DIAGNOSIS — R0989 Other specified symptoms and signs involving the circulatory and respiratory systems: Secondary | ICD-10-CM | POA: Diagnosis not present

## 2022-07-10 MED ORDER — TIRZEPATIDE 2.5 MG/0.5ML ~~LOC~~ SOAJ: SUBCUTANEOUS | 0 refills | Status: DC

## 2022-07-10 NOTE — Progress Notes (Signed)
Future Appointments  Date Time Provider Department  08/03/2022  9:00 AM Raynelle Dick, NP [facility]    History of Present Illness:       This very nice 67 y.o.  MWM with HTN, HLD, T2_NIDDM  and Vitamin D Deficiency presents with 3-4 day hx/o "dizziness".       BPs checked by nurse were elevated at 160/98 sitting  and 180/88 standing  and rechecked later by myself at 160/90.  Apparently he has self discontinued his Olmesartan.       Patient relates that he is taking Rybelsus 7 mg and never filled the Ozempic due to $300 co-pay.     Current Outpatient Medications on File Prior to Visit  Medication Sig   aspirin 81 MG tablet daily.   Cholecalciferol 1000 units capsule Take 10,000 Units  daily.   VITAMIN B-12  Take  daily.   finasteride (PROSCAR) 5 MG tablet Take  1 tablet  Daily     glipiZIDE  5 MG tablet Take  1/2 to 1 tablet   3 x /day  with Meals   for elevated blood sugars 150+.   metFORMIN -XR 500 MG 24 hr  Take 1 tab daily with largest Meal for Diabetes.   Rybelsus 7 mg  Takes 1 tablet Daily    rosuvastatin (0 MG tablet Take one tablet every night for cholesterol.   tamsulosin 0.4 MG CAPS  TAKE 1 CAPSULE AT BEDTIME    testosterone cypio200 MG/ML inj Inject 2 ml into muscle every 2 weeks     Allergies  Allergen Reactions   Codeine     REACTION: Anxiety     Problem list He has Hyperlipidemia associated with type 2 diabetes mellitus; Hypertension; Type 2 diabetes mellitus (HCC); GERD (gastroesophageal reflux disease); Vitamin D deficiency; Testosterone deficiency; Obesity (BMI 30.0-34.9); Medication management; Benign prostatic hyperplasia with urinary frequency; CKD stage 2 due to type 2 diabetes mellitus; B12 deficiency; History of colon polyps; and COVID-19 (03/29/2020) on their problem list.   Observations/Objective:  BP (!) 160/98 Comment: 180/88 standing  Pulse 67 Comment: 72 standing  Temp 98 F (36.7 C)   Resp 17   Ht 6' 0.05" (1.83 m)   Wt 221 lb  (100.2 kg)   SpO2 96%   BMI 29.93 kg/m   HEENT - WNL. EACs clear & TMs Normal.  Neck - supple.  Chest - Clear equal BS. Cor - Nl HS. RRR w/o sig MGR. PP 1(+). No edema. MS- FROM w/o deformities.  Gait Nl. Neuro -  Nl w/o focal abnormalities.   Assessment and Plan:   1. Labile hypertension  - Advised restart Olmesartan & monitor BPs & call if remain elevated.   2. Type 2 diabetes mellitus with stage 2 chronic kidney disease, without long-term current use of insulin  - tirzepatide (MOUNJARO) 2.5 MG/0.5ML Pen;  Inject 1 pen (2.5 mg)   into  Skin every 7 days      Dispense: 2 mL; Refill: 0   (Advised to increase his Rybelsus 7 mg to 2 tabs = 14 mg til see if  can get a GLP-1 inh approved at a reasonable co-pay)    Follow Up Instructions:        I discussed the assessment and treatment plan with the patient. The patient was provided an opportunity to ask questions and all were answered. The patient agreed with the plan and demonstrated an understanding of the instructions.  The patient was advised to call back or seek an in-person evaluation if the symptoms worsen or if the condition fails to improve as anticipated.    Kirtland Bouchard, MD

## 2022-07-17 ENCOUNTER — Ambulatory Visit (INDEPENDENT_AMBULATORY_CARE_PROVIDER_SITE_OTHER): Payer: Medicare Other

## 2022-07-17 VITALS — BP 128/78 | HR 84 | Temp 97.7°F | Wt 222.6 lb

## 2022-07-17 DIAGNOSIS — E349 Endocrine disorder, unspecified: Secondary | ICD-10-CM | POA: Diagnosis not present

## 2022-07-17 MED ORDER — TESTOSTERONE CYPIONATE 200 MG/ML IM SOLN
200.0000 mg | Freq: Once | INTRAMUSCULAR | Status: AC
  Administered 2022-07-17: 200 mg via INTRAMUSCULAR

## 2022-07-17 NOTE — Progress Notes (Signed)
Patient presents to the office for a Testosterone Cypionate 200 mg/ml 2 ml IM injection in LEFT outer quadrant. Patient tolerated well

## 2022-07-24 ENCOUNTER — Other Ambulatory Visit: Payer: Self-pay

## 2022-07-24 DIAGNOSIS — E1122 Type 2 diabetes mellitus with diabetic chronic kidney disease: Secondary | ICD-10-CM

## 2022-07-24 MED ORDER — METFORMIN HCL ER 500 MG PO TB24: ORAL_TABLET | ORAL | 3 refills | Status: DC

## 2022-08-02 ENCOUNTER — Ambulatory Visit: Payer: Medicare Other | Admitting: Nurse Practitioner

## 2022-08-02 DIAGNOSIS — Z91148 Patient's other noncompliance with medication regimen for other reason: Secondary | ICD-10-CM

## 2022-08-02 NOTE — Progress Notes (Signed)
[facility] Customer service manager [facility] Quality Pharmacy Team Statin Quality Measure Assessment   08/02/2022  Mark [name] [birth year] [id number]  Per review of chart and payor information, patient has a diagnosis of diabetes but is not currently filling a statin prescription.  This places patient into the Statin Use In Patients with Diabetes (SUPD) measure for CMS.    Patient is non-adherent to statin prescription, last filled rosuvastatin 20mg  on 09/12/21 for a 90 day supply.   The 10-year ASCVD risk score (Arnett DK, et al., 2019) is: 28.9%   Values used to calculate the score:     Age: 67 years     Sex: Male     Is Non-Hispanic African American: No     Diabetic: Yes     Tobacco smoker: No     Systolic Blood Pressure: 128 mmHg     Is BP treated: No     HDL Cholesterol: 43 mg/dL     Total Cholesterol: 206 mg/dL 40/98/1191     Component Value Date/Time   CHOL 206 (H) 01/11/2022 1654   TRIG 228 (H) 01/11/2022 1654   HDL 43 01/11/2022 1654   CHOLHDL 4.8 01/11/2022 1654   VLDL 68 (H) 10/04/2016 1036   LDLCALC 126 (H) 01/11/2022 1654    Please consider ONE of the following recommendations:  Initiate moderate intensity statin Atorvastatin 10 mg once daily, #90, 3 refills   Rosuvastatin 10 mg once daily, #90, 3 refills    Initiate low intensity          statin with reduced frequency if prior          statin intolerance 1x weekly, #13, 3 refills   2x weekly, #26, 3 refills   3x weekly, #39, 3 refills    Code for past statin intolerance or  other exclusions (required annually)  Provider Requirements: Associate code during an office visit or telehealth encounter  Drug Induced Myopathy G72.0   Myopathy, unspecified G72.9   Myositis, unspecified M60.9   Rhabdomyolysis M62.82   Cirrhosis of liver K74.69   Prediabetes R73.03   PCOS E28.2   Thank you for allowing [facility] pharmacy to be a part of this patient's care.   Harlon Flor, PharmD Clinical Pharmacist  [facility] [employer] [phone]

## 2022-08-02 NOTE — Progress Notes (Deleted)
MEDICARE ANNUAL WELLNESS VISIT AND FOLLOW UP Assessment:   Mark [name] was seen today for medicare wellness and follow-up.  Diagnoses and all orders for this visit: Encounter for Medicare Annual Wellness Visit Due annually; declines vaccines today  Refuses Colonoscopy, agrees to cologuard- ordered today  Essential hypertension Continue medication Monitor blood pressure at home; call if consistently over 130/80 Continue DASH diet.   Reminder to go to the ER if any CP, SOB, nausea, dizziness, severe HA, changes vision/speech, left arm numbness and tingling and jaw pain. -     CBC with Differential/Platelet -     COMPLETE METABOLIC PANEL WITH GFR   Type 2 diabetes mellitus with stage 2 chronic kidney disease, without long-term current use of insulin [facility]) Education: Reviewed 'ABCs' of diabetes management (respective goals in parentheses):  A1C (<7), blood pressure (<130/80), and cholesterol (LDL <70) Eye Exam yearly (report requested) and Dental Exam every 6 months. Refuses eye exam and has never had- counseled on eye changes that can occur with diabetes Dietary recommendations Physical Activity recommendations -     COMPLETE METABOLIC PANEL WITH GFR -     Hemoglobin A1c  Hyperlipidemia associated with type 2 diabetes mellitus (HCC) Continue medications: newly taking rosuvastatin 5 mg daily LDL goal <70, titrate for goal  Continue low cholesterol diet and exercise.  Check lipid panel.  -     Lipid panel -     TSH  CKD stage 2 due to type 2 diabetes mellitus (HCC) Increase fluids, avoid NSAIDS, monitor sugars, will monitor On ARB, glipizide 5mg  at bedtime, jardiance 25 mg and Metformin 500 mg 2 tabs BID -     COMPLETE METABOLIC PANEL WITH GFR  Gastroesophageal reflux disease, unspecified whether esophagitis present Recently controlled off of meds; lifestyle reviewed   B12 deficiency Continue supplement;   Vitamin D deficiency Continue Vit D supplementation to maintain value in  therapeutic level of 60-100   Obesity (BMI 30.0-34.9) Long discussion about weight loss, diet, and exercise Recommended diet heavy in fruits and veggies and low in animal meats, cheeses, and dairy products, appropriate calorie intake Discussed appropriate weight for height and initial goal (<210lb ) Follow up at next visit  Benign prostatic hyperplasia with urinary frequency Continue meds; well controlled; check PSA annually at CPE  Testosterone deficiency Continued perceived benefits with supplementation, no SE. Check level PRN, continue zinc, no symptoms of elevated estrogen or dihydrotestosterone.   Medication management -     CBC with Differential/Platelet -     COMPLETE METABOLIC PANEL WITH GFR   History of colon polyps Pt declines colonoscopy, agrees to do cologuard- order placed Pt is counseled on risks  Over 30 minutes of exam, counseling, chart review, and critical decision making was performed  Future Appointments  Date Time Provider Department Center  08/03/2022  9:00 AM Raynelle Dick, NP [facility] None     Plan:   During the course of the visit the patient was educated and counseled about appropriate screening and preventive services including:   Pneumococcal vaccine  Influenza vaccine Prevnar 13 Td vaccine Screening electrocardiogram Colorectal cancer screening Diabetes screening Glaucoma screening Nutrition counseling    Subjective:  Mark [name] is a 67 y.o. male who presents for Medicare Annual Wellness Visit and 3 month follow up for HTN, hyperlipidemia, T2DM, and vitamin D Def.   BMI is There is no height or weight on file to calculate BMI., he has been working on diet and exercise, goes to gym 45 min daily.  Encouraged water, not eating as many fruit and vegetables Wt Readings from Last 3 Encounters:  07/17/22 222 lb 9.6 oz (101 kg)  07/10/22 221 lb (100.2 kg)  06/12/22 225 lb (102.1 kg)   His blood pressure has been controlled at home,  today their BP is    BP Readings from Last 3 Encounters:  07/17/22 128/78  07/10/22 (!) 160/98  06/12/22 (!) 140/88   He does workout. He denies chest pain, shortness of breath, dizziness.    He is on cholesterol medication (rosuvastatin 10 mg daily) and denies myalgias. His cholesterol is not at goal. The cholesterol last visit was:   Lab Results  Component Value Date   CHOL 206 (H) 01/11/2022   HDL 43 01/11/2022   LDLCALC 126 (H) 01/11/2022   TRIG 228 (H) 01/11/2022   CHOLHDL 4.8 01/11/2022   He has been working on diet and exercise for T2DM with CKD II, on Metformin 2000 mg daily, glipizide 5 mg at night, jardiance 25 mg, on ASA and ARB and denies hyperglycemia, hypoglycemia , increased appetite, nausea, paresthesia of the feet, polydipsia, polyuria and visual disturbances.  Reports fasting typically around 120 range  Has never had an eye exam and refuses Last A1C in the office was:  Lab Results  Component Value Date   HGBA1C 9.7 (H) 01/11/2022   Last GFR Lab Results  Component Value Date   GFRNONAA 65 07/29/2020    Patient is on Vitamin D supplement, taking 5000 IU recently.  Lab Results  Component Value Date   VD25OH 23 (L) 11/02/2020     He has a history of testosterone deficiency and is on testosterone replacement. He is taking 400 cc q96m, last 1 week ago 07/22/2020. He states that the testosterone helps with his energy, libido, muscle mass. Denies symptoms of elevated estrogen or dihydrotestosterone.  He feels very fatigued when his testosterone runs out. Lab Results  Component Value Date   TESTOSTERONE 316 11/02/2020   Also prescribed finasteride 5 mg, tamsulosin daily for BPH, reports doing well.   Medication Review:  Current Outpatient Medications (Endocrine & Metabolic):    glipiZIDE (GLUCOTROL) 5 MG tablet, Take  1/2 to 1 tablet   3 x /day  with Meals   for elevated blood sugars 150+.   metFORMIN (GLUCOPHAGE-XR) 500 MG 24 hr tablet, Take 1 tab daily with  largest Meal for Diabetes.   testosterone cypionate (DEPOTESTOSTERONE CYPIONATE) 200 MG/ML injection, Inject 2 ml into muscle every 2 weeks   tirzepatide (MOUNJARO) 2.5 MG/0.5ML Pen, Inject 1 pen (2.5 mg)   into  Skin every 7 days   for Diabetes     ( e11.9 )  Current Outpatient Medications (Cardiovascular):    rosuvastatin (CRESTOR) 10 MG tablet, Take one tablet every night for cholesterol.   rosuvastatin (CRESTOR) 10 MG tablet, Take  1 tablet  Daily for Cholesterol                                           /                     TAKE                  BY                    MOUTH  ONCE DAILY   Current Outpatient Medications (Analgesics):    aspirin 81 MG chewable tablet, Chew by mouth daily.  Current Outpatient Medications (Hematological):    Cyanocobalamin (VITAMIN B-12 PO), Take by mouth daily.  Current Outpatient Medications (Other):    blood glucose meter kit and supplies KIT, Dispense based on patient and insurance preference. Use up to four times daily as directed. (FOR ICD-9 250.00, 250.01).   Cholecalciferol 1000 units capsule, Take 10,000 Units by mouth daily.   finasteride (PROSCAR) 5 MG tablet, Take  1 tablet  Daily  for Prostate                                       /                                    Take                      by                     mouth                                    once daily   glucose blood test strip, Check blood glucose before meals & bedtime time and as needed.Check blood glucose before meals & bedtime time and as needed.   tamsulosin (FLOMAX) 0.4 MG CAPS capsule, TAKE 1 CAPSULE BY MOUTH AT BEDTIME FOR PROSTATE   Allergies: Allergies  Allergen Reactions   Codeine     REACTION: Anxiety    Current Problems (verified) has Hyperlipidemia associated with type 2 diabetes mellitus (HCC); Hypertension; Type 2 diabetes mellitus (HCC); GERD (gastroesophageal reflux disease); Vitamin D deficiency; Testosterone deficiency; Obesity  (BMI 30.0-34.9); Medication management; Benign prostatic hyperplasia with urinary frequency; CKD stage 2 due to type 2 diabetes mellitus (HCC); B12 deficiency; History of colon polyps; and COVID-19 (03/29/2020) on their problem list.  Screening Tests Immunization History  Administered Date(s) Administered   Td 03/27/2005   Tdap 08/09/2015    Preventative care: Last colonoscopy: 11/04/2009 Dr. Russella Dar, polyps- refuses another  Prior vaccinations: TD or Tdap: 2017  Influenza: declines   Pneumococcal: declines Shingles/Zostavax: declines  Covid 19: defer   Names of Other Physician/Practitioners you currently use: 1. [facility] here for primary care 2. [facility], gets checked annually at DOT physical, last 2021, request report for diabetes eye  3. Dr. Gerilyn Pilgrim, dentist, last visit 2021, looking for new dentist  Patient Care Team: Lucky Cowboy, MD as PCP - General (Internal Medicine)  Surgical: He  has a past surgical history that includes Vasectomy (01/2004) and Urethral dilation (2007). Family His family history includes COPD in his father; Heart disease in his father; Hyperlipidemia in his mother; Hypertension in his father and mother. Social history  He reports that he has never smoked. He has never used smokeless tobacco. He reports that he does not drink alcohol and does not use drugs.  MEDICARE WELLNESS OBJECTIVES: Physical activity:   Cardiac risk factors:   Depression/mood screen:      08/04/2021    2:28 PM  Depression screen PHQ 2/9  Decreased Interest 0  Down, Depressed, Hopeless  0  PHQ - 2 Score 0    ADLs:     08/04/2021    2:28 PM  In your present state of health, do you have any difficulty performing the following activities:  Hearing? 0  Vision? 0  Difficulty concentrating or making decisions? 0  Walking or climbing stairs? 0  Dressing or bathing? 0  Doing errands, shopping? 0     Cognitive  Testing  Alert? Yes  Normal Appearance?Yes  Oriented to person? Yes  Place? Yes   Time? Yes  Recall of three objects?  Yes  Can perform simple calculations? Yes  Displays appropriate judgment?Yes  Can read the correct time from a watch face?Yes  EOL planning:     Objective:   There were no vitals filed for this visit.   There is no height or weight on file to calculate BMI.  General appearance: alert, no distress, WD/WN, male HEENT: normocephalic, sclerae anicteric, TMs pearly, nares patent, no discharge or erythema, pharynx normal Oral cavity: MMM, no lesions Neck: supple, no lymphadenopathy, no thyromegaly, no masses Heart: RRR, normal S1, S2, no murmurs Lungs: CTA bilaterally, no wheezes, rhonchi, or rales Abdomen: +bs, soft, non tender, non distended, no masses, no hepatomegaly, no splenomegaly Musculoskeletal: nontender, no swelling, no obvious deformity Extremities: no edema, no cyanosis, no clubbing Pulses: 2+ symmetric, upper and lower extremities, normal cap refill Neurological: alert, oriented x 3, CN2-12 intact, strength normal upper extremities and lower extremities, sensation normal throughout, DTRs 2+ throughout, no cerebellar signs, gait normal Psychiatric: normal affect, behavior normal, pleasant     Medicare Attestation I have personally reviewed: The patient's medical and social history Their use of alcohol, tobacco or illicit drugs Their current medications and supplements The patient's functional ability including ADLs,fall risks, home safety risks, cognitive, and hearing and visual impairment Diet and physical activities Evidence for depression or mood disorders  The patient's weight, height, BMI, and visual acuity have been recorded in the chart.  I have made referrals, counseling, and provided education to the patient based on review of the above and I have provided the patient with a written personalized care plan for preventive services.     Raynelle Dick, NP   08/02/2022

## 2022-08-03 ENCOUNTER — Ambulatory Visit: Payer: Medicare Other | Admitting: Nurse Practitioner

## 2022-08-03 DIAGNOSIS — E291 Testicular hypofunction: Secondary | ICD-10-CM

## 2022-08-03 DIAGNOSIS — E1122 Type 2 diabetes mellitus with diabetic chronic kidney disease: Secondary | ICD-10-CM

## 2022-08-03 DIAGNOSIS — K219 Gastro-esophageal reflux disease without esophagitis: Secondary | ICD-10-CM

## 2022-08-03 DIAGNOSIS — Z0000 Encounter for general adult medical examination without abnormal findings: Secondary | ICD-10-CM

## 2022-08-03 DIAGNOSIS — Z86010 Personal history of colonic polyps: Secondary | ICD-10-CM

## 2022-08-03 DIAGNOSIS — E538 Deficiency of other specified B group vitamins: Secondary | ICD-10-CM

## 2022-08-03 DIAGNOSIS — N138 Other obstructive and reflux uropathy: Secondary | ICD-10-CM

## 2022-08-03 DIAGNOSIS — E1169 Type 2 diabetes mellitus with other specified complication: Secondary | ICD-10-CM

## 2022-08-03 DIAGNOSIS — I10 Essential (primary) hypertension: Secondary | ICD-10-CM

## 2022-08-03 DIAGNOSIS — Z79899 Other long term (current) drug therapy: Secondary | ICD-10-CM

## 2022-08-03 DIAGNOSIS — E559 Vitamin D deficiency, unspecified: Secondary | ICD-10-CM

## 2022-08-15 ENCOUNTER — Other Ambulatory Visit: Payer: Self-pay | Admitting: Internal Medicine

## 2022-08-15 ENCOUNTER — Encounter: Payer: Self-pay | Admitting: Internal Medicine

## 2022-08-15 DIAGNOSIS — N182 Chronic kidney disease, stage 2 (mild): Secondary | ICD-10-CM

## 2022-08-15 MED ORDER — TIRZEPATIDE 2.5 MG/0.5ML ~~LOC~~ SOAJ: SUBCUTANEOUS | 0 refills | Status: DC

## 2022-08-15 NOTE — Telephone Encounter (Signed)
Can you see if he was able to get Mounjaro approved-please advise pt Mark [name] is no longer here and to send to Dr. Posey Boyer I guess

## 2022-08-17 ENCOUNTER — Ambulatory Visit (INDEPENDENT_AMBULATORY_CARE_PROVIDER_SITE_OTHER): Payer: Medicare Other

## 2022-08-17 VITALS — BP 150/90 | HR 71 | Temp 98.3°F | Ht 72.5 in | Wt 224.0 lb

## 2022-08-17 DIAGNOSIS — E349 Endocrine disorder, unspecified: Secondary | ICD-10-CM | POA: Diagnosis not present

## 2022-08-17 MED ORDER — TESTOSTERONE CYPIONATE 200 MG/ML IM SOLN
200.0000 mg | Freq: Once | INTRAMUSCULAR | Status: AC
Start: 2022-08-17 — End: 2022-08-17
  Administered 2022-08-17: 200 mg via INTRAMUSCULAR

## 2022-08-17 NOTE — Progress Notes (Signed)
Patient presents to the office for a Testosterone Cypionate 200 mg/ml 2 ml IM injection in RIGHT outer quadrant. Patient tolerated well

## 2022-08-23 ENCOUNTER — Other Ambulatory Visit: Payer: Self-pay | Admitting: Nurse Practitioner

## 2022-08-23 DIAGNOSIS — N401 Enlarged prostate with lower urinary tract symptoms: Secondary | ICD-10-CM

## 2022-08-24 ENCOUNTER — Other Ambulatory Visit: Payer: Self-pay | Admitting: Internal Medicine

## 2022-08-24 MED ORDER — SEMAGLUTIDE (1 MG/DOSE) 4 MG/3ML ~~LOC~~ SOPN: PEN_INJECTOR | SUBCUTANEOUS | 0 refills | Status: DC

## 2022-08-29 ENCOUNTER — Other Ambulatory Visit: Payer: Self-pay | Admitting: Internal Medicine

## 2022-08-31 ENCOUNTER — Other Ambulatory Visit: Payer: Self-pay | Admitting: Internal Medicine

## 2022-08-31 DIAGNOSIS — N401 Enlarged prostate with lower urinary tract symptoms: Secondary | ICD-10-CM

## 2022-09-04 ENCOUNTER — Other Ambulatory Visit: Payer: Self-pay | Admitting: Nurse Practitioner

## 2022-09-04 ENCOUNTER — Encounter: Payer: Self-pay | Admitting: Nurse Practitioner

## 2022-09-04 DIAGNOSIS — I10 Essential (primary) hypertension: Secondary | ICD-10-CM

## 2022-09-04 DIAGNOSIS — E349 Endocrine disorder, unspecified: Secondary | ICD-10-CM

## 2022-09-04 MED ORDER — TELMISARTAN 20 MG PO TABS
ORAL_TABLET | ORAL | 0 refills | Status: DC
Start: 2022-09-04 — End: 2022-11-28

## 2022-09-04 NOTE — Telephone Encounter (Signed)
Can you please call this pt. I do not have telmisartan on his medication list.

## 2022-09-06 MED ORDER — TESTOSTERONE CYPIONATE 200 MG/ML IM SOLN
INTRAMUSCULAR | 0 refills | Status: AC
Start: 2022-09-06 — End: ?

## 2022-09-11 ENCOUNTER — Ambulatory Visit: Payer: Medicare Other

## 2022-09-14 ENCOUNTER — Ambulatory Visit: Payer: Medicare Other

## 2022-09-14 ENCOUNTER — Ambulatory Visit (INDEPENDENT_AMBULATORY_CARE_PROVIDER_SITE_OTHER): Payer: Medicare Other

## 2022-09-14 VITALS — BP 124/78 | HR 83 | Temp 97.5°F | Ht 72.5 in | Wt 222.2 lb

## 2022-09-14 DIAGNOSIS — E349 Endocrine disorder, unspecified: Secondary | ICD-10-CM

## 2022-09-14 MED ORDER — TESTOSTERONE CYPIONATE 200 MG/ML IM SOLN
200.0000 mg | Freq: Once | INTRAMUSCULAR | Status: AC
Start: 2022-09-14 — End: 2022-09-14
  Administered 2022-09-14: 200 mg via INTRAMUSCULAR

## 2022-09-14 NOTE — Progress Notes (Signed)
Patient presents to the office for a Testosterone Cypionate 200 mg/ml 2 ml IM injection in LEFT outer quadrant. Patient tolerated well  

## 2022-10-02 ENCOUNTER — Other Ambulatory Visit: Payer: Self-pay | Admitting: Internal Medicine

## 2022-10-09 ENCOUNTER — Other Ambulatory Visit: Payer: Self-pay | Admitting: Internal Medicine

## 2022-10-16 ENCOUNTER — Ambulatory Visit: Payer: Medicare Other | Admitting: Nurse Practitioner

## 2022-10-16 ENCOUNTER — Encounter: Payer: Self-pay | Admitting: Nurse Practitioner

## 2022-10-16 VITALS — BP 136/76 | HR 73 | Temp 97.5°F | Ht 72.5 in | Wt 221.0 lb

## 2022-10-16 DIAGNOSIS — E349 Endocrine disorder, unspecified: Secondary | ICD-10-CM | POA: Diagnosis not present

## 2022-10-16 DIAGNOSIS — L299 Pruritus, unspecified: Secondary | ICD-10-CM | POA: Diagnosis not present

## 2022-10-16 DIAGNOSIS — R21 Rash and other nonspecific skin eruption: Secondary | ICD-10-CM

## 2022-10-16 DIAGNOSIS — L2389 Allergic contact dermatitis due to other agents: Secondary | ICD-10-CM | POA: Diagnosis not present

## 2022-10-16 MED ORDER — TRIAMCINOLONE ACETONIDE 0.025 % EX OINT
1.0000 | TOPICAL_OINTMENT | Freq: Two times a day (BID) | CUTANEOUS | 0 refills | Status: DC
Start: 2022-10-16 — End: 2022-11-22

## 2022-10-16 MED ORDER — PREDNISONE 10 MG PO TABS
ORAL_TABLET | ORAL | 0 refills | Status: DC
Start: 2022-10-16 — End: 2022-11-22

## 2022-10-16 MED ORDER — TESTOSTERONE CYPIONATE 200 MG/ML IM SOLN
200.0000 mg | Freq: Once | INTRAMUSCULAR | Status: AC
Start: 2022-10-16 — End: 2022-10-16
  Administered 2022-10-16: 200 mg via INTRAMUSCULAR

## 2022-10-16 NOTE — Patient Instructions (Signed)
Rash, Adult  A rash is a breakout of spots or blotches on the skin. It can change the way your skin looks and feels. Many things can cause a rash. The goal of treatment is to stop the itching and keep the rash from spreading. Follow these instructions at home: Medicine Take or apply over-the-counter and prescription medicines only as told by your doctor. These may include medicines to treat: Red or swollen skin. Itching. An allergy. Pain. An infection.  Skin care Put a cool, wet cloth on the rash. Do not scratch or rub your skin. Try not to cover the rash. Keep it exposed to air as often as you can. Managing itching and discomfort Avoid hot showers or baths. These can make itching worse. A cold shower may help. Try taking a bath with: Epsom salts. You can get these at your pharmacy or grocery store. Follow the instructions on the package. Baking soda. Pour a small amount into the bath as told by your doctor. Colloidal oatmeal. You can get this at your pharmacy or grocery store. Follow the instructions on the package. Try putting baking soda paste on your skin. Stir water into baking soda until it gets like a paste. Try putting on a lotion to help with itching (calamine lotion). Keep cool. Stay out of the sun. Sweating and being hot can make itching worse. General instructions  Rest as needed. Drink enough fluid to keep your pee (urine) pale yellow. Wear loose-fitting clothes. Avoid scented soaps, detergents, and perfumes. Use gentle soaps, detergents, perfumes, and cosmetics. Avoid the things that cause your rash. Keep a journal to help keep track of what causes your rash. Write down: What you eat. What cosmetics you use. What you drink. What you wear. This includes jewelry. Contact a doctor if: You sweat a lot at night. You pee (urinate) more or less than normal. Your pee is a darker color than normal. Your eyes are sensitive to light. Your skin or the white parts of your  eyes turn yellow. Your skin tingles or is numb. You get painful blisters in your nose or mouth. Your rash does not go away after a few days, or it gets worse. You are more tired than normal. You are more thirsty than normal. You have new or worse symptoms. These may include: Pain in your belly. A fever. Watery poop (diarrhea). Vomiting. Weakness. Weight loss. Get help right away if: You start to feel mixed up (confused). You have a very bad headache or a stiff neck. You have very bad joint pain or stiffness. You get very sleepy or not responsive. You have a seizure. This information is not intended to replace advice given to you by your health care provider. Make sure you discuss any questions you have with your health care provider. Document Revised: 12/30/2021 Document Reviewed: 12/30/2021 Elsevier Patient Education  2024 [employer]  

## 2022-10-16 NOTE — Progress Notes (Signed)
Patient presents to the office for a Testosterone Cypionate 200 mg/ml 2 ml IM injection in RIGHT outer quadrant. Patient tolerated well

## 2022-10-16 NOTE — Progress Notes (Signed)
Assessment and Plan:  Mark [name] was seen today for an episodic visit.  Diagnoses and all order for this visit:  Allergic contact dermatitis due to other agents Unclear etiology of outbreak. Start Prednisone as directed - monitor BG Continue OTC antihistamine Benadryl Continue to monitor for no response to medications or spreading of rash Notify office if s/s fail to improve.  - predniSONE (DELTASONE) 10 MG tablet; 1 tab 3 x day for 2 days, then 1 tab 2 x day for 2 days, then 1 tab 1 x day for 3 days  Dispense: 13 tablet; Refill: 0 - triamcinolone (KENALOG) 0.025 % ointment; Apply 1 Application topically 2 (two) times daily.  Dispense: 30 g; Refill: 0  Rash and nonspecific skin eruption  - predniSONE (DELTASONE) 10 MG tablet; 1 tab 3 x day for 2 days, then 1 tab 2 x day for 2 days, then 1 tab 1 x day for 3 days  Dispense: 13 tablet; Refill: 0  Pruritus Apply topical triamcinolone cream to affected area. Continue to monitor  - triamcinolone (KENALOG) 0.025 % ointment; Apply 1 Application topically 2 (two) times daily.  Dispense: 30 g; Refill: 0  Testosterone deficiency Testosterone injection administered - patient tolerated well.  Meds ordered this encounter  Medications   testosterone cypionate (DEPOTESTOSTERONE CYPIONATE) injection 200 mg   predniSONE (DELTASONE) 10 MG tablet    Sig: 1 tab 3 x day for 2 days, then 1 tab 2 x day for 2 days, then 1 tab 1 x day for 3 days    Dispense:  13 tablet    Refill:  0    Order Specific Question:   Supervising Provider    Answer:   Lucky Cowboy [[id number]]   triamcinolone (KENALOG) 0.025 % ointment    Sig: Apply 1 Application topically 2 (two) times daily.    Dispense:  30 g    Refill:  0    Order Specific Question:   Supervising Provider    Answer:   Lucky Cowboy [phone]    Notify office for further evaluation and treatment, questions or concerns if s/s fail to improve. The risks and benefits of my recommendations, as well as  other treatment options were discussed with the patient today. Questions were answered.  Further disposition pending results of labs. Discussed med's effects and SE's.    Over 15 minutes of exam, counseling, chart review, and critical decision making was performed.   Future Appointments  Date Time Provider Department Center  08/03/2023  9:00 AM Raynelle Dick, NP [facility] None    ------------------------------------------------------------------------------------------------------------------   HPI BP 136/76   Pulse 73   Temp (!) 97.5 F (36.4 C)   Ht 6' 0.5" (1.842 m)   Wt 221 lb (100.2 kg)   SpO2 98%   BMI 29.56 kg/m   66 y.o.male presents for evaluation of rash on BLE ankles and back that presented 3 days ago, similar to possible chigger rash.  States the he was on a trip to [facility].  Concerned for bed bugs, however, not rash noted between webbing of feet and hands.  He has been taking Benadryl with some relief. Reports itching is worse symptoms.  Denies fever, chills, N/V or any recent viral infection.   Patient has hx of testosterone deficiency.  He receives testosterone injections in clinic.  Due for injection today.   Past Medical History:  Diagnosis Date   GERD (gastroesophageal reflux disease)    Hyperlipidemia    Hypertension  Other testicular hypofunction    Prediabetes    Vitamin D deficiency      Allergies  Allergen Reactions   Codeine     REACTION: Anxiety    Current Outpatient Medications on File Prior to Visit  Medication Sig   aspirin 81 MG chewable tablet Chew by mouth daily.   blood glucose meter kit and supplies KIT Dispense based on patient and insurance preference. Use up to four times daily as directed. (FOR ICD-9 250.00, 250.01).   Cholecalciferol 1000 units capsule Take 10,000 Units by mouth daily.   Cyanocobalamin (VITAMIN B-12 PO) Take by mouth daily.   finasteride (PROSCAR) 5 MG tablet TAKE 1 TABLET BY MOUTH ONCE DAILY FOR  PROSTATE   glipiZIDE (GLUCOTROL) 5 MG tablet Take  1/2 to 1 tablet   3 x /day  with Meals   for elevated blood sugars 150+.   glucose blood test strip Check blood glucose before meals & bedtime time and as needed.Check blood glucose before meals & bedtime time and as needed.   metFORMIN (GLUCOPHAGE-XR) 500 MG 24 hr tablet Take 1 tab daily with largest Meal for Diabetes.   OZEMPIC, 1 MG/DOSE, 4 MG/3ML SOPN INJECT 1 PEN (1 MG) INTO THE SKIN EVERY  7 DAYS FOR DIABETES   rosuvastatin (CRESTOR) 10 MG tablet Take  1 tablet  Daily for Cholesterol                                           /                     TAKE                  BY                    MOUTH                            ONCE DAILY   tamsulosin (FLOMAX) 0.4 MG CAPS capsule TAKE 1 CAPSULE BY MOUTH AT BEDTIME FOR PROSTATE   telmisartan (MICARDIS) 20 MG tablet Take 1 tablet Daily for BP   testosterone cypionate (DEPOTESTOSTERONE CYPIONATE) 200 MG/ML injection Inject 2 ml into muscle every 2 weeks   No current facility-administered medications on file prior to visit.    ROS: all negative except what is noted in the HPI.   Physical Exam:  BP 136/76   Pulse 73   Temp (!) 97.5 F (36.4 C)   Ht 6' 0.5" (1.842 m)   Wt 221 lb (100.2 kg)   SpO2 98%   BMI 29.56 kg/m   General Appearance: NAD.  Awake, conversant and cooperative. Eyes: PERRLA, EOMs intact.  Sclera white.  Conjunctiva without erythema. Sinuses: No frontal/maxillary tenderness.  No nasal discharge. Nares patent.  ENT/Mouth: Ext aud canals clear.  Bilateral TMs w/DOL and without erythema or bulging. Hearing intact.  Posterior pharynx without swelling or exudate.  Tonsils without swelling or erythema.  Neck: Supple.  No masses, nodules or thyromegaly. Respiratory: Effort is regular with non-labored breathing. Breath sounds are equal bilaterally without rales, rhonchi, wheezing or stridor.  Cardio: RRR with no MRGs. Brisk peripheral pulses without edema.  Abdomen: Active BS in  all four quadrants.  Soft and non-tender without guarding, rebound tenderness, hernias or masses. Lymphatics: Non tender without  lymphadenopathy.  Musculoskeletal: Full ROM, 5/5 strength, normal ambulation.  No clubbing or cyanosis. Skin: Scattered areas of round dry mildly erythematous flat macules along BLE ankles and lightly scattered around back.  Back area has a mild rash with very light pink flat macules.  Appropriate color for ethnicity.  Neuro: CN II-XII grossly normal. Normal muscle tone without cerebellar symptoms and intact sensation.   Psych: AO X 3,  appropriate mood and affect, insight and judgment.     Adela Glimpse, NP 2:38 PM [facility]

## 2022-10-19 ENCOUNTER — Encounter: Payer: Self-pay | Admitting: Nurse Practitioner

## 2022-11-07 ENCOUNTER — Ambulatory Visit (INDEPENDENT_AMBULATORY_CARE_PROVIDER_SITE_OTHER): Payer: Medicare Other

## 2022-11-07 VITALS — HR 74 | Temp 98.1°F | Ht 72.5 in | Wt 221.0 lb

## 2022-11-07 DIAGNOSIS — E349 Endocrine disorder, unspecified: Secondary | ICD-10-CM | POA: Diagnosis not present

## 2022-11-07 MED ORDER — TESTOSTERONE CYPIONATE 200 MG/ML IM SOLN
200.0000 mg | Freq: Once | INTRAMUSCULAR | Status: AC
Start: 2022-11-07 — End: 2022-11-07
  Administered 2022-11-07: 200 mg via INTRAMUSCULAR

## 2022-11-07 NOTE — Progress Notes (Signed)
Patient presents to the office for a Testosterone Cypionate 200 mg/ml 2 ml IM injection in LEFT outer quadrant. Patient tolerated well  

## 2022-11-09 ENCOUNTER — Telehealth: Payer: Self-pay

## 2022-11-09 DIAGNOSIS — E1122 Type 2 diabetes mellitus with diabetic chronic kidney disease: Secondary | ICD-10-CM

## 2022-11-09 MED ORDER — EMPAGLIFLOZIN 25 MG PO TABS
25.0000 mg | ORAL_TABLET | Freq: Every day | ORAL | 1 refills | Status: AC
Start: 2022-11-09 — End: ?

## 2022-11-09 NOTE — Addendum Note (Signed)
Addended by: Dionicio Stall on: 11/09/2022 12:11 PM   Modules accepted: Orders

## 2022-11-09 NOTE — Telephone Encounter (Signed)
Patient requesting a refill on the Jardiance, 25mg . Not currently active on his med list.  States that can't take the Ozempic, affects his vision, so wants to go back to taking the [place]. Ok to fill?

## 2022-11-19 ENCOUNTER — Other Ambulatory Visit: Payer: Self-pay | Admitting: Nurse Practitioner

## 2022-11-19 DIAGNOSIS — N401 Enlarged prostate with lower urinary tract symptoms: Secondary | ICD-10-CM

## 2022-11-20 NOTE — Telephone Encounter (Signed)
Please schedule pt an appointment this week to discuss meds

## 2022-11-21 NOTE — Progress Notes (Unsigned)
FOLLOW UP Assessment:   Mark [name] was seen today for medicare wellness and follow-up.  Diagnoses and all orders for this visit:  Essential hypertension Discussed restarting Olemesartan if BP remains >130/80. Discussed DASH (Dietary Approaches to Stop Hypertension) DASH diet is lower in sodium than a typical American diet. Cut back on foods that are high in saturated fat, cholesterol, and trans fats. Eat more whole-grain foods, fish, poultry, and nuts Remain active and exercise as tolerated daily.  Monitor BP at home-Call if greater than 130/80.  Check CMP/CBC  Type 2 diabetes mellitus with stage 2 chronic kidney disease, without long-term current use of insulin (HCC) Stop Jardiance. Start Rybelsus - sample provided.  Discussed Mounjaro if A1c does not respond to oral mediation  Education: Reviewed 'ABCs' of diabetes management  Discussed goals to be met and/or maintained include A1C (<7) Blood pressure (<130/80) Cholesterol (LDL <70) Continue Eye Exam yearly  Continue Dental Exam Q6 mo Discussed dietary recommendations Discussed Physical Activity recommendations Foot exam UTD Check A1C  Hyperlipidemia associated with type 2 diabetes mellitus (HCC) Discussed lifestyle modifications. Recommended diet heavy in fruits and veggies, omega 3's. Decrease consumption of animal meats, cheeses, and dairy products. Remain active and exercise as tolerated. Continue to monitor. Check lipids/TSH   CKD stage 2 due to type 2 diabetes mellitus (HCC) Discussed how what you eat and drink can aide in kidney protection. Stay well hydrated. Avoid high salt foods. Avoid NSAIDS. Keep BP and BG well controlled.   Take medications as prescribed. Remain active and exercise as tolerated daily. Maintain weight.  Continue to monitor. Check CMP/GFR/Microablumin   Gastroesophageal reflux disease, unspecified whether esophagitis present No suspected reflux complications  (Barret/stricture). Lifestyle modification:  wt loss, avoid meals 2-3h before bedtime. Consider eliminating food triggers:  chocolate, caffeine, EtOH, acid/spicy food.  BPH  Obesity (BMI 30.0-34.9) Discussed appropriate BMI Diet modification. Physical activity. Encouraged/praised to build confidence.   Testosterone deficiency Injection administered today. Continued perceived benefits with supplementation, no SE. Check level PRN, continue zinc, no symptoms of elevated estrogen or dihydrotestosterone.   Medication management All medications discussed and reviewed in full. All questions and concerns regarding medications addressed.      No orders of the defined types were placed in this encounter.  No orders of the defined types were placed in this encounter.    Over 20 minutes of exam, counseling, chart review, and critical decision making was performed  Future Appointments  Date Time Provider Department Center  11/22/2022  4:15 PM Raynelle Dick, NP [facility] None  08/03/2023  9:00 AM Raynelle Dick, NP [facility] None    Subjective:  Mark [name] is a 67 y.o. male who presents for general follow up for HTN, hyperlipidemia, T2DM, and vitamin D Def.   Overall he reports doing well.    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  Enjoying at this time.  Requesting to have TB test for school health requirement.  BMI is There is no height or weight on file to calculate BMI., he has been working on diet and exercise, goes to gym 45 min daily.  Wt Readings from Last 3 Encounters:  11/07/22 221 lb (100.2 kg)  10/16/22 221 lb (100.2 kg)  09/14/22 222 lb 3.2 oz (100.8 kg)   His blood pressure has been controlled at home, today their BP is    BP Readings from Last 3 Encounters:  10/16/22 136/76  09/14/22 124/78  08/17/22 (!) 150/90  He does workout. He denies chest pain, shortness of breath, dizziness.    He is on cholesterol medication  (rosuvastatin 10 mg daily) and denies myalgias. His cholesterol is not at goal. The cholesterol last visit was:   Lab Results  Component Value Date   CHOL 206 (H) 01/11/2022   HDL 43 01/11/2022   LDLCALC 126 (H) 01/11/2022   TRIG 228 (H) 01/11/2022   CHOLHDL 4.8 01/11/2022   He has been working on diet and exercise for T2DM with CKD II, on Metformin 2000 mg daily, glipizide 5 mg at night, jardiance 25 mg, unable to tolerate Jardiance.  Also on ASA and ARB and denies hyperglycemia, hypoglycemia , increased appetite, nausea, paresthesia of the feet, polydipsia, polyuria and visual disturbances.  Reports fasting typically around 120 range  Has never had an eye exam and refuses Last A1C in the office was:  Lab Results  Component Value Date   HGBA1C 9.7 (H) 01/11/2022   Last GFR Lab Results  Component Value Date   GFRNONAA 65 07/29/2020    Patient is on Vitamin D supplement, taking 5000 IU recently.  Lab Results  Component Value Date   VD25OH 23 (L) 11/02/2020     He has a history of testosterone deficiency and is on testosterone replacement.  He states that the testosterone helps with his energy, libido, muscle mass. Denies symptoms of elevated estrogen or dihydrotestosterone.  He feels very fatigued when his testosterone runs out. Lab Results  Component Value Date   TESTOSTERONE 316 11/02/2020   Also prescribed finasteride 5 mg, tamsulosin daily for BPH, reports doing well.   Medication Review:  Current Outpatient Medications (Endocrine & Metabolic):    empagliflozin (JARDIANCE) 25 MG TABS tablet, Take 1 tablet (25 mg total) by mouth daily.   glipiZIDE (GLUCOTROL) 5 MG tablet, Take  1/2 to 1 tablet   3 x /day  with Meals   for elevated blood sugars 150+.   metFORMIN (GLUCOPHAGE-XR) 500 MG 24 hr tablet, Take 1 tab daily with largest Meal for Diabetes.   predniSONE (DELTASONE) 10 MG tablet, 1 tab 3 x day for 2 days, then 1 tab 2 x day for 2 days, then 1 tab 1 x day for 3 days    testosterone cypionate (DEPOTESTOSTERONE CYPIONATE) 200 MG/ML injection, Inject 2 ml into muscle every 2 weeks  Current Outpatient Medications (Cardiovascular):    rosuvastatin (CRESTOR) 10 MG tablet, Take  1 tablet  Daily for Cholesterol                                           /                     TAKE                  BY                    MOUTH                            ONCE DAILY   telmisartan (MICARDIS) 20 MG tablet, Take 1 tablet Daily for BP   Current Outpatient Medications (Analgesics):    aspirin 81 MG chewable tablet, Chew by mouth daily.  Current Outpatient Medications (Hematological):    Cyanocobalamin (VITAMIN B-12 PO), Take by mouth  daily.  Current Outpatient Medications (Other):    blood glucose meter kit and supplies KIT, Dispense based on patient and insurance preference. Use up to four times daily as directed. (FOR ICD-9 250.00, 250.01).   Cholecalciferol 1000 units capsule, Take 10,000 Units by mouth daily.   finasteride (PROSCAR) 5 MG tablet, TAKE 1 TABLET BY MOUTH ONCE DAILY FOR PROSTATE   glucose blood test strip, Check blood glucose before meals & bedtime time and as needed.Check blood glucose before meals & bedtime time and as needed.   tamsulosin (FLOMAX) 0.4 MG CAPS capsule, TAKE 1 CAPSULE BY MOUTH AT BEDTIME FOR PROSTATE   triamcinolone (KENALOG) 0.025 % ointment, Apply 1 Application topically 2 (two) times daily.   Allergies: Allergies  Allergen Reactions   Codeine     REACTION: Anxiety    Current Problems (verified) has Hyperlipidemia associated with type 2 diabetes mellitus (HCC); Hypertension; Type 2 diabetes mellitus (HCC); GERD (gastroesophageal reflux disease); Vitamin D deficiency; Testosterone deficiency; Obesity (BMI 30.0-34.9); Medication management; Benign prostatic hyperplasia with urinary frequency; CKD stage 2 due to type 2 diabetes mellitus (HCC); B12 deficiency; History of colon polyps; and COVID-19 (03/29/2020) on their problem  list.  Screening Tests Immunization History  Administered Date(s) Administered   Td 03/27/2005   Tdap 08/09/2015    Names of Other Physician/Practitioners you currently use: 1. [facility] here for primary care 2. [facility], gets checked annually at DOT physical, last 2021, request report for diabetes eye  3. Dr. Gerilyn Pilgrim, dentist, last visit 2021, looking for new dentist  Patient Care Team: Lucky Cowboy, MD as PCP - General (Internal Medicine)  Surgical: He  has a past surgical history that includes Vasectomy (01/2004) and Urethral dilation (2007). Family His family history includes COPD in his father; Heart disease in his father; Hyperlipidemia in his mother; Hypertension in his father and mother. Social history  He reports that he has never smoked. He has never used smokeless tobacco. He reports that he does not drink alcohol and does not use drugs.   Objective:   There were no vitals filed for this visit.    There is no height or weight on file to calculate BMI.  General appearance: alert, no distress, WD/WN, male HEENT: normocephalic, sclerae anicteric, TMs pearly, nares patent, no discharge or erythema, pharynx normal Oral cavity: MMM, no lesions Neck: supple, no lymphadenopathy, no thyromegaly, no masses Heart: RRR, normal S1, S2, no murmurs Lungs: CTA bilaterally, no wheezes, rhonchi, or rales Abdomen: +bs, soft, non tender, non distended, no masses, no hepatomegaly, no splenomegaly Musculoskeletal: nontender, no swelling, no obvious deformity Extremities: no edema, no cyanosis, no clubbing Pulses: 2+ symmetric, upper and lower extremities, normal cap refill Neurological: alert, oriented x 3, CN2-12 intact, strength normal upper extremities and lower extremities, sensation normal throughout, DTRs 2+ throughout, no cerebellar signs, gait normal Psychiatric: normal affect, behavior normal, pleasant     Raynelle Dick, NP   11/21/2022

## 2022-11-22 ENCOUNTER — Ambulatory Visit (INDEPENDENT_AMBULATORY_CARE_PROVIDER_SITE_OTHER): Payer: Medicare Other | Admitting: Nurse Practitioner

## 2022-11-22 ENCOUNTER — Encounter: Payer: Self-pay | Admitting: Nurse Practitioner

## 2022-11-22 VITALS — BP 128/78 | HR 86 | Temp 97.7°F | Ht 72.5 in | Wt 223.4 lb

## 2022-11-22 DIAGNOSIS — E669 Obesity, unspecified: Secondary | ICD-10-CM

## 2022-11-22 DIAGNOSIS — E1169 Type 2 diabetes mellitus with other specified complication: Secondary | ICD-10-CM | POA: Diagnosis not present

## 2022-11-22 DIAGNOSIS — I10 Essential (primary) hypertension: Secondary | ICD-10-CM | POA: Diagnosis not present

## 2022-11-22 DIAGNOSIS — E559 Vitamin D deficiency, unspecified: Secondary | ICD-10-CM

## 2022-11-22 DIAGNOSIS — N182 Chronic kidney disease, stage 2 (mild): Secondary | ICD-10-CM | POA: Diagnosis not present

## 2022-11-22 DIAGNOSIS — E66811 Obesity, class 1: Secondary | ICD-10-CM

## 2022-11-22 DIAGNOSIS — E291 Testicular hypofunction: Secondary | ICD-10-CM

## 2022-11-22 DIAGNOSIS — Z79899 Other long term (current) drug therapy: Secondary | ICD-10-CM

## 2022-11-22 DIAGNOSIS — K219 Gastro-esophageal reflux disease without esophagitis: Secondary | ICD-10-CM | POA: Diagnosis not present

## 2022-11-22 DIAGNOSIS — R350 Frequency of micturition: Secondary | ICD-10-CM

## 2022-11-22 DIAGNOSIS — E785 Hyperlipidemia, unspecified: Secondary | ICD-10-CM

## 2022-11-22 DIAGNOSIS — E1122 Type 2 diabetes mellitus with diabetic chronic kidney disease: Secondary | ICD-10-CM | POA: Diagnosis not present

## 2022-11-22 DIAGNOSIS — N401 Enlarged prostate with lower urinary tract symptoms: Secondary | ICD-10-CM

## 2022-11-22 MED ORDER — TIRZEPATIDE 2.5 MG/0.5ML ~~LOC~~ SOAJ
2.5000 mg | SUBCUTANEOUS | 1 refills | Status: DC
Start: 2022-11-22 — End: 2023-02-27

## 2022-11-22 MED ORDER — TADALAFIL 5 MG PO TABS
ORAL_TABLET | ORAL | 3 refills | Status: DC
Start: 2022-11-22 — End: 2023-05-08

## 2022-11-22 NOTE — Patient Instructions (Signed)
START MOUNJARO 2.5 MG SQ QW- MONITOR FOR EYE SYMPTOMS  MAKE EYE APPOINTMENT WITH OPHTHALMOLOGIST  USE GLIPIZIDE 5 MG 1/2 TAB TWICE A DAY WITH MEALS IF BLOOD SUGAR IS GREATER THAN 150  CHECK BLOOD SUGAR TWICE A DAY- FASTING AND BEFORE SUPPER  Diabetes Mellitus and Nutrition, Adult When you have diabetes, or diabetes mellitus, it is very important to have healthy eating habits because your blood sugar (glucose) levels are greatly affected by what you eat and drink. Eating healthy foods in the right amounts, at about the same times every day, can help you: Manage your blood glucose. Lower your risk of heart disease. Improve your blood pressure. Reach or maintain a healthy weight. What can affect my meal plan? Every person with diabetes is different, and each person has different needs for a meal plan. Your health care provider may recommend that you work with a dietitian to make a meal plan that is best for you. Your meal plan may vary depending on factors such as: The calories you need. The medicines you take. Your weight. Your blood glucose, blood pressure, and cholesterol levels. Your activity level. Other health conditions you have, such as heart or kidney disease. How do carbohydrates affect me? Carbohydrates, also called carbs, affect your blood glucose level more than any other type of food. Eating carbs raises the amount of glucose in your blood. It is important to know how many carbs you can safely have in each meal. This is different for every person. Your dietitian can help you calculate how many carbs you should have at each meal and for each snack. How does alcohol affect me? Alcohol can cause a decrease in blood glucose (hypoglycemia), especially if you use insulin or take certain diabetes medicines by mouth. Hypoglycemia can be a life-threatening condition. Symptoms of hypoglycemia, such as sleepiness, dizziness, and confusion, are similar to symptoms of having too much  alcohol. Do not drink alcohol if: Your health care provider tells you not to drink. You are pregnant, may be pregnant, or are planning to become pregnant. If you drink alcohol: Limit how much you have to: 0-1 drink a day for women. 0-2 drinks a day for men. Know how much alcohol is in your drink. In the [place] one drink equals one 12 oz bottle of beer (355 mL), one 5 oz glass of wine (148 mL), or one 1 oz glass of hard liquor (44 mL). Keep yourself hydrated with water, diet soda, or unsweetened iced tea. Keep in mind that regular soda, juice, and other mixers may contain a lot of sugar and must be counted as carbs. What are tips for following this plan?  Reading food labels Start by checking the serving size on the Nutrition Facts label of packaged foods and drinks. The number of calories and the amount of carbs, fats, and other nutrients listed on the label are based on one serving of the item. Many items contain more than one serving per package. Check the total grams (g) of carbs in one serving. Check the number of grams of saturated fats and trans fats in one serving. Choose foods that have a low amount or none of these fats. Check the number of milligrams (mg) of salt (sodium) in one serving. Most people should limit total sodium intake to less than 2,300 mg per day. Always check the nutrition information of foods labeled as "low-fat" or "nonfat." These foods may be higher in added sugar or refined carbs and should be avoided. Talk  to your dietitian to identify your daily goals for nutrients listed on the label. Shopping Avoid buying canned, pre-made, or processed foods. These foods tend to be high in fat, sodium, and added sugar. Shop around the outside edge of the grocery store. This is where you will most often find fresh fruits and vegetables, bulk grains, fresh meats, and fresh dairy products. Cooking Use low-heat cooking methods, such as baking, instead of high-heat cooking methods,  such as deep frying. Cook using healthy oils, such as olive, canola, or sunflower oil. Avoid cooking with butter, cream, or high-fat meats. Meal planning Eat meals and snacks regularly, preferably at the same times every day. Avoid going long periods of time without eating. Eat foods that are high in fiber, such as fresh fruits, vegetables, beans, and whole grains. Eat 4-6 oz (112-168 g) of lean protein each day, such as lean meat, chicken, fish, eggs, or tofu. One ounce (oz) (28 g) of lean protein is equal to: 1 oz (28 g) of meat, chicken, or fish. 1 egg.  cup (62 g) of tofu. Eat some foods each day that contain healthy fats, such as avocado, nuts, seeds, and fish. What foods should I eat? Fruits Berries. Apples. Oranges. Peaches. Apricots. Plums. Grapes. Mangoes. Papayas. Pomegranates. Kiwi. Cherries. Vegetables Leafy greens, including lettuce, spinach, kale, chard, collard greens, mustard greens, and cabbage. Beets. Cauliflower. Broccoli. Carrots. Green beans. Tomatoes. Peppers. Onions. Cucumbers. Brussels sprouts. Grains Whole grains, such as whole-wheat or whole-grain bread, crackers, tortillas, cereal, and pasta. Unsweetened oatmeal. Quinoa. Brown or wild rice. Meats and other proteins Seafood. Poultry without skin. Lean cuts of poultry and beef. Tofu. Nuts. Seeds. Dairy Low-fat or fat-free dairy products such as milk, yogurt, and cheese. The items listed above may not be a complete list of foods and beverages you can eat and drink. Contact a dietitian for more information. What foods should I avoid? Fruits Fruits canned with syrup. Vegetables Canned vegetables. Frozen vegetables with butter or cream sauce. Grains Refined white flour and flour products such as bread, pasta, snack foods, and cereals. Avoid all processed foods. Meats and other proteins Fatty cuts of meat. Poultry with skin. Breaded or fried meats. Processed meat. Avoid saturated fats. Dairy Full-fat yogurt,  cheese, or milk. Beverages Sweetened drinks, such as soda or iced tea. The items listed above may not be a complete list of foods and beverages you should avoid. Contact a dietitian for more information. Questions to ask a health care provider Do I need to meet with a certified diabetes care and education specialist? Do I need to meet with a dietitian? What number can I call if I have questions? When are the best times to check my blood glucose? Where to find more information: American Diabetes Association: diabetes.org Academy of Nutrition and Dietetics: eatright.[employer] of Diabetes and Digestive and Kidney Diseases: StageSync.si Association of Diabetes Care & Education Specialists: diabeteseducator.org Summary It is important to have healthy eating habits because your blood sugar (glucose) levels are greatly affected by what you eat and drink. It is important to use alcohol carefully. A healthy meal plan will help you manage your blood glucose and lower your risk of heart disease. Your health care provider may recommend that you work with a dietitian to make a meal plan that is best for you. This information is not intended to replace advice given to you by your health care provider. Make sure you discuss any questions you have with your health care provider. Document Revised:  10/15/2019 Document Reviewed: 10/15/2019 Elsevier Patient Education  2024 [employer].

## 2022-11-23 ENCOUNTER — Other Ambulatory Visit: Payer: Self-pay | Admitting: Nurse Practitioner

## 2022-11-23 ENCOUNTER — Telehealth: Payer: Self-pay

## 2022-11-23 DIAGNOSIS — E1169 Type 2 diabetes mellitus with other specified complication: Secondary | ICD-10-CM

## 2022-11-23 LAB — LIPID PANEL
Cholesterol: 247 mg/dL — ABNORMAL HIGH (ref <200)
HDL: 40 mg/dL (ref >=40)
Non-HDL Cholesterol (Calc): 207 mg/dL — ABNORMAL HIGH (ref <130)
Total CHOL/HDL Ratio: 6.2 (calc) — ABNORMAL HIGH (ref <5.0)
Triglycerides: 482 mg/dL — ABNORMAL HIGH (ref <150)

## 2022-11-23 LAB — CBC WITH DIFFERENTIAL/PLATELET
Absolute Monocytes: 570 {cells}/uL (ref 200–950)
Basophils Absolute: 59 {cells}/uL (ref 0–200)
Basophils Relative: 0.8 %
Eosinophils Absolute: 207 {cells}/uL (ref 15–500)
Eosinophils Relative: 2.8 %
HCT: 46.5 % (ref 38.5–50.0)
Hemoglobin: 15.5 g/dL (ref 13.2–17.1)
Lymphs Abs: 2190 {cells}/uL (ref 850–3900)
MCH: 29.6 pg (ref 27.0–33.0)
MCHC: 33.3 g/dL (ref 32.0–36.0)
MCV: 88.9 fL (ref 80.0–100.0)
MPV: 10.7 fL (ref 7.5–12.5)
Monocytes Relative: 7.7 %
Neutro Abs: 4373 {cells}/uL (ref 1500–7800)
Neutrophils Relative %: 59.1 %
Platelets: 242 10*3/uL (ref 140–400)
RBC: 5.23 10*6/uL (ref 4.20–5.80)
RDW: 12.9 % (ref 11.0–15.0)
Total Lymphocyte: 29.6 %
WBC: 7.4 10*3/uL (ref 3.8–10.8)

## 2022-11-23 LAB — COMPLETE METABOLIC PANEL WITH GFR
AG Ratio: 1.6 (calc) (ref 1.0–2.5)
ALT: 26 U/L (ref 9–46)
AST: 17 U/L (ref 10–35)
Albumin: 4.6 g/dL (ref 3.6–5.1)
Alkaline phosphatase (APISO): 106 U/L (ref 35–144)
BUN: 20 mg/dL (ref 7–25)
CO2: 27 mmol/L (ref 20–32)
Calcium: 9.6 mg/dL (ref 8.6–10.3)
Chloride: 102 mmol/L (ref 98–110)
Creat: 1.09 mg/dL (ref 0.70–1.35)
Globulin: 2.8 g/dL (ref 1.9–3.7)
Glucose, Bld: 215 mg/dL — ABNORMAL HIGH (ref 65–99)
Potassium: 4.3 mmol/L (ref 3.5–5.3)
Sodium: 137 mmol/L (ref 135–146)
Total Bilirubin: 0.3 mg/dL (ref 0.2–1.2)
Total Protein: 7.4 g/dL (ref 6.1–8.1)
eGFR: 74 mL/min/{1.73_m2} (ref >=60)

## 2022-11-23 LAB — URINALYSIS, ROUTINE W REFLEX MICROSCOPIC
Bilirubin Urine: NEGATIVE
Hgb urine dipstick: NEGATIVE
Ketones, ur: NEGATIVE
Leukocytes,Ua: NEGATIVE
Nitrite: NEGATIVE
Protein, ur: NEGATIVE
Specific Gravity, Urine: 1.043 — ABNORMAL HIGH (ref 1.001–1.035)
pH: 6.5 (ref 5.0–8.0)

## 2022-11-23 LAB — MICROALBUMIN / CREATININE URINE RATIO
Creatinine, Urine: 57 mg/dL (ref 20–320)
Microalb, Ur: <0.2 mg/dL

## 2022-11-23 LAB — HEMOGLOBIN A1C W/OUT EAG: Hgb A1c MFr Bld: 8.7 %{Hb} — ABNORMAL HIGH (ref <5.7)

## 2022-11-23 LAB — TSH: TSH: 1.7 mIU/L (ref 0.40–4.50)

## 2022-11-23 LAB — MAGNESIUM: Magnesium: 2.5 mg/dL (ref 1.5–2.5)

## 2022-11-23 MED ORDER — ROSUVASTATIN CALCIUM 20 MG PO TABS
20.0000 mg | ORAL_TABLET | Freq: Every day | ORAL | 3 refills | Status: AC
Start: 2022-11-23 — End: 2023-11-23

## 2022-11-23 NOTE — Telephone Encounter (Signed)
Will you check into this

## 2022-11-23 NOTE — Telephone Encounter (Signed)
Prior auth completed and submitted. 

## 2022-11-23 NOTE — Telephone Encounter (Signed)
 Mounjar[employer] prior auth completed and submitted.

## 2022-11-24 ENCOUNTER — Telehealth: Payer: Self-pay | Admitting: Nurse Practitioner

## 2022-11-24 NOTE — Telephone Encounter (Signed)
Patient advised.

## 2022-11-24 NOTE — Telephone Encounter (Signed)
Please call pt and advise about the cialis but I think he was going to do good rx

## 2022-11-24 NOTE — Telephone Encounter (Signed)
Denial for Tadalafil 5 MG b/c it is non formulary. Pt needs to try 3 alternatives before. Mark [name] was approved and has been effective since 11/23/22-11/23/23. All this info should be faxed over by [facility].

## 2022-11-27 ENCOUNTER — Other Ambulatory Visit: Payer: Self-pay | Admitting: Nurse Practitioner

## 2022-11-27 DIAGNOSIS — I10 Essential (primary) hypertension: Secondary | ICD-10-CM

## 2022-11-28 ENCOUNTER — Other Ambulatory Visit: Payer: Self-pay | Admitting: Nurse Practitioner

## 2022-11-28 DIAGNOSIS — N401 Enlarged prostate with lower urinary tract symptoms: Secondary | ICD-10-CM

## 2022-11-29 NOTE — Telephone Encounter (Signed)
Prior auth denied. 

## 2022-12-20 ENCOUNTER — Ambulatory Visit (INDEPENDENT_AMBULATORY_CARE_PROVIDER_SITE_OTHER): Payer: Medicare Other

## 2022-12-20 VITALS — BP 122/80 | HR 70 | Temp 98.0°F | Ht 72.5 in | Wt 219.0 lb

## 2022-12-20 DIAGNOSIS — E291 Testicular hypofunction: Secondary | ICD-10-CM

## 2022-12-20 MED ORDER — TESTOSTERONE CYPIONATE 200 MG/ML IM SOLN
200.0000 mg | Freq: Once | INTRAMUSCULAR | Status: AC
Start: 2022-12-20 — End: 2022-12-20
  Administered 2022-12-20: 200 mg via INTRAMUSCULAR

## 2022-12-20 NOTE — Progress Notes (Signed)
Patient presents to the office for a Testosterone Cypionate 100mg /ml 1 ml IM injection in LEFT outer quadrant. Patient tolerated well

## 2022-12-30 ENCOUNTER — Other Ambulatory Visit: Payer: Self-pay | Admitting: Internal Medicine

## 2022-12-30 MED ORDER — SULFAMETHOXAZOLE-TRIMETHOPRIM 800-160 MG PO TABS: ORAL_TABLET | ORAL | 0 refills | Status: DC

## 2022-12-30 MED ORDER — TAMSULOSIN HCL 0.4 MG PO CAPS: ORAL_CAPSULE | ORAL | 0 refills | Status: DC

## 2023-01-11 ENCOUNTER — Ambulatory Visit (INDEPENDENT_AMBULATORY_CARE_PROVIDER_SITE_OTHER): Payer: Medicare Other | Admitting: Nurse Practitioner

## 2023-01-11 ENCOUNTER — Ambulatory Visit: Payer: Medicare Other | Admitting: Nurse Practitioner

## 2023-01-11 VITALS — BP 136/80 | HR 76 | Temp 98.6°F | Ht 72.5 in | Wt 220.0 lb

## 2023-01-11 DIAGNOSIS — R350 Frequency of micturition: Secondary | ICD-10-CM

## 2023-01-11 DIAGNOSIS — R310 Gross hematuria: Secondary | ICD-10-CM

## 2023-01-11 DIAGNOSIS — E291 Testicular hypofunction: Secondary | ICD-10-CM

## 2023-01-11 DIAGNOSIS — N182 Chronic kidney disease, stage 2 (mild): Secondary | ICD-10-CM

## 2023-01-11 DIAGNOSIS — R3121 Asymptomatic microscopic hematuria: Secondary | ICD-10-CM | POA: Diagnosis not present

## 2023-01-11 DIAGNOSIS — N401 Enlarged prostate with lower urinary tract symptoms: Secondary | ICD-10-CM | POA: Diagnosis not present

## 2023-01-11 DIAGNOSIS — E1122 Type 2 diabetes mellitus with diabetic chronic kidney disease: Secondary | ICD-10-CM

## 2023-01-11 NOTE — Progress Notes (Signed)
FOLLOW UP Assessment:   [name] was seen today for medicare wellness and follow-up.  Diagnoses and all orders for this visit:  Gross hematuria Start tmt with abx Ciprofloxacin Possible nephrolithiasis  however no other associated symptoms reported Further diagnostic work up if s/s fail to improve.  - CBC with Differential/Platelet - COMPLETE METABOLIC PANEL WITH GFR - Urinalysis, Routine w reflex microscopic - Urine Culture  Benign prostatic hyperplasia with urinary frequency Check PSA  Considerably declines seeing Urology however, may need referral pending underlying gross hematuria  Testosterone deficiency in male Refrain from TRT injections at this time.  Type 2 diabetes mellitus with stage 2 chronic kidney disease, without long-term current use of insulin (HCC) A1c not at goal. Keep BG well controlled  Continue ACE, bASA, statin Stay well hydrated. Avoid high salt foods. Avoid NSAIDS. Keep BP and BG well controlled.   Take medications as prescribed.  Orders Placed This Encounter  Procedures   Urine Culture   MICROSCOPIC MESSAGE   CBC with Differential/Platelet   COMPLETE METABOLIC PANEL WITH GFR   Urinalysis, Routine w reflex microscopic   TEST AUTHORIZATION   PSA     Notify office for further evaluation and treatment, questions or concerns if any reported s/s fail to improve.   The patient was advised to call back or seek an in-person evaluation if any symptoms worsen or if the condition fails to improve as anticipated.   Further disposition pending results of labs. Discussed med's effects and SE's.    I discussed the assessment and treatment plan with the patient. The patient was provided an opportunity to ask questions and all were answered. The patient agreed with the plan and demonstrated an understanding of the instructions.  Discussed med's effects and SE's. Screening labs and tests as requested with regular follow-up as recommended.  I provided 20  minutes of face-to-face time during this encounter including counseling, chart review, and critical decision making was preformed.  Today's Plan of Care is based on a patient-centered health care approach known as shared decision making - the decisions, tests and treatments allow for patient preferences and values to be balanced with clinical evidence.     Future Appointments  Date Time Provider Department Center  02/27/2023  4:15 PM Raynelle Dick, NP [facility] None  08/03/2023  9:00 AM Raynelle Dick, NP [facility] None    Subjective:  Mark [name] is a 67 y.o. male who presents for complaints of gross hematuria onset this am but has stopped.    He is currently on TRT depotestosterone cypionate 2 ml every 2 weeks and has a hx of BPH, taking tadalafil and tamsulosin.  He does not follow Urology.  He denies dysuria, urinary frequency or  urgency.  He does report having nephrolithiasis when he was younger and feels this may be a contributor however, denies flank pain, back pain, groin pain, fever, chills, N/V.  He has a history of testosterone deficiency and is on testosterone replacement.  He states that the testosterone helps with his energy, libido, muscle mass. Denies symptoms of elevated estrogen or dihydrotestosterone.  He feels very fatigued when his testosterone runs out. Lab Results  Component Value Date   TESTOSTERONE 316 11/02/2020    He has been working on diet and exercise for T2DM with CKD II, on Metformin 500 mg 1 tab daily with biggest meal , Jardiance 25 mg every day and Glipizide 5 mg 1/2 tab bid for blood sugar greater than 150- has not been  taking  Also on ASA and ARB and denies hyperglycemia, hypoglycemia , increased appetite, nausea, paresthesia of the feet, polydipsia, polyuria and visual disturbances.  Reports fasting typically around 120 range  Has never had an eye exam- he has refused. Last A1C in the office was:  Lab Results  Component Value Date   HGBA1C  8.7 (H) 11/22/2022   Last GFR Lab Results  Component Value Date   EGFR 61 01/11/2023   Medication Review:  Current Outpatient Medications (Endocrine & Metabolic):    empagliflozin (JARDIANCE) 25 MG TABS tablet, Take 1 tablet (25 mg total) by mouth daily.   glipiZIDE (GLUCOTROL) 5 MG tablet, Take  1/2 to 1 tablet   3 x /day  with Meals   for elevated blood sugars 150+. (Patient not taking: Reported on 11/22/2022)   metFORMIN (GLUCOPHAGE-XR) 500 MG 24 hr tablet, Take 1 tab daily with largest Meal for Diabetes.   testosterone cypionate (DEPOTESTOSTERONE CYPIONATE) 200 MG/ML injection, Inject 2 ml into muscle every 2 weeks   tirzepatide [facility]) 2.5 MG/0.5ML Pen, Inject 2.5 mg into the skin once a week.  Current Outpatient Medications (Cardiovascular):    rosuvastatin (CRESTOR) 20 MG tablet, Take 1 tablet (20 mg total) by mouth daily.   tadalafil (CIALIS) 5 MG tablet, TAKE 1 TAB DAILY FOR PROSTATE.   telmisartan (MICARDIS) 20 MG tablet, Take 1 tablet by mouth once daily for blood pressure   Current Outpatient Medications (Analgesics):    aspirin 81 MG chewable tablet, Chew by mouth daily.  Current Outpatient Medications (Hematological):    Cyanocobalamin (VITAMIN B-12 PO), Take by mouth daily.  Current Outpatient Medications (Other):    ciprofloxacin (CIPRO) 250 MG tablet, Take 1 tablet 2 x /day with Food for Infection   blood glucose meter kit and supplies KIT, Dispense based on patient and insurance preference. Use up to four times daily as directed. (FOR ICD-9 250.00, 250.01).   Cholecalciferol 1000 units capsule, Take 10,000 Units by mouth daily.   glucose blood test strip, Check blood glucose before meals & bedtime time and as needed.Check blood glucose before meals & bedtime time and as needed.   sulfamethoxazole-trimethoprim (BACTRIM DS) 800-160 MG tablet, Take 1 tablet 2 x / day with Food for  Prostate Infection   tamsulosin (FLOMAX) 0.4 MG CAPS capsule, Take  1 capsule at  Bedtime  for Prostate   Allergies: Allergies  Allergen Reactions   Codeine     REACTION: Anxiety    Current Problems (verified) has Hyperlipidemia associated with type 2 diabetes mellitus (HCC); Hypertension; Type 2 diabetes mellitus (HCC); GERD (gastroesophageal reflux disease); Vitamin D deficiency; Testosterone deficiency; Obesity (BMI 30.0-34.9); Medication management; Benign prostatic hyperplasia with urinary frequency; CKD stage 2 due to type 2 diabetes mellitus (HCC); B12 deficiency; History of colon polyps; and COVID-19 (03/29/2020) on their problem list.  Screening Tests Immunization History  Administered Date(s) Administered   Td 03/27/2005   Tdap 08/09/2015    Patient Care Team: Lucky Cowboy, MD as PCP - General (Internal Medicine)  Surgical: He  has a past surgical history that includes Vasectomy (01/2004) and Urethral dilation (2007). Family His family history includes COPD in his father; Heart disease in his father; Hyperlipidemia in his mother; Hypertension in his father and mother. Social history  He reports that he has never smoked. He has never used smokeless tobacco. He reports that he does not drink alcohol and does not use drugs.   Objective:   Today's Vitals   01/11/23 1527  BP:  136/80  Pulse: 76  Temp: 98.6 F (37 C)  SpO2: 99%  Weight: 220 lb (99.8 kg)  Height: 6' 0.5" (1.842 m)     Review of Systems  Constitutional:  Negative for chills, fever and weight loss.  HENT:  Negative for congestion and hearing loss.   Eyes:  Negative for blurred vision and double vision.  Respiratory:  Negative for cough and shortness of breath.   Cardiovascular:  Negative for chest pain, palpitations, orthopnea and leg swelling.  Gastrointestinal:  Negative for abdominal pain, constipation, diarrhea, heartburn, nausea and vomiting.  Genitourinary:  Frequency: gets up 4-5 times a night.  Musculoskeletal:  Negative for falls, joint pain and myalgias.  Skin:   Negative for rash.  Neurological:  Negative for dizziness, tingling, tremors, loss of consciousness and headaches.  Psychiatric/Behavioral:  Negative for depression, memory loss and suicidal ideas.      Body mass index is 29.43 kg/m.  General appearance: alert, no distress, WD/WN, male HEENT: normocephalic, sclerae anicteric, TMs pearly, nares patent, no discharge or erythema, pharynx normal Oral cavity: MMM, no lesions Neck: supple, no lymphadenopathy, no thyromegaly, no masses Heart: RRR, normal S1, S2, no murmurs Lungs: CTA bilaterally, no wheezes, rhonchi, or rales Abdomen: +bs, soft, non tender, non distended, no masses, no hepatomegaly, no splenomegaly Musculoskeletal: nontender, no swelling, no obvious deformity Extremities: no edema, no cyanosis, no clubbing Pulses: 2+ symmetric, upper and lower extremities, normal cap refill Neurological: alert, oriented x 3, CN2-12 intact, strength normal upper extremities and lower extremities, sensation normal throughout, DTRs 2+ throughout, no cerebellar signs, gait normal Psychiatric: normal affect, behavior normal, pleasant     Riah Kehoe, NP   01/16/2023

## 2023-01-12 ENCOUNTER — Ambulatory Visit: Payer: Medicare Other | Admitting: Nurse Practitioner

## 2023-01-12 ENCOUNTER — Other Ambulatory Visit: Payer: Self-pay | Admitting: Nurse Practitioner

## 2023-01-12 DIAGNOSIS — R310 Gross hematuria: Secondary | ICD-10-CM

## 2023-01-12 MED ORDER — CIPROFLOXACIN HCL 250 MG PO TABS
ORAL_TABLET | ORAL | 0 refills | Status: DC
Start: 2023-01-12 — End: 2023-02-27

## 2023-01-13 LAB — CBC WITH DIFFERENTIAL/PLATELET
Absolute Lymphocytes: 2023 {cells}/uL (ref 850–3900)
Absolute Monocytes: 633 {cells}/uL (ref 200–950)
Basophils Absolute: 45 {cells}/uL (ref 0–200)
Basophils Relative: 0.4 %
Eosinophils Absolute: 215 {cells}/uL (ref 15–500)
Eosinophils Relative: 1.9 %
HCT: 48.2 % (ref 38.5–50.0)
Hemoglobin: 15.5 g/dL (ref 13.2–17.1)
MCH: 28.6 pg (ref 27.0–33.0)
MCHC: 32.2 g/dL (ref 32.0–36.0)
MCV: 88.9 fL (ref 80.0–100.0)
MPV: 10.9 fL (ref 7.5–12.5)
Monocytes Relative: 5.6 %
Neutro Abs: 8385 {cells}/uL — ABNORMAL HIGH (ref 1500–7800)
Neutrophils Relative %: 74.2 %
Platelets: 257 10*3/uL (ref 140–400)
RBC: 5.42 10*6/uL (ref 4.20–5.80)
RDW: 12.4 % (ref 11.0–15.0)
Total Lymphocyte: 17.9 %
WBC: 11.3 10*3/uL — ABNORMAL HIGH (ref 3.8–10.8)

## 2023-01-13 LAB — COMPLETE METABOLIC PANEL WITH GFR
AG Ratio: 1.6 (calc) (ref 1.0–2.5)
ALT: 18 U/L (ref 9–46)
AST: 14 U/L (ref 10–35)
Albumin: 4.4 g/dL (ref 3.6–5.1)
Alkaline phosphatase (APISO): 103 U/L (ref 35–144)
BUN: 25 mg/dL (ref 7–25)
CO2: 26 mmol/L (ref 20–32)
Calcium: 9.5 mg/dL (ref 8.6–10.3)
Chloride: 103 mmol/L (ref 98–110)
Creat: 1.28 mg/dL (ref 0.70–1.35)
Globulin: 2.8 g/dL (ref 1.9–3.7)
Glucose, Bld: 161 mg/dL — ABNORMAL HIGH (ref 65–99)
Potassium: 4.4 mmol/L (ref 3.5–5.3)
Sodium: 138 mmol/L (ref 135–146)
Total Bilirubin: 0.4 mg/dL (ref 0.2–1.2)
Total Protein: 7.2 g/dL (ref 6.1–8.1)
eGFR: 61 mL/min/{1.73_m2} (ref >=60)

## 2023-01-13 LAB — URINALYSIS, ROUTINE W REFLEX MICROSCOPIC
Bilirubin Urine: NEGATIVE
Hyaline Cast: NONE SEEN /LPF
Ketones, ur: NEGATIVE
Nitrite: NEGATIVE
Specific Gravity, Urine: 1.043 — ABNORMAL HIGH (ref 1.001–1.035)
Squamous Epithelial / HPF: NONE SEEN /HPF (ref <=5)
pH: <=5 (ref 5.0–8.0)

## 2023-01-13 LAB — URINE CULTURE
MICRO NUMBER:: 15608936
SPECIMEN QUALITY:: ADEQUATE

## 2023-01-13 LAB — TEST AUTHORIZATION

## 2023-01-13 LAB — PSA: PSA: 1.91 ng/mL (ref <=4.00)

## 2023-01-13 LAB — MICROSCOPIC MESSAGE

## 2023-01-16 NOTE — Patient Instructions (Signed)

## 2023-01-25 ENCOUNTER — Encounter: Payer: Self-pay | Admitting: Nurse Practitioner

## 2023-02-20 ENCOUNTER — Ambulatory Visit: Payer: Medicare Other

## 2023-02-22 ENCOUNTER — Other Ambulatory Visit: Payer: Self-pay | Admitting: Nurse Practitioner

## 2023-02-22 DIAGNOSIS — I10 Essential (primary) hypertension: Secondary | ICD-10-CM

## 2023-02-26 NOTE — Progress Notes (Unsigned)
FOLLOW UP Assessment:   Mark [name] was seen today for medicare wellness and follow-up.  Diagnoses and all orders for this visit:  Essential hypertension Continue Telmisartan 20 mg every day  Discussed DASH (Dietary Approaches to Stop Hypertension) DASH diet is lower in sodium than a typical American diet. Cut back on foods that are high in saturated fat, cholesterol, and trans fats. Eat more whole-grain foods, fish, poultry, and nuts Remain active and exercise as tolerated daily.  Monitor BP at home-Call if greater than 130/80.  Check CMP/CBC  Type 2 diabetes mellitus with stage 2 chronic kidney disease, without long-term current use of insulin (HCC) Metformin 500 mg 2 tab daily with biggest meal and Glipizide 5 mg 1 tab bid with breakfast and dinner Stopped Jardiance on his own as he states he got up every hour to urinate on that medication Education: Reviewed 'ABCs' of diabetes management  Discussed goals to be met and/or maintained include A1C (<7) Blood pressure (<130/80) Cholesterol (LDL <70) Strongly encouraged to have Eye Exam yearly - declines Follow up in 3 months -     CBC with Differential/Platelet -     COMPLETE METABOLIC PANEL WITH GFR -     Hemoglobin A1C w/out eAG   Hyperlipidemia associated with type 2 diabetes mellitus (HCC) Discussed lifestyle modifications. Recommended diet heavy in fruits and veggies, omega 3's. Decrease consumption of animal meats, cheeses, and dairy products. Remain active and exercise as tolerated. Continue to monitor. Check lipids   CKD stage 2 due to type 2 diabetes mellitus (HCC) Discussed how what you eat and drink can aide in kidney protection. Stay well hydrated. Avoid high salt foods. Avoid NSAIDS. Keep BP and BG well controlled.   Take medications as prescribed. Remain active and exercise as tolerated daily. Maintain weight.  Continue to monitor. Check CMP   Gastroesophageal reflux disease, unspecified whether  esophagitis present No suspected reflux complications (Barret/stricture). Lifestyle modification:  wt loss, avoid meals 2-3h before bedtime. Consider eliminating food triggers:  chocolate, caffeine, EtOH, acid/spicy food. - Magnesium  Obesity (BMI 30.0-34.9) Discussed appropriate BMI Diet modification. Physical activity. Encouraged/praised to build confidence.   Testosterone deficiency Injection administered today. Continued perceived benefits with supplementation, no SE. Check level PRN, continue zinc, no symptoms of elevated estrogen or dihydrotestosterone.    Benign prostatic hyperplasia with urinary frequency Continue tamsulosin and cialis daily  Vitamin D deficiency Continue Vit D supplementation to maintain value in therapeutic level of 60-100  - Vit D  Medication management -     CBC with Differential/Platelet -     COMPLETE METABOLIC PANEL WITH GFR -     Magnesium -     Lipid panel -     Hemoglobin A1C w/out eAG -     Vitamin D       Over 30 minutes of exam, counseling, chart review, and critical decision making was performed  Future Appointments  Date Time Provider Department Center  08/03/2023  9:00 AM Mark Dick, NP [facility] None    Subjective:  Mark [name] is a 67 y.o. male who presents for general follow up for HTN, hyperlipidemia, T2DM, and vitamin D Def.   Overall he reports doing well.    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  Enjoying at this time.   BMI is Body mass index is 30.47 kg/m., he has been working on diet and exercise, goes to gym 45 min daily. He gained 7 pounds in past 6  weeks.  Wt Readings from Last 3 Encounters:  02/27/23 227 lb 12.8 oz (103.3 kg)  01/11/23 220 lb (99.8 kg)  12/20/22 219 lb (99.3 kg)   His blood pressure has been controlled at home with Telmisartan 20 mg qd, today their BP is BP: 132/68  BP Readings from Last 3 Encounters:  02/27/23 132/68  01/11/23 136/80  12/20/22 122/80    He does workout. He denies chest pain, shortness of breath, dizziness.    He is on cholesterol medication (rosuvastatin 20 mg daily) and denies myalgias. His cholesterol is not at goal. The cholesterol last visit was:   Lab Results  Component Value Date   CHOL 247 (H) 11/22/2022   HDL 40 11/22/2022   LDLCALC  11/22/2022     Comment:     . LDL cholesterol not calculated. Triglyceride levels greater than 400 mg/dL invalidate calculated LDL results. . Reference range: <100 . Desirable range <100 mg/dL for primary prevention;   <70 mg/dL for patients with CHD or diabetic patients  with > or = 2 CHD risk factors. Marland Kitchen LDL-C is now calculated using the Martin-Hopkins  calculation, which is a validated novel method providing  better accuracy than the Friedewald equation in the  estimation of LDL-C.  Horald Pollen et al. Lenox Ahr. 1610;960(45): 2061-2068  (http://education.[employer].com/faq/FAQ164)    TRIG 482 (H) 11/22/2022   CHOLHDL 6.2 (H) 11/22/2022   He has been working on diet and exercise for T2DM with CKD II, on Metformin 500 mg 2 tab daily with biggest meal , Glipizide 5 mg 2 tab bid for blood sugar greater than 120- has not been taking  Also on ASA and ARB and denies hyperglycemia, hypoglycemia , increased appetite, nausea, paresthesia of the feet, polydipsia, polyuria and visual disturbances.  He has not been taking his Jardiance because he states it makes him pee all night.  Has never had an eye exam- he has refused. Last A1C in the office was:  Lab Results  Component Value Date   HGBA1C 8.7 (H) 11/22/2022   Last GFR Lab Results  Component Value Date   EGFR 61 01/11/2023     Patient is on Vitamin D supplement, taking 5000 IU recently.  Lab Results  Component Value Date   VD25OH 23 (L) 11/02/2020     He has a history of testosterone deficiency and is on testosterone replacement.  He states that the testosterone helps with his energy, libido, muscle mass. Denies  symptoms of elevated estrogen or dihydrotestosterone.  He feels very fatigued when his testosterone runs out. Getting his injection today Lab Results  Component Value Date   TESTOSTERONE 316 11/02/2020   Also prescribed tamsulosin 0.4 mg daily for BPH, and cialis 5 mg every day. He gets up once a night to urinate.  Was getting up once an hour when he was on Jardiance  Medication Review:  Current Outpatient Medications (Endocrine & Metabolic):    glipiZIDE (GLUCOTROL) 5 MG tablet, Take  1/2 to 1 tablet   3 x /day  with Meals   for elevated blood sugars 150+.   metFORMIN (GLUCOPHAGE-XR) 500 MG 24 hr tablet, Take 1 tab daily with largest Meal for Diabetes.   testosterone cypionate (DEPOTESTOSTERONE CYPIONATE) 200 MG/ML injection, Inject 2 ml into muscle every 2 weeks   empagliflozin (JARDIANCE) 25 MG TABS tablet, Take 1 tablet (25 mg total) by mouth daily. (Patient not taking: Reported on 02/27/2023)  Current Outpatient Medications (Cardiovascular):    rosuvastatin (CRESTOR) 20 MG tablet, Take  1 tablet (20 mg total) by mouth daily.   tadalafil (CIALIS) 5 MG tablet, TAKE 1 TAB DAILY FOR PROSTATE.   telmisartan (MICARDIS) 20 MG tablet, Take 1 tablet by mouth once daily for blood pressure   Current Outpatient Medications (Analgesics):    aspirin 81 MG chewable tablet, Chew by mouth daily.  Current Outpatient Medications (Hematological):    Cyanocobalamin (VITAMIN B-12 PO), Take by mouth daily.  Current Outpatient Medications (Other):    blood glucose meter kit and supplies KIT, Dispense based on patient and insurance preference. Use up to four times daily as directed. (FOR ICD-9 250.00, 250.01).   Cholecalciferol 1000 units capsule, Take 10,000 Units by mouth daily.   glucose blood test strip, Check blood glucose before meals & bedtime time and as needed.Check blood glucose before meals & bedtime time and as needed.   tamsulosin (FLOMAX) 0.4 MG CAPS capsule, Take  1 capsule at Bedtime  for  Prostate   Allergies: Allergies  Allergen Reactions   Codeine     REACTION: Anxiety    Current Problems (verified) has Hyperlipidemia associated with type 2 diabetes mellitus (HCC); Hypertension; Type 2 diabetes mellitus (HCC); GERD (gastroesophageal reflux disease); Vitamin D deficiency; Testosterone deficiency; Obesity (BMI 30.0-34.9); Medication management; Benign prostatic hyperplasia with urinary frequency; CKD stage 2 due to type 2 diabetes mellitus (HCC); B12 deficiency; History of colon polyps; and COVID-19 (03/29/2020) on their problem list.  Screening Tests Immunization History  Administered Date(s) Administered   Td 03/27/2005   Tdap 08/09/2015    Names of Other Physician/Practitioners you currently use: 1. [facility] here for primary care 2. [facility], gets checked annually at DOT physical, last 2021, request report for diabetes eye  3. Dr. Gerilyn Pilgrim, dentist, last visit 2021, looking for new dentist  Patient Care Team: Lucky Cowboy, MD as PCP - General (Internal Medicine)  Surgical: He  has a past surgical history that includes Vasectomy (01/2004) and Urethral dilation (2007). Family His family history includes COPD in his father; Heart disease in his father; Hyperlipidemia in his mother; Hypertension in his father and mother. Social history  He reports that he has never smoked. He has never used smokeless tobacco. He reports that he does not drink alcohol and does not use drugs.   Objective:   Today's Vitals   02/27/23 1549  BP: 132/68  Pulse: 82  Temp: (!) 97.5 F (36.4 C)  SpO2: 96%  Weight: 227 lb 12.8 oz (103.3 kg)  Height: 6' 0.5" (1.842 m)     Review of Systems  Constitutional:  Negative for chills, fever and weight loss.  HENT:  Negative for congestion and hearing loss.   Eyes:  Negative for blurred vision and double vision.  Respiratory:  Negative for cough and shortness of breath.    Cardiovascular:  Negative for chest pain, palpitations, orthopnea and leg swelling.  Gastrointestinal:  Negative for abdominal pain, constipation, diarrhea, heartburn, nausea and vomiting.  Genitourinary:  Frequency: gets up 4-5 times a night.  Musculoskeletal:  Negative for falls, joint pain and myalgias.  Skin:  Negative for rash.  Neurological:  Negative for dizziness, tingling, tremors, loss of consciousness and headaches.  Psychiatric/Behavioral:  Negative for depression, memory loss and suicidal ideas.      Body mass index is 30.47 kg/m.  General appearance: alert, no distress, WD/WN, male HEENT: normocephalic, sclerae anicteric, TMs pearly, nares patent, no discharge or erythema, pharynx normal Oral cavity: MMM, no lesions Neck: supple, no  lymphadenopathy, no thyromegaly, no masses Heart: RRR, normal S1, S2, no murmurs Lungs: CTA bilaterally, no wheezes, rhonchi, or rales Abdomen: +bs, soft, non tender, non distended, no masses, no hepatomegaly, no splenomegaly Musculoskeletal: nontender, no swelling, no obvious deformity Extremities: no edema, no cyanosis, no clubbing Pulses: 2+ symmetric, upper and lower extremities, normal cap refill Neurological: alert, oriented x 3, CN2-12 intact, strength normal upper extremities and lower extremities, sensation normal throughout, DTRs 2+ throughout, no cerebellar signs, gait normal Psychiatric: normal affect, behavior normal, pleasant     Mark Dick, NP   02/27/2023

## 2023-02-27 ENCOUNTER — Encounter: Payer: Self-pay | Admitting: Nurse Practitioner

## 2023-02-27 ENCOUNTER — Ambulatory Visit (INDEPENDENT_AMBULATORY_CARE_PROVIDER_SITE_OTHER): Payer: Medicare Other | Admitting: Nurse Practitioner

## 2023-02-27 VITALS — BP 132/68 | HR 82 | Temp 97.5°F | Ht 72.5 in | Wt 227.8 lb

## 2023-02-27 DIAGNOSIS — Z79899 Other long term (current) drug therapy: Secondary | ICD-10-CM | POA: Diagnosis not present

## 2023-02-27 DIAGNOSIS — E559 Vitamin D deficiency, unspecified: Secondary | ICD-10-CM

## 2023-02-27 DIAGNOSIS — K219 Gastro-esophageal reflux disease without esophagitis: Secondary | ICD-10-CM | POA: Diagnosis not present

## 2023-02-27 DIAGNOSIS — N182 Chronic kidney disease, stage 2 (mild): Secondary | ICD-10-CM

## 2023-02-27 DIAGNOSIS — E785 Hyperlipidemia, unspecified: Secondary | ICD-10-CM

## 2023-02-27 DIAGNOSIS — I10 Essential (primary) hypertension: Secondary | ICD-10-CM | POA: Diagnosis not present

## 2023-02-27 DIAGNOSIS — E1122 Type 2 diabetes mellitus with diabetic chronic kidney disease: Secondary | ICD-10-CM

## 2023-02-27 DIAGNOSIS — E349 Endocrine disorder, unspecified: Secondary | ICD-10-CM

## 2023-02-27 DIAGNOSIS — R350 Frequency of micturition: Secondary | ICD-10-CM

## 2023-02-27 DIAGNOSIS — E1169 Type 2 diabetes mellitus with other specified complication: Secondary | ICD-10-CM | POA: Diagnosis not present

## 2023-02-27 DIAGNOSIS — E669 Obesity, unspecified: Secondary | ICD-10-CM

## 2023-02-27 DIAGNOSIS — N401 Enlarged prostate with lower urinary tract symptoms: Secondary | ICD-10-CM

## 2023-02-27 MED ORDER — TESTOSTERONE CYPIONATE 200 MG/ML IM SOLN
200.0000 mg | Freq: Once | INTRAMUSCULAR | Status: AC
  Administered 2023-02-27: 200 mg via INTRAMUSCULAR

## 2023-02-27 NOTE — Patient Instructions (Signed)

## 2023-02-27 NOTE — Progress Notes (Signed)
Patient presents to the office for a Testosterone Cypionate 100mg /ml 1 ml IM injection in LEFT outer quadrant. Patient tolerated well

## 2023-02-28 ENCOUNTER — Encounter: Payer: Self-pay | Admitting: Nurse Practitioner

## 2023-02-28 LAB — CBC WITH DIFFERENTIAL/PLATELET
Absolute Lymphocytes: 2394 {cells}/uL (ref 850–3900)
Absolute Monocytes: 595 {cells}/uL (ref 200–950)
Basophils Absolute: 42 {cells}/uL (ref 0–200)
Basophils Relative: 0.6 %
Eosinophils Absolute: 308 {cells}/uL (ref 15–500)
Eosinophils Relative: 4.4 %
HCT: 42.8 % (ref 38.5–50.0)
Hemoglobin: 14 g/dL (ref 13.2–17.1)
MCH: 29 pg (ref 27.0–33.0)
MCHC: 32.7 g/dL (ref 32.0–36.0)
MCV: 88.6 fL (ref 80.0–100.0)
MPV: 11.5 fL (ref 7.5–12.5)
Monocytes Relative: 8.5 %
Neutro Abs: 3661 {cells}/uL (ref 1500–7800)
Neutrophils Relative %: 52.3 %
Platelets: 238 10*3/uL (ref 140–400)
RBC: 4.83 10*6/uL (ref 4.20–5.80)
RDW: 11.8 % (ref 11.0–15.0)
Total Lymphocyte: 34.2 %
WBC: 7 10*3/uL (ref 3.8–10.8)

## 2023-02-28 LAB — COMPLETE METABOLIC PANEL WITH GFR
AG Ratio: 1.7 (calc) (ref 1.0–2.5)
ALT: 20 U/L (ref 9–46)
AST: 14 U/L (ref 10–35)
Albumin: 4.3 g/dL (ref 3.6–5.1)
Alkaline phosphatase (APISO): 108 U/L (ref 35–144)
BUN: 15 mg/dL (ref 7–25)
CO2: 30 mmol/L (ref 20–32)
Calcium: 9.4 mg/dL (ref 8.6–10.3)
Chloride: 100 mmol/L (ref 98–110)
Creat: 1.1 mg/dL (ref 0.70–1.35)
Globulin: 2.5 g/dL (ref 1.9–3.7)
Glucose, Bld: 319 mg/dL — ABNORMAL HIGH (ref 65–99)
Potassium: 4.3 mmol/L (ref 3.5–5.3)
Sodium: 137 mmol/L (ref 135–146)
Total Bilirubin: 0.3 mg/dL (ref 0.2–1.2)
Total Protein: 6.8 g/dL (ref 6.1–8.1)
eGFR: 74 mL/min/{1.73_m2} (ref >=60)

## 2023-02-28 LAB — HEMOGLOBIN A1C W/OUT EAG: Hgb A1c MFr Bld: 9.1 %{Hb} — ABNORMAL HIGH (ref <5.7)

## 2023-02-28 LAB — LIPID PANEL
Cholesterol: 192 mg/dL (ref <200)
HDL: 40 mg/dL (ref >=40)
LDL Cholesterol (Calc): 100 mg/dL — ABNORMAL HIGH
Non-HDL Cholesterol (Calc): 152 mg/dL — ABNORMAL HIGH (ref <130)
Total CHOL/HDL Ratio: 4.8 (calc) (ref <5.0)
Triglycerides: 387 mg/dL — ABNORMAL HIGH (ref <150)

## 2023-02-28 LAB — MAGNESIUM: Magnesium: 2.1 mg/dL (ref 1.5–2.5)

## 2023-02-28 LAB — VITAMIN D 25 HYDROXY (VIT D DEFICIENCY, FRACTURES): Vit D, 25-Hydroxy: 24 ng/mL — ABNORMAL LOW (ref 30–100)

## 2023-04-04 ENCOUNTER — Other Ambulatory Visit: Payer: Self-pay | Admitting: Nurse Practitioner

## 2023-04-04 DIAGNOSIS — E1122 Type 2 diabetes mellitus with diabetic chronic kidney disease: Secondary | ICD-10-CM

## 2023-04-19 ENCOUNTER — Other Ambulatory Visit: Payer: Self-pay | Admitting: Internal Medicine

## 2023-04-19 DIAGNOSIS — E1122 Type 2 diabetes mellitus with diabetic chronic kidney disease: Secondary | ICD-10-CM

## 2023-04-24 ENCOUNTER — Other Ambulatory Visit: Payer: Self-pay | Admitting: Nurse Practitioner

## 2023-04-24 DIAGNOSIS — I10 Essential (primary) hypertension: Secondary | ICD-10-CM

## 2023-05-08 ENCOUNTER — Other Ambulatory Visit: Payer: Self-pay

## 2023-05-08 DIAGNOSIS — N401 Enlarged prostate with lower urinary tract symptoms: Secondary | ICD-10-CM

## 2023-05-08 MED ORDER — TADALAFIL 5 MG PO TABS: ORAL_TABLET | ORAL | 1 refills | Status: AC

## 2023-05-30 ENCOUNTER — Ambulatory Visit: Payer: Medicare Other | Admitting: Nurse Practitioner

## 2023-05-31 ENCOUNTER — Other Ambulatory Visit: Payer: Self-pay

## 2023-05-31 MED ORDER — TAMSULOSIN HCL 0.4 MG PO CAPS: ORAL_CAPSULE | ORAL | 0 refills | Status: AC

## 2023-08-03 ENCOUNTER — Ambulatory Visit: Payer: Medicare Other | Admitting: Nurse Practitioner

## 2023-08-15 ENCOUNTER — Other Ambulatory Visit: Payer: Self-pay

## 2023-08-15 ENCOUNTER — Emergency Department (HOSPITAL_BASED_OUTPATIENT_CLINIC_OR_DEPARTMENT_OTHER): Admission: EM | Admit: 2023-08-15 | Discharge: 2023-08-15 | Disposition: A | Discharge status: Home / Self Care

## 2023-08-15 ENCOUNTER — Emergency Department (HOSPITAL_BASED_OUTPATIENT_CLINIC_OR_DEPARTMENT_OTHER)

## 2023-08-15 ENCOUNTER — Encounter (HOSPITAL_BASED_OUTPATIENT_CLINIC_OR_DEPARTMENT_OTHER): Payer: Self-pay | Admitting: Emergency Medicine

## 2023-08-15 DIAGNOSIS — R0602 Shortness of breath: Secondary | ICD-10-CM | POA: Insufficient documentation

## 2023-08-15 DIAGNOSIS — Z5321 Procedure and treatment not carried out due to patient leaving prior to being seen by health care provider: Secondary | ICD-10-CM | POA: Insufficient documentation

## 2023-08-15 NOTE — ED Triage Notes (Signed)
 Pt reports SHOB intermittently today; denies pain, does not correlate with activity

## 2023-08-26 ENCOUNTER — Other Ambulatory Visit: Payer: Self-pay | Admitting: Family

## 2023-08-26 DIAGNOSIS — N401 Enlarged prostate with lower urinary tract symptoms: Secondary | ICD-10-CM

## 2023-09-04 ENCOUNTER — Ambulatory Visit: Payer: Medicare Other | Admitting: Nurse Practitioner

## 2023-12-10 ENCOUNTER — Encounter: Payer: Medicare Other | Admitting: Internal Medicine
